# Patient Record
Sex: Male | Born: 1975 | Race: Black or African American | Hispanic: No | Marital: Single | Smoking: Never smoker
Health system: Southern US, Community
[De-identification: ages and names within clinical notes are randomized; demographics above are authoritative.]

## PROBLEM LIST (undated history)

## (undated) DIAGNOSIS — T7840XA Allergy, unspecified, initial encounter: Secondary | ICD-10-CM

## (undated) DIAGNOSIS — E119 Type 2 diabetes mellitus without complications: Secondary | ICD-10-CM

## (undated) DIAGNOSIS — F329 Major depressive disorder, single episode, unspecified: Secondary | ICD-10-CM

## (undated) DIAGNOSIS — F32A Depression, unspecified: Secondary | ICD-10-CM

## (undated) DIAGNOSIS — B20 Human immunodeficiency virus [HIV] disease: Secondary | ICD-10-CM

## (undated) DIAGNOSIS — D849 Immunodeficiency, unspecified: Secondary | ICD-10-CM

## (undated) HISTORY — PX: OTHER SURGICAL HISTORY: SHX169

## (undated) HISTORY — DX: Type 2 diabetes mellitus without complications: E11.9

## (undated) HISTORY — DX: Allergy, unspecified, initial encounter: T78.40XA

## (undated) HISTORY — DX: Major depressive disorder, single episode, unspecified: F32.9

## (undated) HISTORY — PX: GASTRIC ROUX-EN-Y: SHX5262

## (undated) HISTORY — DX: Depression, unspecified: F32.A

---

## 2003-02-27 ENCOUNTER — Emergency Department (HOSPITAL_COMMUNITY): Admission: EM | Admit: 2003-02-27 | Discharge: 2003-02-27 | Payer: Self-pay | Admitting: Emergency Medicine

## 2003-11-15 ENCOUNTER — Inpatient Hospital Stay (HOSPITAL_COMMUNITY): Admission: EM | Admit: 2003-11-15 | Discharge: 2003-11-19 | Payer: Self-pay | Admitting: Emergency Medicine

## 2003-11-17 ENCOUNTER — Encounter (INDEPENDENT_AMBULATORY_CARE_PROVIDER_SITE_OTHER): Payer: Self-pay | Admitting: *Deleted

## 2003-11-17 LAB — CONVERTED CEMR LAB
CD4 Count: 620 microliters
CD4 T Cell Abs: 620

## 2003-11-21 ENCOUNTER — Encounter: Admission: RE | Admit: 2003-11-21 | Discharge: 2003-11-21 | Payer: Self-pay | Admitting: Internal Medicine

## 2003-12-09 ENCOUNTER — Ambulatory Visit: Payer: Self-pay | Admitting: Internal Medicine

## 2003-12-09 ENCOUNTER — Ambulatory Visit (HOSPITAL_COMMUNITY): Admission: RE | Admit: 2003-12-09 | Discharge: 2003-12-09 | Payer: Self-pay | Admitting: Internal Medicine

## 2004-04-22 ENCOUNTER — Ambulatory Visit (HOSPITAL_COMMUNITY): Admission: RE | Admit: 2004-04-22 | Discharge: 2004-04-22 | Payer: Self-pay | Admitting: Internal Medicine

## 2004-04-22 ENCOUNTER — Ambulatory Visit: Payer: Self-pay | Admitting: Internal Medicine

## 2004-07-13 ENCOUNTER — Ambulatory Visit (HOSPITAL_COMMUNITY): Admission: RE | Admit: 2004-07-13 | Discharge: 2004-07-13 | Payer: Self-pay | Admitting: Internal Medicine

## 2004-07-13 ENCOUNTER — Ambulatory Visit: Payer: Self-pay | Admitting: Internal Medicine

## 2004-08-04 ENCOUNTER — Ambulatory Visit: Payer: Self-pay | Admitting: Internal Medicine

## 2004-10-12 ENCOUNTER — Ambulatory Visit: Payer: Self-pay | Admitting: Internal Medicine

## 2004-10-12 ENCOUNTER — Ambulatory Visit (HOSPITAL_COMMUNITY): Admission: RE | Admit: 2004-10-12 | Discharge: 2004-10-12 | Payer: Self-pay | Admitting: Internal Medicine

## 2004-10-27 ENCOUNTER — Ambulatory Visit: Payer: Self-pay | Admitting: Internal Medicine

## 2005-02-09 ENCOUNTER — Ambulatory Visit (HOSPITAL_COMMUNITY): Admission: RE | Admit: 2005-02-09 | Discharge: 2005-02-09 | Payer: Self-pay | Admitting: Internal Medicine

## 2005-02-09 ENCOUNTER — Ambulatory Visit: Payer: Self-pay | Admitting: Internal Medicine

## 2005-02-23 ENCOUNTER — Ambulatory Visit: Payer: Self-pay | Admitting: Internal Medicine

## 2005-06-15 ENCOUNTER — Encounter: Admission: RE | Admit: 2005-06-15 | Discharge: 2005-06-15 | Payer: Self-pay | Admitting: Internal Medicine

## 2005-06-15 ENCOUNTER — Ambulatory Visit: Payer: Self-pay | Admitting: Internal Medicine

## 2005-06-15 ENCOUNTER — Encounter (INDEPENDENT_AMBULATORY_CARE_PROVIDER_SITE_OTHER): Payer: Self-pay | Admitting: *Deleted

## 2005-07-06 ENCOUNTER — Ambulatory Visit: Payer: Self-pay | Admitting: Internal Medicine

## 2006-01-05 ENCOUNTER — Emergency Department (HOSPITAL_COMMUNITY): Admission: EM | Admit: 2006-01-05 | Discharge: 2006-01-05 | Payer: Self-pay | Admitting: Emergency Medicine

## 2006-01-11 ENCOUNTER — Encounter (INDEPENDENT_AMBULATORY_CARE_PROVIDER_SITE_OTHER): Payer: Self-pay | Admitting: *Deleted

## 2006-01-11 ENCOUNTER — Encounter: Admission: RE | Admit: 2006-01-11 | Discharge: 2006-01-11 | Payer: Self-pay | Admitting: Internal Medicine

## 2006-01-11 ENCOUNTER — Ambulatory Visit: Payer: Self-pay | Admitting: Internal Medicine

## 2006-01-11 ENCOUNTER — Emergency Department (HOSPITAL_COMMUNITY): Admission: EM | Admit: 2006-01-11 | Discharge: 2006-01-11 | Payer: Self-pay | Admitting: Family Medicine

## 2006-01-11 LAB — CONVERTED CEMR LAB
CD4 Count: 770 microliters
HIV 1 RNA Quant: 1030 copies/mL
HIV-1 RNA Quant, Log: 3.01 — ABNORMAL HIGH (ref <1.70)

## 2006-04-26 ENCOUNTER — Ambulatory Visit: Payer: Self-pay | Admitting: Internal Medicine

## 2006-04-26 ENCOUNTER — Encounter: Admission: RE | Admit: 2006-04-26 | Discharge: 2006-04-26 | Payer: Self-pay | Admitting: Internal Medicine

## 2006-04-26 ENCOUNTER — Encounter (INDEPENDENT_AMBULATORY_CARE_PROVIDER_SITE_OTHER): Payer: Self-pay | Admitting: *Deleted

## 2006-04-26 DIAGNOSIS — B20 Human immunodeficiency virus [HIV] disease: Secondary | ICD-10-CM

## 2006-04-26 DIAGNOSIS — A528 Late syphilis, latent: Secondary | ICD-10-CM

## 2006-04-26 DIAGNOSIS — J309 Allergic rhinitis, unspecified: Secondary | ICD-10-CM

## 2006-04-26 DIAGNOSIS — F640 Transsexualism: Secondary | ICD-10-CM

## 2006-04-26 DIAGNOSIS — F329 Major depressive disorder, single episode, unspecified: Secondary | ICD-10-CM

## 2006-04-26 DIAGNOSIS — R590 Localized enlarged lymph nodes: Secondary | ICD-10-CM | POA: Insufficient documentation

## 2006-04-26 LAB — CONVERTED CEMR LAB
AST: 31 units/L (ref 0–37)
BUN: 12 mg/dL (ref 6–23)
CO2: 27 meq/L (ref 19–32)
Calcium: 8.8 mg/dL (ref 8.4–10.5)
Chloride: 102 meq/L (ref 96–112)
Creatinine, Ser: 1.25 mg/dL (ref 0.40–1.50)
Glucose, Bld: 118 mg/dL — ABNORMAL HIGH (ref 70–99)
HCT: 45 % (ref 39.0–52.0)
HIV 1 RNA Quant: 947 copies/mL — ABNORMAL HIGH (ref <50)
HIV-1 RNA Quant, Log: 2.98 — ABNORMAL HIGH (ref <1.70)
Hemoglobin: 15 g/dL (ref 13.0–17.0)
RDW: 13.9 % (ref 11.5–14.0)

## 2006-05-10 ENCOUNTER — Encounter: Payer: Self-pay | Admitting: Internal Medicine

## 2006-05-23 ENCOUNTER — Encounter (INDEPENDENT_AMBULATORY_CARE_PROVIDER_SITE_OTHER): Payer: Self-pay | Admitting: *Deleted

## 2006-05-23 LAB — CONVERTED CEMR LAB

## 2006-06-05 ENCOUNTER — Encounter (INDEPENDENT_AMBULATORY_CARE_PROVIDER_SITE_OTHER): Payer: Self-pay | Admitting: *Deleted

## 2006-10-03 ENCOUNTER — Ambulatory Visit: Payer: Self-pay | Admitting: Internal Medicine

## 2006-10-03 ENCOUNTER — Encounter: Admission: RE | Admit: 2006-10-03 | Discharge: 2006-10-03 | Payer: Self-pay | Admitting: Internal Medicine

## 2006-10-03 LAB — CONVERTED CEMR LAB
ALT: 9 units/L (ref 0–53)
Albumin: 3.3 g/dL — ABNORMAL LOW (ref 3.5–5.2)
Alkaline Phosphatase: 74 units/L (ref 39–117)
Basophils Absolute: 0 10*3/uL (ref 0.0–0.1)
CO2: 24 meq/L (ref 19–32)
Glucose, Bld: 96 mg/dL (ref 70–99)
HIV-1 RNA Quant, Log: 3.18 — ABNORMAL HIGH (ref <1.70)
Lymphocytes Relative: 57 % — ABNORMAL HIGH (ref 12–46)
Lymphs Abs: 4.1 10*3/uL — ABNORMAL HIGH (ref 0.7–3.3)
Neutrophils Relative %: 31 % — ABNORMAL LOW (ref 43–77)
Platelets: 316 10*3/uL (ref 150–400)
Potassium: 4.6 meq/L (ref 3.5–5.3)
Sodium: 139 meq/L (ref 135–145)
Total Protein: 8.1 g/dL (ref 6.0–8.3)
WBC: 7.2 10*3/uL (ref 4.0–10.5)

## 2006-10-04 ENCOUNTER — Encounter: Payer: Self-pay | Admitting: Internal Medicine

## 2007-03-28 ENCOUNTER — Encounter: Payer: Self-pay | Admitting: Internal Medicine

## 2007-05-03 ENCOUNTER — Encounter (INDEPENDENT_AMBULATORY_CARE_PROVIDER_SITE_OTHER): Payer: Self-pay | Admitting: *Deleted

## 2007-10-24 ENCOUNTER — Encounter: Admission: RE | Admit: 2007-10-24 | Discharge: 2007-10-24 | Payer: Self-pay | Admitting: Internal Medicine

## 2007-10-24 ENCOUNTER — Ambulatory Visit: Payer: Self-pay | Admitting: Internal Medicine

## 2007-10-24 LAB — CONVERTED CEMR LAB
ALT: 12 units/L (ref 0–53)
AST: 35 units/L (ref 0–37)
Alkaline Phosphatase: 71 units/L (ref 39–117)
BUN: 13 mg/dL (ref 6–23)
Basophils Absolute: 0 10*3/uL (ref 0.0–0.1)
Basophils Relative: 0 % (ref 0–1)
Chloride: 103 meq/L (ref 96–112)
Creatinine, Ser: 1.13 mg/dL (ref 0.40–1.50)
Eosinophils Absolute: 0.1 10*3/uL (ref 0.0–0.7)
Eosinophils Relative: 1 % (ref 0–5)
Hemoglobin: 15.3 g/dL (ref 13.0–17.0)
MCHC: 35.3 g/dL (ref 30.0–36.0)
MCV: 91 fL (ref 78.0–100.0)
Monocytes Absolute: 0.7 10*3/uL (ref 0.1–1.0)
Monocytes Relative: 10 % (ref 3–12)
Neutro Abs: 2.2 10*3/uL (ref 1.7–7.7)
RDW: 13.9 % (ref 11.5–15.5)
Total Bilirubin: 0.4 mg/dL (ref 0.3–1.2)

## 2007-11-07 ENCOUNTER — Ambulatory Visit: Payer: Self-pay | Admitting: Internal Medicine

## 2007-11-07 LAB — CONVERTED CEMR LAB: Hep B S Ab: POSITIVE — AB

## 2008-04-25 ENCOUNTER — Ambulatory Visit: Payer: Self-pay | Admitting: Internal Medicine

## 2008-04-25 LAB — CONVERTED CEMR LAB
ALT: 11 units/L (ref 0–53)
Basophils Absolute: 0 10*3/uL (ref 0.0–0.1)
CO2: 24 meq/L (ref 19–32)
Calcium: 8.6 mg/dL (ref 8.4–10.5)
Chloride: 103 meq/L (ref 96–112)
Creatinine, Ser: 1.13 mg/dL (ref 0.40–1.50)
Eosinophils Relative: 1 % (ref 0–5)
Glucose, Bld: 98 mg/dL (ref 70–99)
HCT: 43.7 % (ref 39.0–52.0)
HIV 1 RNA Quant: 3840 copies/mL — ABNORMAL HIGH (ref <48)
Hemoglobin: 14.4 g/dL (ref 13.0–17.0)
Lymphocytes Relative: 53 % — ABNORMAL HIGH (ref 12–46)
Lymphs Abs: 3.9 10*3/uL (ref 0.7–4.0)
Monocytes Absolute: 0.8 10*3/uL (ref 0.1–1.0)
Neutro Abs: 2.5 10*3/uL (ref 1.7–7.7)
RBC: 4.71 M/uL (ref 4.22–5.81)
RDW: 14.2 % (ref 11.5–15.5)
Total Bilirubin: 0.3 mg/dL (ref 0.3–1.2)
Total Protein: 8.1 g/dL (ref 6.0–8.3)
WBC: 7.3 10*3/uL (ref 4.0–10.5)

## 2008-05-09 ENCOUNTER — Ambulatory Visit: Payer: Self-pay | Admitting: Internal Medicine

## 2008-05-15 ENCOUNTER — Encounter: Payer: Self-pay | Admitting: Internal Medicine

## 2008-08-07 ENCOUNTER — Ambulatory Visit: Payer: Self-pay | Admitting: Internal Medicine

## 2008-08-10 ENCOUNTER — Emergency Department (HOSPITAL_COMMUNITY): Admission: EM | Admit: 2008-08-10 | Discharge: 2008-08-10 | Payer: Self-pay | Admitting: Emergency Medicine

## 2008-08-14 ENCOUNTER — Emergency Department (HOSPITAL_COMMUNITY): Admission: EM | Admit: 2008-08-14 | Discharge: 2008-08-14 | Payer: Self-pay | Admitting: Emergency Medicine

## 2008-08-20 ENCOUNTER — Telehealth (INDEPENDENT_AMBULATORY_CARE_PROVIDER_SITE_OTHER): Payer: Self-pay | Admitting: *Deleted

## 2008-08-25 ENCOUNTER — Emergency Department (HOSPITAL_COMMUNITY): Admission: EM | Admit: 2008-08-25 | Discharge: 2008-08-25 | Payer: Self-pay | Admitting: Emergency Medicine

## 2008-08-27 ENCOUNTER — Ambulatory Visit: Payer: Self-pay | Admitting: Internal Medicine

## 2008-08-27 DIAGNOSIS — K130 Diseases of lips: Secondary | ICD-10-CM | POA: Insufficient documentation

## 2008-08-28 ENCOUNTER — Encounter: Payer: Self-pay | Admitting: Internal Medicine

## 2008-09-03 ENCOUNTER — Encounter: Payer: Self-pay | Admitting: Internal Medicine

## 2008-09-16 ENCOUNTER — Encounter: Payer: Self-pay | Admitting: Internal Medicine

## 2008-10-04 ENCOUNTER — Encounter: Payer: Self-pay | Admitting: Internal Medicine

## 2008-11-01 ENCOUNTER — Encounter: Payer: Self-pay | Admitting: Internal Medicine

## 2008-11-18 ENCOUNTER — Encounter (INDEPENDENT_AMBULATORY_CARE_PROVIDER_SITE_OTHER): Payer: Self-pay | Admitting: Otolaryngology

## 2008-11-18 ENCOUNTER — Ambulatory Visit (HOSPITAL_BASED_OUTPATIENT_CLINIC_OR_DEPARTMENT_OTHER): Admission: RE | Admit: 2008-11-18 | Discharge: 2008-11-18 | Payer: Self-pay | Admitting: Otolaryngology

## 2008-11-27 ENCOUNTER — Encounter: Payer: Self-pay | Admitting: Internal Medicine

## 2009-02-17 ENCOUNTER — Encounter: Payer: Self-pay | Admitting: Internal Medicine

## 2009-02-25 ENCOUNTER — Ambulatory Visit: Payer: Self-pay | Admitting: Internal Medicine

## 2009-04-22 ENCOUNTER — Ambulatory Visit: Payer: Self-pay | Admitting: Internal Medicine

## 2009-09-22 ENCOUNTER — Encounter: Payer: Self-pay | Admitting: Internal Medicine

## 2009-09-22 ENCOUNTER — Ambulatory Visit: Payer: Self-pay | Admitting: Internal Medicine

## 2009-09-22 LAB — CONVERTED CEMR LAB
HIV 1 RNA Quant: 767 copies/mL — ABNORMAL HIGH (ref <48)
HIV-1 RNA Quant, Log: 2.88 — ABNORMAL HIGH (ref <1.68)

## 2009-10-14 ENCOUNTER — Ambulatory Visit: Payer: Self-pay | Admitting: Internal Medicine

## 2009-10-14 ENCOUNTER — Encounter: Payer: Self-pay | Admitting: Internal Medicine

## 2009-10-14 DIAGNOSIS — R21 Rash and other nonspecific skin eruption: Secondary | ICD-10-CM

## 2009-10-14 DIAGNOSIS — D239 Other benign neoplasm of skin, unspecified: Secondary | ICD-10-CM | POA: Insufficient documentation

## 2009-10-17 ENCOUNTER — Encounter: Payer: Self-pay | Admitting: Internal Medicine

## 2009-10-27 ENCOUNTER — Encounter: Payer: Self-pay | Admitting: Internal Medicine

## 2009-11-17 ENCOUNTER — Telehealth: Payer: Self-pay | Admitting: Internal Medicine

## 2010-03-10 ENCOUNTER — Encounter: Payer: Self-pay | Admitting: Internal Medicine

## 2010-04-09 ENCOUNTER — Ambulatory Visit: Admit: 2010-04-09 | Payer: Self-pay | Admitting: Internal Medicine
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
## 2010-04-28 NOTE — Miscellaneous (Signed)
[facility]: [facility]   Imported By: Shon Hough 10/28/2009 15:29:57  _____________________________________________________________________  External Attachment:    Type:   Image     Comment:   External Document

## 2010-04-28 NOTE — Progress Notes (Signed)
Summary: rash/TY  Phone Note Call from Patient   Caller: Patient Call For: Dr. Cliffton Asters Summary of Call: Patient called stating that he has rash in groin area, itching and redness. Patient states he was seen for this before but symptoms has worsened. Initial call taken by: Starleen Arms CMA,  November 17, 2009 10:40 AM  Follow-up for Phone Call        Please ask him if the rash improved with Tinactin last month. If so then have him restart it. Follow-up by: Cliffton Asters MD,  November 18, 2009 9:14 AM

## 2010-04-28 NOTE — Assessment & Plan Note (Signed)
Summary: F/U/VS   CC:  follow-up visit and to see Research RN about START.  History of Present Illness: Robert Dawson is in for her routine visit.  She says that she has been bothered by the mole under her right breast and feels like it has gotten larger. It becomes irritated by her bra. She has also been bothered by an itchy rash on her scrotum for the last 3 weeks.  She says that she's never had anything like that before.  She is sexually active but only has one stable partner and says they always use condoms.  Preventive Screening-Counseling & Management  Alcohol-Tobacco     Alcohol drinks/day: 0     Smoking Status: never     Passive Smoke Exposure: yes  Caffeine-Diet-Exercise     Caffeine use/day: yes     Does Patient Exercise: no  Hep-HIV-STD-Contraception     HIV Risk: no risk noted  Safety-Violence-Falls     Seat Belt Use: yes  Comments: given condoms      Sexual History:  currently monogamous.        Drug Use:  never.     Prior Medication List:  TRAZODONE HCL 100 MG  TABS (TRAZODONE HCL) Take 1 tablet by mouth at bedtime ZOLOFT 100 MG TABS (SERTRALINE HCL) Take 1 tablet by mouth once a day ABILIFY 10 MG TABS (ARIPIPRAZOLE) Take 1 tablet by mouth once a day   Current Allergies (reviewed today): No known allergies  Social History: Sexual History:  currently monogamous  Vital Signs:  Patient profile:   35 year old male Height:      69 inches (175.26 cm) Weight:      260 pounds (118.18 kg) BMI:     38.53 Temp:     98.4 degrees F oral Pulse rate:   86 / minute BP sitting:   129 / 85  (left arm) Cuff size:   large  Vitals Entered By: Jennet Maduro RN (October 14, 2009 9:39 AM) CC: follow-up visit, to see Research RN about START Is Patient Diabetic? No Pain Assessment Patient in pain? no      Nutritional Status BMI of > 30 = obese Nutritional Status Detail appetite "OK"  Have you ever been in a relationship where you felt threatened, hurt or  afraid?No   Does patient need assistance? Functional Status Self care Ambulation Normal   Physical Exam  General:  alert and overweight-appearing.   Mouth:  pharynx pink and moist, no erythema, no exudates, and fair dentition.   Breasts:  she has a raised fleshy dark mole about 10 mm in diameter under her right breast.  There is a small punctate satellite lesion at the one o'clock position.  I am uncertain if this has changed in size. Lungs:  normal breath sounds, no crackles, and no wheezes.   Heart:  normal rate, regular rhythm, and no murmur.   Abdomen:  soft, non-tender, and normal bowel sounds.  obese. Genitalia:  there are diffuse scrotal superficial ulcers that appear to have been excoriated.  They all seem to be in the same stage of evolution. Psych:  normally interactive, good eye contact, not anxious appearing, and not depressed appearing.             Prevention For Positives: 10/14/2009   Safe sex practices discussed with patient. Condoms offered.  Impression & Recommendations:  Problem # 1:  HIV DISEASE (ICD-042) She has asymptomatic HIV infection with a normal CD4 count and low viral load.  I will have her talk with Deirdre Evener our research coordinator about the start study. Diagnostics Reviewed:  HIV: HIV positive - not AIDS (11/07/2007)   CD4: 570 (09/23/2009)   WBC: 7.3 (04/25/2008)   Hgb: 14.4 (04/25/2008)   HCT: 43.7 (04/25/2008)   Platelets: 264 (04/25/2008) HIV-1 RNA: 767 (09/22/2009)   HBSAg: NO (05/23/2006)  Problem # 2:  SKIN RASH (ICD-782.1) I have obtained a swab culture of the ulcers today for herpes simplex but I suspect that this is probably not herpes given the fairly diffuse bilateral nature of the rash.  This may be tinea cruris and I will treat for that empirically pending the herpes culture. His updated medication list for this problem includes:    Tinactin 1 % Powd (Tolnaftate)  Orders: Est. Patient Level IV  (87564) Dermatology Referral (Derma) T-Culture, Herpes (Routine) ([id number])  Problem # 3:  MOLE (ICD-216.9) I will see if we can arrange a dermatology referral today to consider excisional biopsy of the mole. Orders: Est. Patient Level IV (66063) Dermatology Referral (Derma)  Medications Added to Medication List This Visit: 1)  Tinactin 1 % Powd (Tolnaftate)  Other Orders: Future Orders: T-CD4SP ([facility]) (CD4SP) ... 04/12/2010 T-HIV Viral Load [phone]) ... 04/12/2010 T-Comprehensive Metabolic Panel [phone]) ... 04/12/2010 T-CBC w/Diff ([id number]) ... 04/12/2010 T-RPR (Syphilis) [phone]) ... 04/12/2010 T-Lipid Profile [phone]) ... 04/12/2010  Patient Instructions: 1)  Please schedule a follow-up appointment in 6 months.

## 2010-04-30 NOTE — Miscellaneous (Signed)
  Clinical Lists Changes  Orders: Added new Test order of T-CBC w/Diff [phone]) - Signed Added new Test order of T-CD4SP [facility]) (CD4SP) - Signed Added new Test order of T-Comprehensive Metabolic Panel [phone]) - Signed Added new Test order of T-HIV Viral Load [phone]) - Signed     Process Orders Check Orders Results:     [facility]: Check successful Tests Sent for requisitioning (March 10, 2010 12:03 PM):     04/09/2010: [facility] -- T-CBC w/Diff [[id number]] (signed)     04/09/2010: [facility] -- T-Comprehensive Metabolic Panel [[id number]] (signed)     04/09/2010: [facility] -- T-HIV Viral Load [phone] (signed)

## 2010-06-04 ENCOUNTER — Encounter: Payer: Self-pay | Admitting: Internal Medicine

## 2010-06-09 ENCOUNTER — Encounter: Payer: Self-pay | Admitting: Internal Medicine

## 2010-06-14 LAB — T-HELPER CELL (CD4) - (RCID CLINIC ONLY): CD4 % Helper T Cell: 18 % — ABNORMAL LOW (ref 33–55)

## 2010-06-16 ENCOUNTER — Other Ambulatory Visit: Payer: Self-pay | Admitting: Internal Medicine

## 2010-06-16 ENCOUNTER — Other Ambulatory Visit: Payer: Medicare Other

## 2010-06-16 ENCOUNTER — Ambulatory Visit (INDEPENDENT_AMBULATORY_CARE_PROVIDER_SITE_OTHER): Payer: Medicare Other | Admitting: Internal Medicine

## 2010-06-16 DIAGNOSIS — Z23 Encounter for immunization: Secondary | ICD-10-CM

## 2010-06-16 DIAGNOSIS — B20 Human immunodeficiency virus [HIV] disease: Secondary | ICD-10-CM

## 2010-06-16 LAB — CBC WITH DIFFERENTIAL/PLATELET
Eosinophils Relative: 6 % — ABNORMAL HIGH (ref 0–5)
HCT: 39.2 % (ref 39.0–52.0)
Hemoglobin: 13.2 g/dL (ref 13.0–17.0)
Lymphocytes Relative: 40 % (ref 12–46)
Lymphs Abs: 3.2 10*3/uL (ref 0.7–4.0)
MCV: 90.7 fL (ref 78.0–100.0)
Monocytes Absolute: 0.7 10*3/uL (ref 0.1–1.0)
Monocytes Relative: 9 % (ref 3–12)
Platelets: 258 10*3/uL (ref 150–400)
RBC: 4.32 MIL/uL (ref 4.22–5.81)
WBC: 7.9 10*3/uL (ref 4.0–10.5)

## 2010-06-16 MED ORDER — INFLUENZA VAC TYP A&B SURF ANT IM INJ
0.5000 mL | INJECTION | Freq: Once | INTRAMUSCULAR | Status: AC
  Administered 2010-06-16: 0.5 mL via INTRAMUSCULAR

## 2010-06-17 LAB — T-HELPER CELL (CD4) - (RCID CLINIC ONLY)
CD4 % Helper T Cell: 21 % — ABNORMAL LOW (ref 33–55)
CD4 T Cell Abs: 680 uL (ref 400–2700)

## 2010-06-17 LAB — COMPLETE METABOLIC PANEL WITH GFR
ALT: 10 U/L (ref 0–53)
AST: 29 U/L (ref 0–37)
Alkaline Phosphatase: 77 U/L (ref 39–117)
BUN: 10 mg/dL (ref 6–23)
Creat: 1.03 mg/dL (ref 0.40–1.50)
Total Bilirubin: 0.3 mg/dL (ref 0.3–1.2)

## 2010-06-18 LAB — HIV-1 RNA QUANT-NO REFLEX-BLD
HIV 1 RNA Quant: 2440 copies/mL — ABNORMAL HIGH (ref <20)
HIV-1 RNA Quant, Log: 3.39 {Log} — ABNORMAL HIGH (ref <1.30)

## 2010-06-19 NOTE — Progress Notes (Signed)
PT here for vaccination only. Visit encounter created in error.

## 2010-06-30 ENCOUNTER — Ambulatory Visit: Payer: Self-pay | Admitting: Internal Medicine

## 2010-07-01 LAB — T-HELPER CELL (CD4) - (RCID CLINIC ONLY)
CD4 % Helper T Cell: 18 % — ABNORMAL LOW (ref 33–55)
CD4 T Cell Abs: 530 uL (ref 400–2700)

## 2010-07-04 LAB — POCT HEMOGLOBIN-HEMACUE: Hemoglobin: 14.8 g/dL (ref 13.0–17.0)

## 2010-08-11 NOTE — Op Note (Signed)
NAMESHOUA, RESSLER            ACCOUNT NO.:  [id number]   MEDICAL RECORD NO.:  [id number]          PATIENT TYPE:  AMB   LOCATION:  DSC                          FACILITY:  [facility]   PHYSICIAN:  Karol T. Lazarus Salines, M.D. DATE OF BIRTH:  November 20, 1975   DATE OF PROCEDURE:  11/18/2008  DATE OF DISCHARGE:                               OPERATIVE REPORT   PREOPERATIVE DIAGNOSIS:  Right lower lip foreign body granuloma.   POSTOPERATIVE DIAGNOSIS:  Right lower lip foreign body granuloma.   PROCEDURE PERFORMED:  Excision, right lower lip lesion with cosmetic  reconstruction.   SURGEON:  Gloris Manchester. Wolicki, MD   ANESTHESIA:  General nasotracheal.   BLOOD LOSS:  Minimal.   COMPLICATIONS:  None.   FINDINGS:  Irregular submucosal fullness beginning just behind the  vermilion of the lip and tracking down to the alveolus angling down  towards the mental nerve on the right side and with the area of firmness  approximately 5 cm long x 2 cm wide x 1 cm thick.  This was submucosal  but deep to the orbicularis oris muscle.   PROCEDURE:  With the patient in a comfortable supine position, general  nasotracheal anesthesia was induced without difficulty.  At an  appropriate level, the patient was placed in a slight sitting position.  The lesion was examined and 1% Xylocaine with 1:100,000 epinephrine, 9  mL total was infiltrated in a ring block fashion.  Several minutes were  allowed for this to take effect.  A sterile preparation and draping of  the mouth and neck was performed.   The lesion was again examined.  It was elected to wedge out part of the  visible vermilion of the lip and then to remove the submucosal lesion  with a 1-cm wide strip of mucosa down to its entire palpable extent.  The mucosa was incised, and the dissection was carried out lateral to  the lesion sharply.  The lesion was circumcised using cautery and sharp  scalpel with minimal margin but was removed in its entirety.  It was  passed off as a specimen.  A small amount of cautery was required for  hemostasis.  At this point, the V excision on the vermilion was enlarged  slightly to prevent too much of a dog ear.  Hemostasis was again  observed after irrigating the mouth and suctioning this clear.  The  closure was begun internally with 3-0 chromic taking care to grab some  the deep tissue to obliterate the dead space.  This was brought up onto  the anterior surface of the lip.  The external surface of the lip was  closed with interrupted 5-0 Ethilon in the visible portion.  Working  further posteriorly, the remainder of the vermilion was closed with  interrupted 4-0 chromic sutures in a vertical mattress fashion.  The  remainder of the internal incision was closed a 3-0 chromic in a  vertical mattress fashion.  A good reapproximation of the lip with  slight dog ear at the superior and anterior surface was accomplished.  Hemostasis was again observed.  The oral cavity was irrigated,  and the  pharynx was carefully suctioned clear.  At this point, the procedure was  completed.  The patient was returned to Anesthesia, awakened, extubated,  and transferred to recovery in stable condition.   COMMENT:  A 35 year old transgender male received liquid silicone  injections to enhance his chin and developed asymmetry and a hard  presumed foreign body granuloma/fibrosis of the right lower lip, hence  the indication for today's procedure.  Anticipate a routine  postoperative recovery with attention to ice, elevation, analgesia.      Gloris Manchester. Lazarus Salines, M.D.  Electronically Signed     KTW/MEDQ  D:  11/18/2008  T:  11/19/2008  Job:  [id number]   cc:   [facility].

## 2010-08-14 NOTE — Discharge Summary (Signed)
NAMEFREDERIC, Robert Dawson            ACCOUNT NO.:  [id number]   MEDICAL RECORD NO.:  [id number]          PATIENT TYPE:  INP   LOCATION:  0480                         FACILITY:  [facility]   PHYSICIAN:  Fleet Contras, M.D.    DATE OF BIRTH:  July 16, 1975   DATE OF ADMISSION:  11/15/2003  DATE OF DISCHARGE:  11/19/2003                                 DISCHARGE SUMMARY   PRESENTING COMPLAINT:  Urinary urgency, hesitancy, and dysuria for 4 days.   HISTORY OF PRESENTING COMPLAINT:  [name] is a pleasant 35 year old  gentleman, who presented to the emergency room at [facility] with  4 days of urinary urgency, hesitancy, and dysuria.  This had been  progressive, but he denied any fever or chills.  He did have some night  sweats and anorexia.  He has lost about 5 pounds of weight in the last few  months.  He had no abdominal pain, vomiting, diarrhea, or constipation.  He  denied any urethral discharge or scrotal pain.  On evaluation in the  emergency room, he was found to have multiple swollen inguinal lymph nodes,  and a CT scan of the abdomen and pelvis showed some intra-abdominal lymph  nodes and a liver lesion.  He could not pass urine.   PHYSICAL EXAMINATION:  GENERAL:  On examination, he was in some acute  painful distress.  He has palpable axillary small submandibular and matted  bilateral inguinal lymph nodes.  VITAL SIGNS:  His blood pressure is 107/63.  Heart rate is 96.  Temperature  98.1.  respiratory rate 18.  RECTAL:  Exquisitely tender prostate which was not enlarged with no  testicular masses.   Initial laboratory studies showed a white count of 11.4, hemoglobin 15,  platelet count 238.  He had 62% of lymphocytes and 24% of polys.  Peripheral  smear showed teardrops, __________ cells and __________ lymphocytes.  The  electrolytes showed a potassium of 4.0, sodium 133.  BUN was 10 and  creatinine 1.4.  His albumin was 3.0.  Urinalysis was positive for protein,  few  bacteria, and 0-2 WBC.  He was thought to have acute prostatitis, to  rule out some socially transmitted infection and also generalized  lymphadenopathy with lymphocytosis and atypical lymphocytes.  Further  testing included RPR which showed 1-2 and positive TPPA.  GC and Chlamydia  were negative.  LDH was elevated at 426.  An infectious disease consult was  requested, and patient was seen by Dr. Ninetta Lights, who recommended outpatient  treatment for syphilis with intramuscular penicillin-G 2.4 million units  weekly x 3 doses.  HIV test also came back positive.  For this, he was  counseled extensively.  A skin test for TB was negative.  Consideration was  given for lymph node biopsy prior to the report of HIV test results  received.  It was concluded that the lymphadenopathy was most likely due to  acute HIV infection, and lymph node biopsy was cancelled.   ADMISSION DIAGNOSES:  1.  Acute prostatitis/urinary tract infection.  2.  Generalized lymphadenopathy.  3.  Transvestite.   DISCHARGE DIAGNOSES:  1.  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  2.  Latent syphilis.  3.  Generalized lymphadenopathy.  4.  Transvestism.   CONDITION ON DISCHARGE:  Stable.   DISPOSITION:  To home.   FOLLOW UP:  With Dr. Fleet Contras, [phone] on November 25, 2003 and with  infectious disease clinic at [facility] in 1-2 weeks.  He is to  receive __________ penicillin 2.4 million units weekly x 2 more doses.      EA/MEDQ  D:  12/28/2003  T:  12/29/2003  Job:  [id number]

## 2010-08-31 ENCOUNTER — Ambulatory Visit (INDEPENDENT_AMBULATORY_CARE_PROVIDER_SITE_OTHER): Payer: Medicare Other | Admitting: Internal Medicine

## 2010-08-31 ENCOUNTER — Encounter: Payer: Self-pay | Admitting: Internal Medicine

## 2010-08-31 ENCOUNTER — Ambulatory Visit: Payer: Medicare Other | Admitting: Internal Medicine

## 2010-08-31 DIAGNOSIS — G629 Polyneuropathy, unspecified: Secondary | ICD-10-CM | POA: Insufficient documentation

## 2010-08-31 DIAGNOSIS — R21 Rash and other nonspecific skin eruption: Secondary | ICD-10-CM

## 2010-08-31 DIAGNOSIS — G609 Hereditary and idiopathic neuropathy, unspecified: Secondary | ICD-10-CM

## 2010-08-31 DIAGNOSIS — B20 Human immunodeficiency virus [HIV] disease: Secondary | ICD-10-CM

## 2010-08-31 MED ORDER — GABAPENTIN 100 MG PO CAPS: 100.0000 mg | ORAL_CAPSULE | Freq: Three times a day (TID) | ORAL | Status: DC

## 2010-08-31 NOTE — Assessment & Plan Note (Signed)
I am not absolutely sure what is causing her leg pain she does have a little bit of a chronic numbness and tingling. I have told her that it is very dangerous for her to share medications particularly prescription narcotics. I will try her on low-dose Neurontin to see if it helps.

## 2010-08-31 NOTE — Progress Notes (Signed)
  Subjective:    Patient ID: Robert Dawson, male    DOB: [birth year], 35 y.o.   MRN: [id number]  HPI Avenir history for her routine visit. She has not changed any of her medications. After her last visit she saw a local dermatologist and had the mole under her right breast removed. She cannot remember the dermatologist name. She believes it was benign. The scrotal rash resolved with topical antifungal therapy. She has recently developed several lesions on her left palm but feels like they're starting to go away.  For her since she had silicone injections in her hips she has had some discomfort and heaviness that she says effects her walking. She says she's tried over-the-counter pain medications without relief. She has taken whenever girlfriend is oxycodone tablets on occasion and says that makes her feel much better.  After her last visit she did talk to Deirdre Evener, prior study treatment coordinator, but did not feel that she was ready to start antiretroviral therapy. She says that she does think about starting it so that she doesn't wait until she gets sick but is still not sure she is ready to start.    Review of Systems     Objective:   Physical Exam  Constitutional: No distress.  HENT:  Mouth/Throat: Oropharynx is clear and moist. No oropharyngeal exudate.  Cardiovascular: Normal rate, regular rhythm and normal heart sounds.   No murmur heard. Pulmonary/Chest: Breath sounds normal. He has no wheezes.  Neurological:       Examination of her feet and lower legs is unremarkable. She has 3+ pulses in her feet. She has no swelling and no pain on palpation. Her gait is normal. Her sensation appears to be intact to light touch.  Skin:       There are 5 skin lesions clustered at the base of her left palm. They are raised, rough and firm and have the appearance of warts.          Assessment & Plan:

## 2010-08-31 NOTE — Assessment & Plan Note (Signed)
Her CD4 count remains normal and her viral load remains relatively low and stable. However I reviewed with her the recent data that suggests that treating everyone with HIV regardless of CD4 and viral load offers benefit. I talked to her about possible one pill regimens but she states that she is still not ready to start but will consider it.

## 2010-08-31 NOTE — Assessment & Plan Note (Signed)
I suggested that she return to see her dermatologist if the lesions on her left palm did not resolve over the next few weeks.

## 2010-12-25 LAB — T-HELPER CELL (CD4) - (RCID CLINIC ONLY): CD4 % Helper T Cell: 16 — ABNORMAL LOW

## 2010-12-30 ENCOUNTER — Other Ambulatory Visit: Payer: Self-pay | Admitting: Internal Medicine

## 2010-12-30 ENCOUNTER — Other Ambulatory Visit (INDEPENDENT_AMBULATORY_CARE_PROVIDER_SITE_OTHER): Payer: Medicare Other

## 2010-12-30 DIAGNOSIS — B20 Human immunodeficiency virus [HIV] disease: Secondary | ICD-10-CM

## 2010-12-31 LAB — T-HELPER CELL (CD4) - (RCID CLINIC ONLY)
CD4 % Helper T Cell: 16 % — ABNORMAL LOW (ref 33–55)
CD4 T Cell Abs: 720 uL (ref 400–2700)

## 2011-01-12 LAB — T-HELPER CELL (CD4) - (RCID CLINIC ONLY): CD4 T Cell Abs: 630

## 2011-01-14 ENCOUNTER — Encounter: Payer: Self-pay | Admitting: Internal Medicine

## 2011-01-14 ENCOUNTER — Ambulatory Visit (INDEPENDENT_AMBULATORY_CARE_PROVIDER_SITE_OTHER): Payer: Medicare Other | Admitting: Internal Medicine

## 2011-01-14 VITALS — BP 138/85 | HR 75 | Temp 98.7°F | Ht 69.0 in | Wt 288.0 lb

## 2011-01-14 DIAGNOSIS — B20 Human immunodeficiency virus [HIV] disease: Secondary | ICD-10-CM

## 2011-01-14 DIAGNOSIS — Z23 Encounter for immunization: Secondary | ICD-10-CM

## 2011-01-14 NOTE — Progress Notes (Signed)
  Subjective:    Patient ID: Robert Dawson, male    DOB: Sep 25, 1975, 35 y.o.   MRN: [id number]  HPI Robert Dawson is in for her routine visit. She did use topical wart therapy for the lesions on her left palm after her visit and they promptly resolved. She still has some mild leg pain but she believes this is related to the silicone injection she got years ago. She states that if she had a money she would have some of it removed. Her pain has not changed and is not particularly limiting to her activity. Other than that she is doing well. She has not changed any of her medications.    Review of Systems     Objective:   Physical Exam  Constitutional: No distress.       She remains overweight.  HENT:  Mouth/Throat: Oropharynx is clear and moist. No oropharyngeal exudate.  Cardiovascular: Normal heart sounds.   Pulmonary/Chest: Breath sounds normal.  Skin:       The warts on her left palm have resolved.  Psychiatric: He has a normal mood and affect.          Assessment & Plan:

## 2011-01-14 NOTE — Assessment & Plan Note (Signed)
Her CD4 count remains normal at 720 and her viral load remains relatively well at 678. I've reviewed with her, again, the rationale for starting antiretroviral therapy even when her numbers look relatively good. She still remains somewhat reluctant to start stating " am worried that the medication might make me sick". I have reassured her that if she had significant side effects of the regimen we could change to an alternative one and that I felt confident that I would be able to find a regimen she can take and tolerate and also one that would fully suppress her virus. She is certainly willing to consider this between now and her next visit. She did receive her influenza vaccine today.

## 2011-05-25 DIAGNOSIS — F339 Major depressive disorder, recurrent, unspecified: Secondary | ICD-10-CM | POA: Diagnosis not present

## 2011-07-26 ENCOUNTER — Other Ambulatory Visit: Payer: Medicare Other

## 2011-08-16 ENCOUNTER — Other Ambulatory Visit: Payer: Self-pay | Admitting: Internal Medicine

## 2011-08-16 DIAGNOSIS — B20 Human immunodeficiency virus [HIV] disease: Secondary | ICD-10-CM

## 2011-08-16 DIAGNOSIS — Z79899 Other long term (current) drug therapy: Secondary | ICD-10-CM

## 2011-08-16 DIAGNOSIS — Z113 Encounter for screening for infections with a predominantly sexual mode of transmission: Secondary | ICD-10-CM

## 2011-08-17 ENCOUNTER — Other Ambulatory Visit: Payer: Self-pay | Admitting: Internal Medicine

## 2011-08-17 ENCOUNTER — Other Ambulatory Visit (HOSPITAL_COMMUNITY)
Admission: RE | Admit: 2011-08-17 | Discharge: 2011-08-17 | Disposition: A | Discharge status: Home / Self Care | Payer: Medicare Other | Source: Ambulatory Visit | Attending: Internal Medicine | Admitting: Internal Medicine

## 2011-08-17 ENCOUNTER — Other Ambulatory Visit: Payer: Medicare Other

## 2011-08-17 DIAGNOSIS — Z113 Encounter for screening for infections with a predominantly sexual mode of transmission: Secondary | ICD-10-CM

## 2011-08-17 DIAGNOSIS — Z79899 Other long term (current) drug therapy: Secondary | ICD-10-CM | POA: Diagnosis not present

## 2011-08-17 DIAGNOSIS — B20 Human immunodeficiency virus [HIV] disease: Secondary | ICD-10-CM

## 2011-08-18 LAB — CBC WITH DIFFERENTIAL/PLATELET
Basophils Absolute: 0 10*3/uL (ref 0.0–0.1)
Eosinophils Absolute: 0.1 10*3/uL (ref 0.0–0.7)
Eosinophils Relative: 1 % (ref 0–5)
Lymphocytes Relative: 44 % (ref 12–46)
MCH: 30.9 pg (ref 26.0–34.0)
MCV: 91.8 fL (ref 78.0–100.0)
Platelets: 277 10*3/uL (ref 150–400)
RDW: 14.9 % (ref 11.5–15.5)
WBC: 6.9 10*3/uL (ref 4.0–10.5)

## 2011-08-18 LAB — COMPREHENSIVE METABOLIC PANEL
ALT: 15 U/L (ref 0–53)
AST: 39 U/L — ABNORMAL HIGH (ref 0–37)
Creat: 1.13 mg/dL (ref 0.50–1.35)
Total Bilirubin: 0.4 mg/dL (ref 0.3–1.2)

## 2011-08-18 LAB — LIPID PANEL
HDL: 28 mg/dL — ABNORMAL LOW (ref >39)
LDL Cholesterol: 78 mg/dL (ref 0–99)
Total CHOL/HDL Ratio: 4.3 Ratio
Triglycerides: 77 mg/dL (ref <150)
VLDL: 15 mg/dL (ref 0–40)

## 2011-08-19 ENCOUNTER — Ambulatory Visit: Payer: Medicare Other | Admitting: Internal Medicine

## 2011-08-31 ENCOUNTER — Ambulatory Visit: Payer: Medicare Other | Admitting: Internal Medicine

## 2011-11-17 ENCOUNTER — Ambulatory Visit (INDEPENDENT_AMBULATORY_CARE_PROVIDER_SITE_OTHER): Payer: Medicare Other | Admitting: Internal Medicine

## 2011-11-17 ENCOUNTER — Encounter: Payer: Self-pay | Admitting: Internal Medicine

## 2011-11-17 VITALS — BP 148/90 | HR 88 | Temp 98.3°F | Ht 68.0 in | Wt 293.0 lb

## 2011-11-17 DIAGNOSIS — B20 Human immunodeficiency virus [HIV] disease: Secondary | ICD-10-CM | POA: Diagnosis not present

## 2011-11-17 DIAGNOSIS — A539 Syphilis, unspecified: Secondary | ICD-10-CM | POA: Diagnosis not present

## 2011-11-17 MED ORDER — PENICILLIN G BENZATHINE 1200000 UNIT/2ML IM SUSP
1.2000 10*6.[IU] | INTRAMUSCULAR | Status: AC
  Administered 2011-11-17 – 2011-12-01 (×2): 1.2 10*6.[IU] via INTRAMUSCULAR

## 2011-11-17 MED ORDER — EFAVIRENZ-EMTRICITAB-TENOFOVIR 600-200-300 MG PO TABS: 1.0000 | ORAL_TABLET | Freq: Every day | ORAL | Status: DC

## 2011-11-17 NOTE — Progress Notes (Signed)
Patient ID: Robert [facility], male   DOB: [birth year], 36 y.o.   MRN: [id number]     [facility] for Infectious Disease  Patient Active Problem List  Diagnosis  . HIV DISEASE  . SYPHILIS, LATE, LATENT  . MOLE  . MORBID OBESITY  . GENDER IDENTITY DISORDER  . DEPRESSION  . ALLERGIC RHINITIS  . DISEASES OF LIPS  . SKIN RASH  . LYMPHADENOPATHY  . Peripheral neuropathy    Patient's Medications  New Prescriptions   No medications on file  Previous Medications   ARIPIPRAZOLE (ABILIFY) 10 MG TABLET    Take 10 mg by mouth daily.     GABAPENTIN (NEURONTIN) 100 MG CAPSULE    Take 1 capsule (100 mg total) by mouth 3 (three) times daily.   SERTRALINE (ZOLOFT) 100 MG TABLET    Take 100 mg by mouth daily.     TRAZODONE (DESYREL) 100 MG TABLET    Take 100 mg by mouth at bedtime.    Modified Medications   No medications on file  Discontinued Medications   No medications on file    Subjective: [facility] is in for her routine visit. She states that when she was 37 years old she had her sperm frozen and recently decided to engage with a surrogate mother and have a child. He now has a 42-month-old girl named Geographical information systems officer. She also admits that after her last visit here in November she reconnected with her ex-boyfriend and had sexual intercourse on 3 occasions. She denies any other sexual partners in the past year. She has not had any general lesions, rash, fever or other new problems. She states that she wants my opinion as to whether or not she should start antiretroviral therapy.  Objective: Temp: 98.3 F (36.8 C) (08/21 1508) Temp src: Oral (08/21 1508)  General: She is in good spirits Skin: No rash or palpable adenopathy Lungs: Clear Cor: Regular S1 and S2 and no murmurs Abdomen: Obese, soft and nontender  Lab Results HIV 1 RNA Quant (copies/mL)  Date Value  08/17/2011 854*  12/30/2010 678*  06/16/2010 2440*     CD4 T Cell Abs (cmm)  Date Value  08/17/2011 550   12/30/2010 720     06/16/2010 680      Assessment: Her HIV infection is stable. Her CD4 count remains normal but she does have low level viral activation chronically. I reviewed with her again the current recommendations for early intervention and treatment. I will check a genotype resistance testing and start her on Atripla. She states that she is committed to taking it faithfully.  Her RPR is positive at 1-16. Her last RPR was nonreactive in 2010. 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 A long discussion with her about the importance of partner selection and protection.  Plan: 1. Benzathine penicillin 7.2 million units IM over 3 weeks 2. HIV genotype resistance test 3. Start Atripla 4. Followup after lab work in 6 weeks   Cliffton Asters, MD [facility] for Infectious Disease [facility] [phone] pager   [phone] cell 11/17/2011, 3:35 PM

## 2011-11-18 ENCOUNTER — Other Ambulatory Visit: Payer: Self-pay | Admitting: Internal Medicine

## 2011-11-18 DIAGNOSIS — Z113 Encounter for screening for infections with a predominantly sexual mode of transmission: Secondary | ICD-10-CM

## 2011-11-18 DIAGNOSIS — B20 Human immunodeficiency virus [HIV] disease: Secondary | ICD-10-CM

## 2011-11-24 ENCOUNTER — Other Ambulatory Visit: Payer: Medicare Other

## 2011-11-24 ENCOUNTER — Ambulatory Visit (INDEPENDENT_AMBULATORY_CARE_PROVIDER_SITE_OTHER): Payer: Medicare Other | Admitting: *Deleted

## 2011-11-24 ENCOUNTER — Other Ambulatory Visit (HOSPITAL_COMMUNITY)
Admission: RE | Admit: 2011-11-24 | Discharge: 2011-11-24 | Disposition: A | Discharge status: Home / Self Care | Payer: Medicare Other | Source: Ambulatory Visit | Attending: Internal Medicine | Admitting: Internal Medicine

## 2011-11-24 DIAGNOSIS — B20 Human immunodeficiency virus [HIV] disease: Secondary | ICD-10-CM | POA: Diagnosis not present

## 2011-11-24 DIAGNOSIS — Z113 Encounter for screening for infections with a predominantly sexual mode of transmission: Secondary | ICD-10-CM

## 2011-11-24 DIAGNOSIS — A539 Syphilis, unspecified: Secondary | ICD-10-CM

## 2011-11-24 MED ORDER — PENICILLIN G BENZATHINE 1200000 UNIT/2ML IM SUSP
1.2000 10*6.[IU] | Freq: Once | INTRAMUSCULAR | Status: AC
  Administered 2011-11-24: 1.2 10*6.[IU] via INTRAMUSCULAR

## 2011-11-25 ENCOUNTER — Other Ambulatory Visit: Payer: Medicare Other

## 2011-12-01 ENCOUNTER — Ambulatory Visit (INDEPENDENT_AMBULATORY_CARE_PROVIDER_SITE_OTHER): Payer: Medicare Other | Admitting: *Deleted

## 2011-12-01 DIAGNOSIS — A528 Late syphilis, latent: Secondary | ICD-10-CM

## 2011-12-01 DIAGNOSIS — A539 Syphilis, unspecified: Secondary | ICD-10-CM | POA: Diagnosis not present

## 2011-12-07 LAB — HIV-1 INTEGRASE GENOTYPE

## 2011-12-17 DIAGNOSIS — F339 Major depressive disorder, recurrent, unspecified: Secondary | ICD-10-CM | POA: Diagnosis not present

## 2011-12-25 ENCOUNTER — Encounter (HOSPITAL_COMMUNITY): Payer: Self-pay

## 2011-12-25 ENCOUNTER — Emergency Department (HOSPITAL_COMMUNITY)
Admission: EM | Admit: 2011-12-25 | Discharge: 2011-12-25 | Disposition: A | Discharge status: Home / Self Care | Payer: Medicare Other | Attending: Emergency Medicine | Admitting: Emergency Medicine

## 2011-12-25 ENCOUNTER — Emergency Department (HOSPITAL_COMMUNITY): Payer: Medicare Other

## 2011-12-25 DIAGNOSIS — Z21 Asymptomatic human immunodeficiency virus [HIV] infection status: Secondary | ICD-10-CM | POA: Diagnosis not present

## 2011-12-25 DIAGNOSIS — R197 Diarrhea, unspecified: Secondary | ICD-10-CM | POA: Insufficient documentation

## 2011-12-25 DIAGNOSIS — B20 Human immunodeficiency virus [HIV] disease: Secondary | ICD-10-CM

## 2011-12-25 DIAGNOSIS — R82998 Other abnormal findings in urine: Secondary | ICD-10-CM | POA: Insufficient documentation

## 2011-12-25 DIAGNOSIS — Z8619 Personal history of other infectious and parasitic diseases: Secondary | ICD-10-CM | POA: Insufficient documentation

## 2011-12-25 DIAGNOSIS — R109 Unspecified abdominal pain: Secondary | ICD-10-CM | POA: Diagnosis not present

## 2011-12-25 DIAGNOSIS — D72829 Elevated white blood cell count, unspecified: Secondary | ICD-10-CM | POA: Diagnosis not present

## 2011-12-25 DIAGNOSIS — K921 Melena: Secondary | ICD-10-CM | POA: Diagnosis not present

## 2011-12-25 DIAGNOSIS — R102 Pelvic and perineal pain: Secondary | ICD-10-CM

## 2011-12-25 HISTORY — DX: Human immunodeficiency virus (HIV) disease: B20

## 2011-12-25 HISTORY — DX: Immunodeficiency, unspecified: D84.9

## 2011-12-25 LAB — URINALYSIS, ROUTINE W REFLEX MICROSCOPIC
Ketones, ur: NEGATIVE mg/dL
Nitrite: NEGATIVE
Specific Gravity, Urine: 1.02 (ref 1.005–1.030)
pH: 5.5 (ref 5.0–8.0)

## 2011-12-25 LAB — COMPREHENSIVE METABOLIC PANEL
ALT: 12 U/L (ref 0–53)
AST: 33 U/L (ref 0–37)
Albumin: 2.9 g/dL — ABNORMAL LOW (ref 3.5–5.2)
Alkaline Phosphatase: 148 U/L — ABNORMAL HIGH (ref 39–117)
BUN: 7 mg/dL (ref 6–23)
Chloride: 103 mEq/L (ref 96–112)
Potassium: 4.3 mEq/L (ref 3.5–5.1)
Sodium: 136 mEq/L (ref 135–145)
Total Bilirubin: 0.3 mg/dL (ref 0.3–1.2)

## 2011-12-25 LAB — CBC WITH DIFFERENTIAL/PLATELET
Basophils Absolute: 0 10*3/uL (ref 0.0–0.1)
Basophils Relative: 0 % (ref 0–1)
Hemoglobin: 16 g/dL (ref 13.0–17.0)
MCHC: 35.4 g/dL (ref 30.0–36.0)
Monocytes Relative: 7 % (ref 3–12)
Neutro Abs: 5.2 10*3/uL (ref 1.7–7.7)
Neutrophils Relative %: 47 % (ref 43–77)
RBC: 5.08 MIL/uL (ref 4.22–5.81)

## 2011-12-25 LAB — OCCULT BLOOD, POC DEVICE: Fecal Occult Bld: POSITIVE

## 2011-12-25 LAB — URINE MICROSCOPIC-ADD ON

## 2011-12-25 MED ORDER — IBUPROFEN 800 MG PO TABS
800.0000 mg | ORAL_TABLET | Freq: Once | ORAL | Status: AC
  Administered 2011-12-25: 800 mg via ORAL
  Filled 2011-12-25: qty 1

## 2011-12-25 MED ORDER — CEFTRIAXONE SODIUM 250 MG IJ SOLR
250.0000 mg | Freq: Once | INTRAMUSCULAR | Status: AC
  Administered 2011-12-25: 250 mg via INTRAMUSCULAR
  Filled 2011-12-25: qty 250

## 2011-12-25 MED ORDER — AZITHROMYCIN 1 G PO PACK
2.0000 g | PACK | Freq: Once | ORAL | Status: AC
  Administered 2011-12-25: 2 g via ORAL
  Filled 2011-12-25: qty 2

## 2011-12-25 NOTE — ED Provider Notes (Signed)
History     CSN: [id number]  Arrival date & time 12/25/11  1253   First MD Initiated Contact with Patient 12/25/11 1503      Chief Complaint  Patient presents with  . Abdominal Pain    (Consider location/radiation/quality/duration/timing/severity/associated sxs/prior treatment) HPI Robert Dawson is a 36 y.o. transgender, genetic male presenting to the emergency department with 3-4 days of lower abdominal, suprapubic pain is described as a burning, started 3-4 days ago has been constant and worsening, and is worse on palpation it is been associated with 3 to 4 black watery stools daily. Patient did take some Pepto-Bismol yesterday. He's had no nausea or vomiting, no fevers or chills. Eating does not exacerbate this problem or make it any better.  Patient also has HIV, and says his last viral load was undetectable. He denies any shortness of breath, productive cough, dizziness lightheadedness or chest pain. Patient also denies any pain on urination, dysuria or testicular pain.  Denies hernias or groin pain or swelling. Last bowel movement was this morning and it was watery and black.   Past Medical History  Diagnosis Date  . HIV disease   . Immune deficiency disorder     History reviewed. No pertinent past surgical history.  History reviewed. No pertinent family history.  History  Substance Use Topics  . Smoking status: Never Smoker   . Smokeless tobacco: Never Used  . Alcohol Use: No      Review of SystemsAt least 10pt or greater review of systems completed and are negative except where specified in the HPI.   Allergies  Review of patient's allergies indicates no known allergies.  Home Medications   Current Outpatient Rx  Name Route Sig Dispense Refill  . ARIPIPRAZOLE 10 MG PO TABS Oral Take 10 mg by mouth daily.      . EFAVIRENZ-EMTRICITAB-TENOFOVIR 600-200-300 MG PO TABS Oral Take 1 tablet by mouth at bedtime. 30 tablet 11  . GABAPENTIN 100 MG PO CAPS Oral Take  1 capsule (100 mg total) by mouth 3 (three) times daily. 90 capsule 2  . SERTRALINE HCL 100 MG PO TABS Oral Take 100 mg by mouth daily.      . TRAZODONE HCL 100 MG PO TABS Oral Take 100 mg by mouth at bedtime.        BP 129/88  Pulse 95  Temp 98.3 F (36.8 C) (Oral)  Resp 18  SpO2 96%  Physical Exam  Nursing notes reviewed.  Electronic medical record reviewed. VITAL SIGNS:   Filed Vitals:   12/25/11 1304  BP: 129/88  Pulse: 95  Temp: 98.3 F (36.8 C)  TempSrc: Oral  Resp: 18  SpO2: 96%   CONSTITUTIONAL: Awake, oriented, appears non-toxic HENT: Atraumatic, normocephalic, oral mucosa pink and moist, airway patent. Nares patent without drainage. External ears normal. EYES: Conjunctiva clear, EOMI, PERRLA NECK: Trachea midline, non-tender, supple CARDIOVASCULAR: Normal heart rate, Normal rhythm, No murmurs, rubs, gallops PULMONARY/CHEST: Clear to auscultation, no rhonchi, wheezes, or rales. Symmetrical breath sounds. Non-tender. ABDOMINAL: Non-distended, obese, soft, non-tender - no rebound or guarding.  BS normal. NEUROLOGIC: Non-focal, moving all four extremities, no gross sensory or motor deficits. EXTREMITIES: No clubbing, cyanosis, or edema RECTAL: Normal tone, small amount gross blood, no formed stool, black/brown liquid stool in vault. SKIN: Warm, Dry, No erythema, No rash, chest is shaved  ED Course  Procedures (including critical care time)  Labs Reviewed  URINALYSIS, ROUTINE W REFLEX MICROSCOPIC - Abnormal; Notable for the following:    Hgb  urine dipstick SMALL (*)     Leukocytes, UA TRACE (*)     All other components within normal limits  CBC WITH DIFFERENTIAL - Abnormal; Notable for the following:    WBC 11.1 (*)     Lymphs Abs 4.8 (*)     All other components within normal limits  COMPREHENSIVE METABOLIC PANEL - Abnormal; Notable for the following:    Total Protein 8.9 (*)     Albumin 2.9 (*)     Alkaline Phosphatase 148 (*)     GFR calc non Af Amer 74  (*)     GFR calc Af Amer 86 (*)     All other components within normal limits  LIPASE, BLOOD  URINE MICROSCOPIC-ADD ON  OCCULT BLOOD, POC DEVICE   Dg Abd 2 Views  12/25/2011  *RADIOLOGY REPORT*  Clinical Data: Low abdominal pain/diarrhea for 5 days.  ABDOMEN - 2 VIEW  Comparison: 11/15/2003 CT  Findings: Upright and supine views.  Upright view is mildly motion degraded. No free intraperitoneal air.  No significant air fluid levels.  A supine view demonstrates no bowel dilatation. Distal gas and stool.  No abnormal abdominal calcifications.   No appendicolith.  The upright view excludes the pelvis.  IMPRESSION: No acute findings.   Original Report Authenticated By: Consuello Bossier, M.D.      1. Suprapubic pain   2. Diarrhea   3. History of HIV infection   4. Blood in stool   5. Urine leukocytes     MDM  Robert Dawson is a 36 y.o. male presenting with suprapubic "burning" pain for the last 3-4 days which is been worsening and associated with diarrhea. Patient does have positive occult blood. Labs were ordered per triage protocol a relatively unremarkable. Patient does have 3-6 white cells in the urine a rare squamous epithelial and trace leukocyte Estrace this could represent cystitis versus urethritis. Swab the patient for gonorrhea Chlamydia. Patient admits to having sex with males and anal sex, his CBC is within normal limits, there is no anemia present.  There is no pain in the upper abdomen to suggest peptic ulcer disease or otherwise upper GI bleed. I think the occult blood and blood it is been seen over the last day is likely related to some local proctitis possibly from sexual intercourse, I think the black stools were likely due to taking Pepto-Bismol. Patient may have some mild viral enteritis causing the diarrhea for the last 3-4 days however he looks nontoxic, and well-hydrated.   Breasts labs are relatively unremarkable, he does have an elevated alkaline phosphatase at 140 however  this is just outside the range of normal - it is all located in the lower abdomen I do not think this represents blocked biliary system, cholecystis, cholelithiasi  We'll treat the patient for GC and chlamydia, she has been cautioned that if you would like further testing to return to the primary care provider. Has followup with primary care provider next Wednesday.  I explained the diagnosis and have given explicit precautions to return to the ER including abdominal pain, nausea vomiting, fever chills or any other new or worsening symptoms. The patient understands and accepts the medical plan as it's been dictated and I have answered their questions. Discharge instructions concerning home care and prescriptions have been given.  The patient is STABLE and is discharged to home in good condition.   Jones Skene, MD 12/25/11 1727

## 2011-12-25 NOTE — ED Notes (Signed)
Pt complains of abd pain and sts burning sensation with bowel movements and sts bowel movements are watery and darker than normal also reports pain with pressing on stomach and pt continually presses on stomach.

## 2011-12-28 LAB — GC/CHLAMYDIA PROBE AMP, GENITAL
Chlamydia, DNA Probe: NEGATIVE
GC Probe Amp, Genital: NEGATIVE

## 2011-12-29 ENCOUNTER — Other Ambulatory Visit: Payer: Medicare Other

## 2011-12-29 DIAGNOSIS — B20 Human immunodeficiency virus [HIV] disease: Secondary | ICD-10-CM

## 2011-12-29 LAB — CBC
MCHC: 34.2 g/dL (ref 30.0–36.0)
Platelets: 246 10*3/uL (ref 150–400)
RDW: 13.9 % (ref 11.5–15.5)
WBC: 12 10*3/uL — ABNORMAL HIGH (ref 4.0–10.5)

## 2011-12-30 LAB — COMPREHENSIVE METABOLIC PANEL
ALT: 18 U/L (ref 0–53)
AST: 34 U/L (ref 0–37)
Albumin: 3.5 g/dL (ref 3.5–5.2)
Alkaline Phosphatase: 128 U/L — ABNORMAL HIGH (ref 39–117)
Calcium: 9.6 mg/dL (ref 8.4–10.5)
Chloride: 103 mEq/L (ref 96–112)
Potassium: 4.3 mEq/L (ref 3.5–5.3)
Sodium: 135 mEq/L (ref 135–145)

## 2011-12-30 LAB — RPR TITER: RPR Titer: 1:8 {titer}

## 2011-12-30 LAB — T.PALLIDUM AB, TOTAL: T pallidum Antibodies (TP-PA): >8 S/CO — ABNORMAL HIGH (ref <0.90)

## 2012-01-12 ENCOUNTER — Ambulatory Visit: Payer: Medicare Other | Admitting: Internal Medicine

## 2012-01-17 ENCOUNTER — Ambulatory Visit: Payer: Medicare Other | Admitting: Internal Medicine

## 2012-01-17 ENCOUNTER — Telehealth: Payer: Self-pay | Admitting: *Deleted

## 2012-01-17 NOTE — Telephone Encounter (Signed)
Message left to call for a new appt w/ Dr. Orvan Falconer.

## 2012-01-24 ENCOUNTER — Ambulatory Visit: Payer: Medicare Other | Admitting: Internal Medicine

## 2012-03-15 DIAGNOSIS — F339 Major depressive disorder, recurrent, unspecified: Secondary | ICD-10-CM | POA: Diagnosis not present

## 2012-05-09 ENCOUNTER — Other Ambulatory Visit: Payer: Self-pay | Admitting: Licensed Clinical Social Worker

## 2012-05-09 DIAGNOSIS — B20 Human immunodeficiency virus [HIV] disease: Secondary | ICD-10-CM

## 2012-05-09 DIAGNOSIS — Z113 Encounter for screening for infections with a predominantly sexual mode of transmission: Secondary | ICD-10-CM

## 2012-05-12 ENCOUNTER — Other Ambulatory Visit: Payer: Medicare Other

## 2012-05-15 ENCOUNTER — Other Ambulatory Visit: Payer: Medicare Other

## 2012-05-15 DIAGNOSIS — B20 Human immunodeficiency virus [HIV] disease: Secondary | ICD-10-CM | POA: Diagnosis not present

## 2012-05-15 DIAGNOSIS — Z113 Encounter for screening for infections with a predominantly sexual mode of transmission: Secondary | ICD-10-CM | POA: Diagnosis not present

## 2012-05-15 LAB — CBC WITH DIFFERENTIAL/PLATELET
Basophils Absolute: 0 10*3/uL (ref 0.0–0.1)
Basophils Relative: 0 % (ref 0–1)
HCT: 43 % (ref 39.0–52.0)
Hemoglobin: 15 g/dL (ref 13.0–17.0)
Lymphocytes Relative: 48 % — ABNORMAL HIGH (ref 12–46)
MCHC: 34.9 g/dL (ref 30.0–36.0)
Monocytes Absolute: 0.8 10*3/uL (ref 0.1–1.0)
Monocytes Relative: 14 % — ABNORMAL HIGH (ref 3–12)
Neutro Abs: 2.2 10*3/uL (ref 1.7–7.7)
Neutrophils Relative %: 36 % — ABNORMAL LOW (ref 43–77)
RDW: 14.6 % (ref 11.5–15.5)
WBC: 6.2 10*3/uL (ref 4.0–10.5)

## 2012-05-15 LAB — COMPREHENSIVE METABOLIC PANEL
ALT: 22 U/L (ref 0–53)
AST: 47 U/L — ABNORMAL HIGH (ref 0–37)
Alkaline Phosphatase: 86 U/L (ref 39–117)
CO2: 28 mEq/L (ref 19–32)
Creat: 1.05 mg/dL (ref 0.50–1.35)
Sodium: 136 mEq/L (ref 135–145)
Total Bilirubin: 0.4 mg/dL (ref 0.3–1.2)
Total Protein: 8.4 g/dL — ABNORMAL HIGH (ref 6.0–8.3)

## 2012-05-15 LAB — RPR TITER: RPR Titer: 1:8 {titer}

## 2012-05-16 LAB — HIV-1 RNA QUANT-NO REFLEX-BLD
HIV 1 RNA Quant: 63 copies/mL — ABNORMAL HIGH (ref <20)
HIV-1 RNA Quant, Log: 1.8 {Log} — ABNORMAL HIGH (ref <1.30)

## 2012-05-16 LAB — T.PALLIDUM AB, TOTAL: T pallidum Antibodies (TP-PA): >8 S/CO — ABNORMAL HIGH (ref <0.90)

## 2012-07-12 ENCOUNTER — Encounter: Payer: Self-pay | Admitting: *Deleted

## 2012-07-17 NOTE — Telephone Encounter (Signed)
Error

## 2012-07-27 ENCOUNTER — Other Ambulatory Visit: Payer: Self-pay | Admitting: Internal Medicine

## 2012-07-27 ENCOUNTER — Encounter: Payer: Self-pay | Admitting: Internal Medicine

## 2012-07-27 ENCOUNTER — Ambulatory Visit (INDEPENDENT_AMBULATORY_CARE_PROVIDER_SITE_OTHER): Payer: Medicare Other | Admitting: Internal Medicine

## 2012-07-27 VITALS — BP 138/93 | HR 114 | Temp 98.4°F | Ht 68.0 in | Wt 297.5 lb

## 2012-07-27 DIAGNOSIS — B20 Human immunodeficiency virus [HIV] disease: Secondary | ICD-10-CM

## 2012-07-27 LAB — RPR: RPR Ser Ql: REACTIVE — AB

## 2012-07-27 NOTE — Progress Notes (Signed)
Patient ID: Robert Dawson, male   DOB: [birth year], 37 y.o.   MRN: [id number]          [facility] for Infectious Disease  Patient Active Problem List   Diagnosis Date Noted  . Peripheral neuropathy 08/31/2010  . MOLE 10/14/2009  . SKIN RASH 10/14/2009  . MORBID OBESITY 04/22/2009  . 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 HIV DISEASE 04/26/2006  . 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 DEPRESSION 04/26/2006  . ALLERGIC RHINITIS 04/26/2006  . LYMPHADENOPATHY 04/26/2006    Patient's Medications  New Prescriptions   No medications on file  Previous Medications   ARIPIPRAZOLE (ABILIFY) 10 MG TABLET    Take 10 mg by mouth daily.     SERTRALINE (ZOLOFT) 100 MG TABLET    Take 100 mg by mouth daily.     TRAZODONE (DESYREL) 100 MG TABLET    Take 100 mg by mouth at bedtime.    Modified Medications   No medications on file  Discontinued Medications   No medications on file    Subjective: Robert Dawson is in for her first visit since last October. She has her 25-month-old daughter, Robert [facility], with her. At the time of her last visit she decided to start on the Atripla. She says that she had no problem tolerating it. She did recall missing several doses in the evening when she forgot to take it. She says that after she ran out of the first of months supply she simply failed to get it refilled although she continued to go to the same pharmacy to get her monthly refills of Abilify, Zoloft and Desyrel. She says she is very forgetful.  She does not get any regular exercise. She believes that she has excess weight due to previous silicone injections. She says that she has a history of hypertension in her family and wonders if she needs to start on antihypertensive therapy.  She continues to be sexually active with her boyfriend and says that he does not use condoms routinely. She denies having any other partners since her last visit.  Objective: Temp: 98.4 F (36.9  C) (05/01 0922) Temp src: Oral (05/01 0922) BP: 138/93 mmHg (05/01 0922) Pulse Rate: 114 (05/01 0922)  General: Her body mass index is 45 Skin: Multiple tattoos but no rash Lungs: Clear Cor: Regular S1 and S2 no murmurs Abdomen: Obese, soft and nontender  Lab Results HIV 1 RNA Quant (copies/mL)  Date Value  05/15/2012 63*  12/29/2011 2907*  08/17/2011 854*     CD4 T Cell Abs (cmm)  Date Value  05/15/2012 610   12/29/2011 790   08/17/2011 550    RPR February 2014: 8   Assessment: She continues to have difficulties with her decision to start and stay on antiretroviral therapy. I will repeat her lab work today and have her back soon when she can have a visit with our pharmacist to review obstacles to adherence. She may do better on a medication she can take in the morning. She has not been using a pill box for her other medications.  She has mild, intermittent borderline hypertension. Given her problem with adherence with other medications I will not start any medication today. I talked to her about her morbid obesity and the importance of lifestyle modification to control her weight.  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 She was treated for late latent syphilis and I will need to repeat  her RPR today. I talked to her about the importance of partner selection and regular use of condoms to protect her from more resistant strains of HIV and other sexually transmitted diseases.  Plan: 1. Check CD4 count viral load and RPR 2. Observe off of antiretroviral therapy 3. Followup with pharmacy adherence counseling soon 4. Lifestyle modification counseling provided 5. Prevention for positive counseling provided   Cliffton Asters, MD [facility] for Infectious Disease [facility] [phone] pager   [phone] cell 07/27/2012, 9:59 AM

## 2012-07-28 LAB — T.PALLIDUM AB, TOTAL: T pallidum Antibodies (TP-PA): >8 S/CO — ABNORMAL HIGH (ref <0.90)

## 2012-07-28 LAB — HIV-1 RNA QUANT-NO REFLEX-BLD
HIV 1 RNA Quant: 1328 copies/mL — ABNORMAL HIGH (ref <20)
HIV-1 RNA Quant, Log: 3.12 {Log} — ABNORMAL HIGH (ref <1.30)

## 2012-07-28 LAB — T-HELPER CELL (CD4) - (RCID CLINIC ONLY): CD4 T Cell Abs: 780 uL (ref 400–2700)

## 2012-08-07 DIAGNOSIS — F339 Major depressive disorder, recurrent, unspecified: Secondary | ICD-10-CM | POA: Diagnosis not present

## 2012-08-08 ENCOUNTER — Ambulatory Visit: Payer: Medicare Other

## 2012-08-15 ENCOUNTER — Ambulatory Visit: Payer: Medicare Other

## 2012-11-28 ENCOUNTER — Other Ambulatory Visit: Payer: Medicare Other

## 2012-12-11 ENCOUNTER — Ambulatory Visit: Payer: Medicare Other | Admitting: Internal Medicine

## 2012-12-12 ENCOUNTER — Other Ambulatory Visit (HOSPITAL_COMMUNITY)
Admission: RE | Admit: 2012-12-12 | Discharge: 2012-12-12 | Disposition: A | Discharge status: Home / Self Care | Payer: Medicare Other | Source: Ambulatory Visit | Attending: Internal Medicine | Admitting: Internal Medicine

## 2012-12-12 ENCOUNTER — Encounter: Payer: Self-pay | Admitting: Internal Medicine

## 2012-12-12 ENCOUNTER — Ambulatory Visit (INDEPENDENT_AMBULATORY_CARE_PROVIDER_SITE_OTHER): Payer: Medicare Other | Admitting: Internal Medicine

## 2012-12-12 VITALS — BP 132/83 | HR 72 | Temp 97.9°F | Ht 68.0 in | Wt 281.8 lb

## 2012-12-12 DIAGNOSIS — B20 Human immunodeficiency virus [HIV] disease: Secondary | ICD-10-CM | POA: Diagnosis not present

## 2012-12-12 DIAGNOSIS — Z79899 Other long term (current) drug therapy: Secondary | ICD-10-CM | POA: Diagnosis not present

## 2012-12-12 DIAGNOSIS — Z23 Encounter for immunization: Secondary | ICD-10-CM

## 2012-12-12 DIAGNOSIS — Z113 Encounter for screening for infections with a predominantly sexual mode of transmission: Secondary | ICD-10-CM | POA: Insufficient documentation

## 2012-12-12 LAB — COMPREHENSIVE METABOLIC PANEL
Albumin: 3.2 g/dL — ABNORMAL LOW (ref 3.5–5.2)
Alkaline Phosphatase: 90 U/L (ref 39–117)
BUN: 6 mg/dL (ref 6–23)
Calcium: 9.2 mg/dL (ref 8.4–10.5)
Creat: 1.06 mg/dL (ref 0.50–1.35)
Glucose, Bld: 90 mg/dL (ref 70–99)
Potassium: 4.5 mEq/L (ref 3.5–5.3)

## 2012-12-12 LAB — LIPID PANEL
Total CHOL/HDL Ratio: 4.7 Ratio
VLDL: 21 mg/dL (ref 0–40)

## 2012-12-12 LAB — CBC WITH DIFFERENTIAL/PLATELET
Basophils Absolute: 0 10*3/uL (ref 0.0–0.1)
Basophils Relative: 0 % (ref 0–1)
Eosinophils Relative: 6 % — ABNORMAL HIGH (ref 0–5)
HCT: 43.1 % (ref 39.0–52.0)
Hemoglobin: 15 g/dL (ref 13.0–17.0)
MCH: 30.7 pg (ref 26.0–34.0)
MCHC: 34.8 g/dL (ref 30.0–36.0)
MCV: 88.1 fL (ref 78.0–100.0)
Monocytes Absolute: 0.6 10*3/uL (ref 0.1–1.0)
Monocytes Relative: 10 % (ref 3–12)
Neutro Abs: 2.3 10*3/uL (ref 1.7–7.7)
RDW: 14.6 % (ref 11.5–15.5)

## 2012-12-12 NOTE — Addendum Note (Signed)
Addended by: Lurlean Leyden on: 12/12/2012 10:26 AM   Modules accepted: Orders

## 2012-12-12 NOTE — Progress Notes (Signed)
Patient ID: Robert Dawson, male   DOB: 1976/01/21, 37 y.o.   MRN: [id number]          [facility] for Infectious Disease  Patient Active Problem List   Diagnosis Date Noted  . Peripheral neuropathy 08/31/2010  . MOLE 10/14/2009  . SKIN RASH 10/14/2009  . MORBID OBESITY 04/22/2009  . 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 HIV DISEASE 04/26/2006  . 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 DEPRESSION 04/26/2006  . ALLERGIC RHINITIS 04/26/2006  . LYMPHADENOPATHY 04/26/2006    Patient's Medications  New Prescriptions   No medications on file  Previous Medications   ARIPIPRAZOLE (ABILIFY) 10 MG TABLET    Take 10 mg by mouth daily.     SERTRALINE (ZOLOFT) 100 MG TABLET    Take 100 mg by mouth daily.     TRAZODONE (DESYREL) 100 MG TABLET    Take 100 mg by mouth at bedtime.    Modified Medications   No medications on file  Discontinued Medications   No medications on file    Subjective: Robert Dawson is in for her routine visit. She states that she is still not ready to try restarting antiretroviral therapy. She remains depressed, especially about the problem she perceives with his silicone injections he received in her hips many years ago. She says that they make her hips feel very heavy and she is depressed that she does not have the money our resources to have the silicone removed. She is still seeing a Theatre manager. He recently had a fourth medication that she does not recall the name of. She does remain sexually active with her "ex-boyfriend" but says that he does use condoms now. Review of Systems: Pertinent items are noted in HPI.  Past Medical History  Diagnosis Date  . HIV disease   . Immune deficiency disorder     History  Substance Use Topics  . Smoking status: Never Smoker   . Smokeless tobacco: Never Used  . Alcohol Use: No    No family history on file.  No Known Allergies  Objective: Temp: 97.9 F (36.6 C) (09/16  0935) Temp src: Oral (09/16 0935) BP: 132/83 mmHg (09/16 0935) Pulse Rate: 72 (09/16 0935)  General: He is in no distress but is somewhat flat affect Oral: No oropharyngeal lesions Skin: Multiple tattoos but no rash Lungs: Clear Cor: Regular S1 and S2 no murmurs  Lab Results HIV 1 RNA Quant (copies/mL)  Date Value  07/27/2012 1328*  05/15/2012 63*  12/29/2011 2907*     CD4 T Cell Abs (cmm)  Date Value  07/27/2012 780   05/15/2012 610   12/29/2011 790      Assessment: She is still not ready to retry antiretroviral therapy. I talked to her again about the rationale for treating. I suggested that she set her cell phone alarm to remind her to take her current psychiatric meds and see if she is still missing doses between now and her visit in 3 months.  Plan: 1. Followup after lab work in 3 months 2. Influenza vaccination 3. Condoms provided   Cliffton Asters, MD [facility] for Infectious Disease [facility] [phone] pager   [phone] cell 12/12/2012, 9:55 AM

## 2012-12-13 LAB — HIV-1 RNA QUANT-NO REFLEX-BLD: HIV-1 RNA Quant, Log: 3.29 {Log} — ABNORMAL HIGH (ref <1.30)

## 2012-12-13 LAB — T-HELPER CELL (CD4) - (RCID CLINIC ONLY)
CD4 % Helper T Cell: 17 % — ABNORMAL LOW (ref 33–55)
CD4 T Cell Abs: 500 /uL (ref 400–2700)

## 2013-03-12 ENCOUNTER — Other Ambulatory Visit: Payer: Medicare Other

## 2013-03-27 ENCOUNTER — Ambulatory Visit: Payer: Medicare Other | Admitting: Internal Medicine

## 2013-04-02 ENCOUNTER — Ambulatory Visit (INDEPENDENT_AMBULATORY_CARE_PROVIDER_SITE_OTHER): Payer: Medicare Other | Admitting: Internal Medicine

## 2013-04-02 ENCOUNTER — Encounter: Payer: Self-pay | Admitting: Internal Medicine

## 2013-04-02 VITALS — BP 141/91 | HR 89 | Temp 97.7°F | Ht 69.0 in | Wt 287.0 lb

## 2013-04-02 DIAGNOSIS — M79609 Pain in unspecified limb: Secondary | ICD-10-CM

## 2013-04-02 DIAGNOSIS — M79606 Pain in leg, unspecified: Secondary | ICD-10-CM | POA: Insufficient documentation

## 2013-04-02 DIAGNOSIS — Z113 Encounter for screening for infections with a predominantly sexual mode of transmission: Secondary | ICD-10-CM | POA: Diagnosis not present

## 2013-04-02 DIAGNOSIS — Z79899 Other long term (current) drug therapy: Secondary | ICD-10-CM | POA: Diagnosis not present

## 2013-04-02 DIAGNOSIS — B20 Human immunodeficiency virus [HIV] disease: Secondary | ICD-10-CM

## 2013-04-02 LAB — CBC
HEMATOCRIT: 43.2 % (ref 39.0–52.0)
HEMOGLOBIN: 14.7 g/dL (ref 13.0–17.0)
MCH: 30.4 pg (ref 26.0–34.0)
MCHC: 34 g/dL (ref 30.0–36.0)
MCV: 89.3 fL (ref 78.0–100.0)
Platelets: 265 10*3/uL (ref 150–400)
RBC: 4.84 MIL/uL (ref 4.22–5.81)
RDW: 14.5 % (ref 11.5–15.5)
WBC: 6 10*3/uL (ref 4.0–10.5)

## 2013-04-02 LAB — COMPREHENSIVE METABOLIC PANEL
ALBUMIN: 3.1 g/dL — AB (ref 3.5–5.2)
ALK PHOS: 101 U/L (ref 39–117)
ALT: 15 U/L (ref 0–53)
AST: 41 U/L — ABNORMAL HIGH (ref 0–37)
BUN: 12 mg/dL (ref 6–23)
CALCIUM: 8.8 mg/dL (ref 8.4–10.5)
CHLORIDE: 101 meq/L (ref 96–112)
CO2: 29 meq/L (ref 19–32)
Creat: 1 mg/dL (ref 0.50–1.35)
GLUCOSE: 87 mg/dL (ref 70–99)
POTASSIUM: 4.5 meq/L (ref 3.5–5.3)
SODIUM: 135 meq/L (ref 135–145)
TOTAL PROTEIN: 9.1 g/dL — AB (ref 6.0–8.3)
Total Bilirubin: 0.4 mg/dL (ref 0.3–1.2)

## 2013-04-02 LAB — RPR: RPR: REACTIVE — AB

## 2013-04-02 LAB — RPR TITER: RPR Titer: 1:8 {titer}

## 2013-04-02 NOTE — Progress Notes (Signed)
Patient ID: Robert [facility], male   DOB: 1975-11-13, 38 y.o.   MRN: [id number]          [facility] for Infectious Disease  Patient Active Problem List   Diagnosis Date Noted  . HIV DISEASE 04/26/2006    Priority: High  . Leg pain 04/02/2013    Priority: Medium  . DEPRESSION 04/26/2006    Priority: Medium  . Peripheral neuropathy 08/31/2010  . MOLE 10/14/2009  . SKIN RASH 10/14/2009  . MORBID OBESITY 04/22/2009  . 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 ALLERGIC RHINITIS 04/26/2006  . LYMPHADENOPATHY 04/26/2006    Patient's Medications  New Prescriptions   No medications on file  Previous Medications   ARIPIPRAZOLE (ABILIFY) 10 MG TABLET    Take 10 mg by mouth daily.     TRAZODONE (DESYREL) 100 MG TABLET    Take 100 mg by mouth at bedtime.    Modified Medications   No medications on file  Discontinued Medications   SERTRALINE (ZOLOFT) 100 MG TABLET    Take 100 mg by mouth daily.      Subjective: [facility] is in for routine visit. She states that she has been feeling more depressed recently. She missed her last appointment at [facility] and ran out of her medications. She had been taking Abilify and trazodone. She was also on one other medication for depression but does not recall the name. She's been out of her medicines for 2 weeks and can feel a definite change in her mood. She feels more depressed with more mood swings. She says that she has difficulty remembering simple things that she has done or been told. Before running out of her medicine she was missing doses of her medications. She did not set herself on alarm to remind her to take her medications as we discussed at the time of her last visit. She is not using a pill box.  She now tells me that she is never told in her mother about her HIV infection. She also has a roommate who was unaware. She does not feel if she has any wanted she could tell  that would help her remember to take her medications. She gets her [facility] at [facility] on [address]. She states that if her medicines could be delivered to her house she think she would do better taking them.  2 still complaining of leg pain bilaterally. She feels fullness in her thighs and aching pain in her knees. She is worried that this is due to her remote history of "black market" silicone injections in her buttocks. Review of Systems: Constitutional: negative for anorexia, chills, fevers, malaise, sweats and weight loss Eyes: negative Ears, nose, mouth, throat, and face: negative Respiratory: negative Cardiovascular: negative Gastrointestinal: negative Genitourinary:negative Behavioral/Psych: positive for depression and mood swings, negative for excessive alcohol consumption and illegal drug usage  Past Medical History  Diagnosis Date  . HIV disease   . Immune deficiency disorder     History  Substance Use Topics  . Smoking status: Never Smoker   . Smokeless tobacco: Never Used  . Alcohol Use: No    No family history on file.  No Known Allergies  Objective: Temp: 97.7 F (36.5 C) (01/05 1441) Temp src: Oral (01/05 1441) BP: 141/91 mmHg (01/05 1441) Pulse Rate: 89 (01/05 1441)  General: She appears slightly dejected Oral: No oropharyngeal lesions Skin: No rash Lungs: Clear Cor: Regular S1  and S2 no murmurs Abdomen: Obese, soft nontender Joints and extremities: Shows no acute abnormalities of her joints. She has some mild discomfort with palpation of her buttocks and thighs but no other signs of acute inflammation Neuro: Alert and fully oriented with normal speech and conversation Mood and affect: Slightly dejected as noted above is somewhat distractible  Lab Results Lab Results  Component Value Date   WBC 6.1 12/12/2012   HGB 15.0 12/12/2012   HCT 43.1 12/12/2012   MCV 88.1 12/12/2012   PLT 265 12/12/2012    Lab Results  Component Value  Date   CREATININE 1.06 12/12/2012   BUN 6 12/12/2012   NA 134* 12/12/2012   K 4.5 12/12/2012   CL 101 12/12/2012   CO2 29 12/12/2012    Lab Results  Component Value Date   ALT 21 12/12/2012   AST 45* 12/12/2012   ALKPHOS 90 12/12/2012   BILITOT 0.4 12/12/2012    Lab Results  Component Value Date   CHOL 108 12/12/2012   HDL 23* 12/12/2012   LDLCALC 64 12/12/2012   TRIG 107 12/12/2012   CHOLHDL 4.7 12/12/2012    Lab Results HIV 1 RNA Quant (copies/mL)  Date Value  12/12/2012 1933*  07/27/2012 1328*  05/15/2012 63*     CD4 T Cell Abs (/uL)  Date Value  12/12/2012 500   07/27/2012 780   05/15/2012 610      Assessment: Her depression is worse since stopping her psychotropic medications. I've encouraged her to make a walk-in visit to [facility] as soon as possible. We have given her information about [facility] who will blister pack her medications and send them to her home. It will be crucial to get her depression under better control before considering antiretroviral therapy. Once she is more adherent and consistent with her psychotropic medications I would consider restarting her on a once daily one pill regimen with close followup. I'm not sure what is causing her chronic leg pain. I do not see any clear evidence of avascular necrosis of her hips or degenerative arthritis of her knees. I do not see any focal skin breakdown or other lesions to indicate definite adverse reactions to previous silicone injections.  Plan: 1. Return to [place] as soon as possible 2. Change pharmacies and start home delivery of medications 3. Followup in 2 weeks   Robert Bickers, MD [facility] [place] [phone] pager   [phone] cell 04/02/2013, 4:45 PM

## 2013-04-03 LAB — T.PALLIDUM AB, TOTAL

## 2013-04-05 LAB — T-HELPER CELL (CD4) - (RCID CLINIC ONLY)
CD4 T CELL ABS: 580 /uL (ref 400–2700)
CD4 T CELL HELPER: 20 % — AB (ref 33–55)

## 2013-04-05 LAB — HIV-1 RNA QUANT-NO REFLEX-BLD
HIV 1 RNA QUANT: 4953 {copies}/mL — AB (ref <20)
HIV-1 RNA QUANT, LOG: 3.69 {Log} — AB (ref <1.30)

## 2013-04-18 ENCOUNTER — Telehealth: Payer: Self-pay | Admitting: *Deleted

## 2013-04-18 ENCOUNTER — Ambulatory Visit: Payer: Medicare Other | Admitting: Internal Medicine

## 2013-04-18 NOTE — Telephone Encounter (Signed)
Requested pt call [facility] for new appt w/ [name].

## 2013-06-13 ENCOUNTER — Telehealth: Payer: Self-pay | Admitting: *Deleted

## 2013-06-13 NOTE — Telephone Encounter (Signed)
Needing f/u MD appt.  Pt currently staying with her mother in [place], [place].  Coming back to [place] soon.   RN let the pt know that we have appt for [name] next week if she is coming back that soon.  Pt asked whether she would be able to keep [name] as her MD is she permanently moved to [facility].  RN shared that that would not be a problem if she is able to keep her appointments.  Pt shared that she would be considering this option.

## 2013-06-26 ENCOUNTER — Telehealth: Payer: Self-pay | Admitting: *Deleted

## 2013-06-26 ENCOUNTER — Ambulatory Visit: Payer: Medicare Other | Admitting: Internal Medicine

## 2013-06-26 NOTE — Telephone Encounter (Signed)
[place]  [place] to [place].  Pt will call for appt when she gets back to [place].

## 2013-07-04 ENCOUNTER — Other Ambulatory Visit: Payer: Medicare Other

## 2013-07-16 ENCOUNTER — Ambulatory Visit: Payer: Medicare Other | Admitting: Internal Medicine

## 2013-08-02 ENCOUNTER — Telehealth: Payer: Self-pay | Admitting: *Deleted

## 2013-08-02 NOTE — Telephone Encounter (Signed)
[name] is in clinic next Tues and Thurs from 9-11.  Pt can "walk-in" to be seen during those times.  Pt stated that she will be in Tues. Morning.

## 2013-08-07 ENCOUNTER — Other Ambulatory Visit: Payer: Medicare Other

## 2013-08-07 ENCOUNTER — Ambulatory Visit (INDEPENDENT_AMBULATORY_CARE_PROVIDER_SITE_OTHER): Payer: Medicare Other | Admitting: Internal Medicine

## 2013-08-07 ENCOUNTER — Encounter: Payer: Self-pay | Admitting: Internal Medicine

## 2013-08-07 VITALS — BP 141/93 | HR 98 | Temp 98.0°F | Wt 282.0 lb

## 2013-08-07 DIAGNOSIS — R062 Wheezing: Secondary | ICD-10-CM | POA: Diagnosis not present

## 2013-08-07 MED ORDER — ALBUTEROL SULFATE HFA 108 (90 BASE) MCG/ACT IN AERS: 2.0000 | INHALATION_SPRAY | Freq: Four times a day (QID) | RESPIRATORY_TRACT | Status: DC | PRN

## 2013-08-07 NOTE — Progress Notes (Signed)
  [facility] - Pharmacist    HPI: Robert Dawson is a 38 y.o. male here for work in visit due to a persistent cough.  She has previously been diagnosed with HIV, but has not started treatment.    Allergies: No Known Allergies  Vitals: Temp: 98 F (36.7 C) (05/12 0904) BP: 141/93 mmHg (05/12 0911) Pulse Rate: 98 (05/12 0911)  Past Medical History: Past Medical History  Diagnosis Date  . HIV disease   . Immune deficiency disorder     Social History: History   Social History  . Marital Status: Single    Spouse Name: N/A    Number of Children: N/A  . Years of Education: N/A   Social History Main Topics  . Smoking status: Never Smoker   . Smokeless tobacco: Never Used  . Alcohol Use: No  . Drug Use: No  . Sexual Activity: Yes     Comment: given condoms   Other Topics Concern  . None   Social History Narrative  . None    Current Regimen: Patient has not yet started treatment.  Will discuss regimen at future visit.   Labs: HIV 1 RNA Quant (copies/mL)  Date Value  04/02/2013 4953*  12/12/2012 1933*  07/27/2012 1328*     CD4 T Cell Abs (/uL)  Date Value  04/02/2013 580   12/12/2012 500   07/27/2012 780      Hep B S Ab (no units)  Date Value  11/07/2007 POS*     Hepatitis B Surface Ag (no units)  Date Value  05/23/2006 NO      HCV Ab (no units)  Date Value  05/23/2006 NO     CrCl: The CrCl is unknown because both a height and weight (above a minimum accepted value) are required for this calculation.  Lipids:    Component Value Date/Time   CHOL 108 12/12/2012 1000   TRIG 107 12/12/2012 1000   HDL 23* 12/12/2012 1000   CHOLHDL 4.7 12/12/2012 1000   VLDL 21 12/12/2012 1000   LDLCALC 64 12/12/2012 1000    Assessment: Patient states she has not started treatment because she is concerned about getting the medications each month.  She is requesting the medications be delivered to the clinic since she is in the process of moving  to [facility].  She states she would like to continue care with [name] and is willing to come to clinic every 3 months to pick up her medications.    Recommendations: 1. Follow-up start of ART in 2 weeks 2. We can have 3 month supply sent to clinic from [facility] for the patient to pick up  Erik Obey, Pharm.D. Clinical Pharmacist - Resident [facility] 08/07/2013, 5:10 PM

## 2013-08-07 NOTE — Progress Notes (Signed)
Patient ID: Steffon Gladu, male   DOB: 1976-03-07, 38 y.o.   MRN: [id number]          Patient Active Problem List   Diagnosis Date Noted  . HIV DISEASE 04/26/2006    Priority: High  . Leg pain 04/02/2013    Priority: Medium  . DEPRESSION 04/26/2006    Priority: Medium  . Wheezing 08/07/2013  . Peripheral neuropathy 08/31/2010  . MOLE 10/14/2009  . SKIN RASH 10/14/2009  . MORBID OBESITY 04/22/2009  . 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 ALLERGIC RHINITIS 04/26/2006  . LYMPHADENOPATHY 04/26/2006    Patient's Medications  New Prescriptions   ALBUTEROL (PROVENTIL HFA;VENTOLIN HFA) 108 (90 BASE) MCG/ACT INHALER    Inhale 2 puffs into the lungs every 6 (six) hours as needed for wheezing or shortness of breath.  Previous Medications   ARIPIPRAZOLE (ABILIFY) 10 MG TABLET    Take 10 mg by mouth daily.     TRAZODONE (DESYREL) 100 MG TABLET    Take 100 mg by mouth at bedtime.    Modified Medications   No medications on file  Discontinued Medications   No medications on file    Subjective: Domnick is in for a work in visit. Over the past few months she has had a persistent dry cough with some rattling and wheezing in her chest. She has not had any fever. Review of Systems: Pertinent items are noted in HPI.  Past Medical History  Diagnosis Date  . HIV disease   . Immune deficiency disorder     History  Substance Use Topics  . Smoking status: Never Smoker   . Smokeless tobacco: Never Used  . Alcohol Use: No    No family history on file.  No Known Allergies  Objective: Temp: 98 F (36.7 C) (05/12 0904) BP: 141/93 mmHg (05/12 0911) Pulse Rate: 98 (05/12 0911) Body mass index is 41.63 kg/(m^2).  General: She is alert and in no distress Lungs: Faint scattered wheezes Cor: Regular S1 and S2 no murmurs  Lab Results Lab Results  Component Value Date   WBC 6.0 04/02/2013   HGB 14.7 04/02/2013     HCT 43.2 04/02/2013   MCV 89.3 04/02/2013   PLT 265 04/02/2013    Lab Results  Component Value Date   CREATININE 1.00 04/02/2013   BUN 12 04/02/2013   NA 135 04/02/2013   K 4.5 04/02/2013   CL 101 04/02/2013   CO2 29 04/02/2013    Lab Results  Component Value Date   ALT 15 04/02/2013   AST 41* 04/02/2013   ALKPHOS 101 04/02/2013   BILITOT 0.4 04/02/2013    Lab Results  Component Value Date   CHOL 108 12/12/2012   HDL 23* 12/12/2012   LDLCALC 64 12/12/2012   TRIG 107 12/12/2012   CHOLHDL 4.7 12/12/2012    Lab Results HIV 1 RNA Quant (copies/mL)  Date Value  04/02/2013 4953*  12/12/2012 1933*  07/27/2012 1328*     CD4 T Cell Abs (/uL)  Date Value  04/02/2013 580   12/12/2012 500   07/27/2012 780      Assessment: She does develop mild asthmatic bronchitis. She has been considering starting antiretroviral therapy.  Plan: 1. Trial of albuterol inhaler 2. Followup in 2 weeks to consider starting antiretroviral therapy.   Michel Bickers, MD [facility] [place] [phone] pager   [phone] cell 08/07/2013, 9:21 AM

## 2013-09-11 ENCOUNTER — Ambulatory Visit (INDEPENDENT_AMBULATORY_CARE_PROVIDER_SITE_OTHER): Payer: Medicare Other | Admitting: Internal Medicine

## 2013-09-11 ENCOUNTER — Encounter: Payer: Self-pay | Admitting: Internal Medicine

## 2013-09-11 VITALS — BP 157/108 | HR 83 | Temp 98.6°F | Wt 287.0 lb

## 2013-09-11 DIAGNOSIS — B20 Human immunodeficiency virus [HIV] disease: Secondary | ICD-10-CM | POA: Diagnosis not present

## 2013-09-11 MED ORDER — ELVITEG-COBIC-EMTRICIT-TENOFDF 150-150-200-300 MG PO TABS: 1.0000 | ORAL_TABLET | Freq: Every day | ORAL | Status: DC

## 2013-09-11 NOTE — Progress Notes (Signed)
Patient ID: Robert Dawson, male   DOB: 10/22/1975, 38 y.o.   MRN: [id number]          Patient Active Problem List   Diagnosis Date Noted  . HIV DISEASE 04/26/2006    Priority: High  . Leg pain 04/02/2013    Priority: Medium  . DEPRESSION 04/26/2006    Priority: Medium  . Wheezing 08/07/2013  . Peripheral neuropathy 08/31/2010  . MOLE 10/14/2009  . SKIN RASH 10/14/2009  . MORBID OBESITY 04/22/2009  . 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 ALLERGIC RHINITIS 04/26/2006  . LYMPHADENOPATHY 04/26/2006    Patient's Medications  New Prescriptions   ELVITEGRAVIR-COBICISTAT-EMTRICITABINE-TENOFOVIR (STRIBILD) 150-150-200-300 MG TABS TABLET    Take 1 tablet by mouth daily with breakfast.  Previous Medications   ALBUTEROL (PROVENTIL HFA;VENTOLIN HFA) 108 (90 BASE) MCG/ACT INHALER    Inhale 2 puffs into the lungs every 6 (six) hours as needed for wheezing or shortness of breath.   ARIPIPRAZOLE (ABILIFY) 10 MG TABLET    Take 10 mg by mouth daily.     TRAZODONE (DESYREL) 100 MG TABLET    Take 100 mg by mouth at bedtime.    Modified Medications   No medications on file  Discontinued Medications   No medications on file    Subjective: Robert Dawson is seeing a work in basis. She continues to be bothered by dry cough and wheezing. She has been using her albuterol inhaler 4 times daily. She describes using it correctly. She notes that her wheezing and cough began shortly after she moved into the house she is currently living in. Neighbors have told her that the house has problems with water in the basement and mold. She is interested in retrying antiretroviral therapy. She took Atripla for about one month in late 2013 but never had it refilled. She has been off of her psych medications.  Review of Systems: Pertinent items are noted in HPI.  Past Medical History  Diagnosis Date  . HIV disease   . Immune deficiency disorder      History  Substance Use Topics  . Smoking status: Never Smoker   . Smokeless tobacco: Never Used  . Alcohol Use: No    No family history on file.  No Known Allergies  Objective: Temp: 98.6 F (37 C) (06/16 0910) Temp src: Oral (06/16 0910) BP: 157/108 mmHg (06/16 0910) Pulse Rate: 83 (06/16 0910) Body mass index is 42.36 kg/(m^2).  General: She is in no distress Oral: No oropharyngeal lesions Skin: No rash Lungs: Scattered wheezing. No increased work of breathing  Cor: Regular S1 and S2 with no murmurs Mood and affect: Normal  Lab Results Lab Results  Component Value Date   WBC 6.0 04/02/2013   HGB 14.7 04/02/2013   HCT 43.2 04/02/2013   MCV 89.3 04/02/2013   PLT 265 04/02/2013    Lab Results  Component Value Date   CREATININE 1.00 04/02/2013   BUN 12 04/02/2013   NA 135 04/02/2013   K 4.5 04/02/2013   CL 101 04/02/2013   CO2 29 04/02/2013    Lab Results  Component Value Date   ALT 15 04/02/2013   AST 41* 04/02/2013   ALKPHOS 101 04/02/2013   BILITOT 0.4 04/02/2013    Lab Results  Component Value Date   CHOL 108 12/12/2012   HDL 23* 12/12/2012   LDLCALC 64 12/12/2012   TRIG 107 12/12/2012   CHOLHDL 4.7 12/12/2012  Lab Results HIV 1 RNA Quant (copies/mL)  Date Value  04/02/2013 4953*  12/12/2012 1933*  07/27/2012 1328*     CD4 T Cell Abs (/uL)  Date Value  04/02/2013 580   12/12/2012 500   07/27/2012 780      Assessment: She is now ready to restart antiretroviral therapy. I will start her on Stribild.  She continues to have problems with cough and wheezing. There may be some environmental trigger in her current home. We will have her establish primary care.  Plan: 1. Start Stribild 2. Pharmacy counseling about how to use her albuterol inhaler 3. Establish primary care 4. Followup in 4 weeks   Robert Bickers, MD [facility] for [place] [phone] pager   [phone] cell 09/11/2013, 9:32 AM

## 2013-09-11 NOTE — Progress Notes (Signed)
Patient ID: Robert Dawson, male   DOB: [birth year], 38 y.o.   MRN: [id number] HPI: Robert Dawson is a 38 y.o. male who is here for her f/u.  Allergies: No Known Allergies  Vitals: Temp: 98.6 F (37 C) (06/16 0910) Temp src: Oral (06/16 0910) BP: 157/108 mmHg (06/16 0910) Pulse Rate: 83 (06/16 0910)  Past Medical History: Past Medical History  Diagnosis Date  . HIV disease   . Immune deficiency disorder     Social History: History   Social History  . Marital Status: Single    Spouse Name: N/A    Number of Children: N/A  . Years of Education: N/A   Social History Main Topics  . Smoking status: Never Smoker   . Smokeless tobacco: Never Used  . Alcohol Use: No  . Drug Use: No  . Sexual Activity: Yes     Comment: given condoms   Other Topics Concern  . None   Social History Narrative  . None    Previous Regimen: Atripla  Current Regimen: None  Labs: HIV 1 RNA Quant (copies/mL)  Date Value  04/02/2013 4953*  12/12/2012 1933*  07/27/2012 1328*     CD4 T Cell Abs (/uL)  Date Value  04/02/2013 580   12/12/2012 500   07/27/2012 780      Hep B S Ab (no units)  Date Value  11/07/2007 POS*     Hepatitis B Surface Ag (no units)  Date Value  05/23/2006 NO      HCV Ab (no units)  Date Value  05/23/2006 NO     CrCl: The CrCl is unknown because both a height and weight (above a minimum accepted value) are required for this calculation.  Lipids:    Component Value Date/Time   CHOL 108 12/12/2012 1000   TRIG 107 12/12/2012 1000   HDL 23* 12/12/2012 1000   CHOLHDL 4.7 12/12/2012 1000   VLDL 21 12/12/2012 1000   LDLCALC 64 12/12/2012 1000    Assessment: 38 yo who is here as a walk in pt. She has had some asthma related issue that is currently on albuterol. She is currently not on ART and we have discussed Stribild in the past. We are going to start today and she agreed to do so. She wants the meds to come to the clinic instead due to unstable housing  situation. I called [facility] to let them know this and they said it was ok.   Recommendations:  Stribild 1 PO qday Meds send to clinic for pick up  Wilfred Lacy, PharmD [place] [facility] 09/11/2013, 9:41 AM

## 2013-09-20 ENCOUNTER — Other Ambulatory Visit: Payer: Self-pay | Admitting: *Deleted

## 2013-09-20 ENCOUNTER — Encounter: Payer: Self-pay | Admitting: Internal Medicine

## 2013-09-20 ENCOUNTER — Ambulatory Visit (INDEPENDENT_AMBULATORY_CARE_PROVIDER_SITE_OTHER): Payer: Medicare Other | Admitting: Internal Medicine

## 2013-09-20 VITALS — BP 131/93 | HR 89 | Temp 98.0°F | Resp 20 | Ht 69.0 in | Wt 284.0 lb

## 2013-09-20 DIAGNOSIS — J3089 Other allergic rhinitis: Secondary | ICD-10-CM

## 2013-09-20 DIAGNOSIS — B20 Human immunodeficiency virus [HIV] disease: Secondary | ICD-10-CM

## 2013-09-20 DIAGNOSIS — R7989 Other specified abnormal findings of blood chemistry: Secondary | ICD-10-CM

## 2013-09-20 DIAGNOSIS — I10 Essential (primary) hypertension: Secondary | ICD-10-CM

## 2013-09-20 DIAGNOSIS — F640 Transsexualism: Secondary | ICD-10-CM

## 2013-09-20 DIAGNOSIS — F329 Major depressive disorder, single episode, unspecified: Secondary | ICD-10-CM

## 2013-09-20 DIAGNOSIS — F32A Depression, unspecified: Secondary | ICD-10-CM

## 2013-09-20 DIAGNOSIS — Z0000 Encounter for general adult medical examination without abnormal findings: Secondary | ICD-10-CM | POA: Diagnosis not present

## 2013-09-20 DIAGNOSIS — R1907 Generalized intra-abdominal and pelvic swelling, mass and lump: Secondary | ICD-10-CM

## 2013-09-20 DIAGNOSIS — F3289 Other specified depressive episodes: Secondary | ICD-10-CM | POA: Diagnosis not present

## 2013-09-20 DIAGNOSIS — R062 Wheezing: Secondary | ICD-10-CM | POA: Diagnosis not present

## 2013-09-20 DIAGNOSIS — R946 Abnormal results of thyroid function studies: Secondary | ICD-10-CM

## 2013-09-20 DIAGNOSIS — Z789 Other specified health status: Secondary | ICD-10-CM

## 2013-09-20 MED ORDER — ELVITEG-COBIC-EMTRICIT-TENOFDF 150-150-200-300 MG PO TABS: 1.0000 | ORAL_TABLET | Freq: Every day | ORAL | Status: DC

## 2013-09-20 MED ORDER — LORATADINE 10 MG PO TABS: 10.0000 mg | ORAL_TABLET | Freq: Every day | ORAL | Status: DC

## 2013-09-20 NOTE — Progress Notes (Signed)
Patient ID: Robert Dawson, male   DOB: [birth year], 38 y.o.   MRN: [id number]   Robert Dawson, is a 38 y.o. male  VPX:[id number]  IOE:[id number]  DOB - 1976-03-29  CC:  Chief Complaint  Patient presents with  . Establish Care    NP/Cough x's 3 months also  Pt states cough up blood at times. Dry Cough Only productive in early moring       HPI: Robert Dawson is a 38 y.o. male here today to establish medical care. 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 She has had silicone injections into her breasts and hips. They were done on the "black market" and not under the care of a Physician. She states that her hips are now to the point where she has pain with standing.  Pt also c/o dry cough which has been present for several months. She also complains of intermittent wheezing and was prescribed albuterol inhaler which she does not feel is effective in treating the wheezing. Pt has not taken any specific anti-histamines but has taken multi-symptom OTC remedies. She denies any pedal edema, orthopnea or PND. She also denies any CP, but does feel that she sometimes has DOE when the weather is excessively hot.  Pt had been under the care of [facility] for a reported diagnosis of Depression (per patient) and was placed on Abilify and Trazadone. Pt states that she has never been diagnosed with either bi-polar disorder or schizophrenia disorder.   Patient has No headache, No chest pain, No abdominal pain - No Nausea, No new weakness tingling or numbness, No Cough - SOB.  No Known Allergies Past Medical History  Diagnosis Date  . HIV disease   . Immune deficiency disorder   . Depression   . Allergy    Current Outpatient Prescriptions on File Prior to Visit  Medication Sig Dispense Refill  . albuterol (PROVENTIL HFA;VENTOLIN HFA) 108 (90 BASE) MCG/ACT inhaler Inhale 2 puffs into the lungs every 6 (six) hours as needed for  wheezing or shortness of breath.  1 Inhaler  6  . ARIPiprazole (ABILIFY) 10 MG tablet Take 10 mg by mouth daily.        Marland Kitchen elvitegravir-cobicistat-emtricitabine-tenofovir (STRIBILD) 150-150-200-300 MG TABS tablet Take 1 tablet by mouth daily with breakfast.  30 tablet  11  . traZODone (DESYREL) 100 MG tablet Take 100 mg by mouth at bedtime.         No current facility-administered medications on file prior to visit.   Family History  Problem Relation Age of Onset  . Diabetes Maternal Grandmother   . Hypertension Maternal Grandmother    History   Social History  . Marital Status: Single    Spouse Name: N/A    Number of Children: N/A  . Years of Education: N/A   Occupational History  . Not on file.   Social History Main Topics  . Smoking status: Never Smoker   . Smokeless tobacco: Never Used  . Alcohol Use: No  . Drug Use: No  . Sexual Activity: Not Currently     Comment: given condoms. Pt is an under.   Other Topics Concern  . Not on file   Social History Narrative  . No narrative on file    Review of Systems: Constitutional: Negative for fever, chills, diaphoresis, activity change, appetite change and fatigue. HENT: Negative for ear pain, nosebleeds, congestion, facial swelling, rhinorrhea, neck pain, neck stiffness and ear  discharge.  Eyes: Negative for pain, discharge, redness, itching and visual disturbance. Respiratory: Negative for cough, choking, chest tightness, shortness of breath, wheezing and stridor.  Cardiovascular: Negative for chest pain, palpitations and leg swelling. Gastrointestinal: Negative for abdominal distention. Genitourinary: Negative for dysuria, urgency, frequency, hematuria, flank pain, decreased urine volume, difficulty urinating and dyspareunia.  Musculoskeletal: Negative for back pain, joint swelling. Pt does c/o hip pain with prolonged standing. Neurological: Negative for dizziness, tremors, seizures, syncope, facial asymmetry, speech  difficulty, weakness, light-headedness, numbness and headaches.  Hematological: Negative for adenopathy. Does not bruise/bleed easily. Psychiatric/Behavioral: Negative for hallucinations, behavioral problems, confusion, dysphoric mood, decreased concentration and agitation.    Objective:   Filed Vitals:   09/20/13 0836  BP: 131/93  Pulse: 89  Temp: 98 F (36.7 C)  Resp: 20    Physical Exam: Constitutional: Patient appears well-developed and well-nourished. No distress. She is feminine in appearance. HENT: Normocephalic, atraumatic, External right and left ear normal. Oropharynx is clear and moist. Pt has tongue piercings. Eyes: Conjunctivae and EOM are normal. PERRLA, no scleral icterus. Neck: Normal ROM. Neck supple. No JVD. No tracheal deviation. No thyromegaly. CVS: RRR, S1/S2 +, no murmurs, no gallops, no carotid bruit.  Pulmonary: Effort and breath sounds normal, no stridor, rhonchi, rales. She does have occasional wheezing. Abdominal: Soft. BS +, no distension, tenderness, rebound or guarding.  Musculoskeletal: Normal range of motion. No edema and no tenderness. Unable to palpate hips secondary to soft tissue approximation.  Lymphadenopathy: No lymphadenopathy noted, cervical, inguinal or axillary Neuro: Alert. Normal reflexes, muscle tone coordination. No cranial nerve deficit. Skin: Skin is warm and dry. No rash noted. Not diaphoretic. No erythema. No pallor. There are masses palpated superficially under the skin of the posterior hips. Psychiatric: Normal mood and affect. Behavior, judgment, thought content normal.  Lab Results  Component Value Date   WBC 6.0 04/02/2013   HGB 14.7 04/02/2013   HCT 43.2 04/02/2013   MCV 89.3 04/02/2013   PLT 265 04/02/2013   Lab Results  Component Value Date   CREATININE 1.00 04/02/2013   BUN 12 04/02/2013   NA 135 04/02/2013   K 4.5 04/02/2013   CL 101 04/02/2013   CO2 29 04/02/2013    No results found for this basename: HGBA1C   Lipid Panel      Component Value Date/Time   CHOL 108 12/12/2012 1000   TRIG 107 12/12/2012 1000   HDL 23* 12/12/2012 1000   CHOLHDL 4.7 12/12/2012 1000   VLDL 21 12/12/2012 1000   LDLCALC 64 12/12/2012 1000       Assessment and plan:    1. Wheezing - Likely related to chronic bronchitis vs. Asthma. No evidence of heart disease. 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 She does not plan to have the gender re-assignment surgery.  3. Depression - Pt has been under the care of [facility] for depression. I have advised that she need to resume her care with them as she has been placed on medications some of which are outside the scope of Primary Care. I will not restart medications at this time. Pt will make an appointment for [facility]. - TSH  4. Preventative health care - Will obtain 12-lead EKG  - EKG 12-Lead for baseline.  5. Essential hypertension - Pt has stage 1 HTN. I have discussed dietary changes and patietn will implement those changes. Will recheck BP at next visit  - EKG 12-Lead  6. Other allergic rhinitis - Pt has had a persistent non-productive cough and intermittent wheezing associate with rhinorrhea and sneezing.  - loratadine (CLARITIN) 10 MG tablet; Take 1 tablet (10 mg total) by mouth daily.  Dispense: 30 tablet; Refill: 11  7. Generalized Swelling, mass, or lump of abdomen or pelvis - Pt has history of silicone injections and now has sub-cutaneous masses that she feels is associated with injections. I have spoken with a Plastic Surgeon in the area who recommends that patient be referred to [facility] for any Silicone removal work. Will order CT pelvis to evaluate masses. - CT pelvis  Return in about 1 month (around 10/20/2013) for Review of lab data, wheezing.  The patient was given clear instructions to go to ER or return to medical center if symptoms don't improve, worsen or new problems develop. The patient  verbalized understanding. The patient was told to call to get lab results if they haven't heard anything in the next week.     This note has been created with Surveyor, quantity. Any transcriptional errors are unintentional.    MATTHEWS,MICHELLE A., MD [place], [place]   09/20/2013, 9:45 AM

## 2013-09-20 NOTE — Telephone Encounter (Signed)
Requested [facility] mail Stribild rx to [place] office for pt pickup.

## 2013-09-21 DIAGNOSIS — F32A Depression, unspecified: Secondary | ICD-10-CM | POA: Insufficient documentation

## 2013-09-21 DIAGNOSIS — F640 Transsexualism: Secondary | ICD-10-CM | POA: Insufficient documentation

## 2013-09-21 DIAGNOSIS — R1907 Generalized intra-abdominal and pelvic swelling, mass and lump: Secondary | ICD-10-CM | POA: Insufficient documentation

## 2013-09-21 DIAGNOSIS — J3089 Other allergic rhinitis: Secondary | ICD-10-CM | POA: Insufficient documentation

## 2013-09-21 DIAGNOSIS — I10 Essential (primary) hypertension: Secondary | ICD-10-CM | POA: Insufficient documentation

## 2013-09-21 DIAGNOSIS — Z789 Other specified health status: Secondary | ICD-10-CM | POA: Insufficient documentation

## 2013-09-21 DIAGNOSIS — F329 Major depressive disorder, single episode, unspecified: Secondary | ICD-10-CM | POA: Insufficient documentation

## 2013-09-21 DIAGNOSIS — Z0000 Encounter for general adult medical examination without abnormal findings: Secondary | ICD-10-CM | POA: Insufficient documentation

## 2013-09-21 LAB — TSH: TSH: 7.268 u[IU]/mL — AB (ref 0.350–4.500)

## 2013-10-02 ENCOUNTER — Telehealth: Payer: Self-pay

## 2013-10-02 DIAGNOSIS — R05 Cough: Secondary | ICD-10-CM

## 2013-10-02 DIAGNOSIS — R042 Hemoptysis: Secondary | ICD-10-CM

## 2013-10-02 DIAGNOSIS — R059 Cough, unspecified: Secondary | ICD-10-CM

## 2013-10-02 NOTE — Telephone Encounter (Signed)
Pt contacted office today stating cough is worse since last visit.Pt was also asking about a poss.Chest X-Ray as well as the PFT that was discussed durring visit, Plz Advise.

## 2013-10-03 ENCOUNTER — Ambulatory Visit (INDEPENDENT_AMBULATORY_CARE_PROVIDER_SITE_OTHER): Payer: Medicare Other | Admitting: Internal Medicine

## 2013-10-03 ENCOUNTER — Encounter: Payer: Self-pay | Admitting: Internal Medicine

## 2013-10-03 ENCOUNTER — Telehealth: Payer: Self-pay

## 2013-10-03 ENCOUNTER — Ambulatory Visit (HOSPITAL_COMMUNITY)
Admission: RE | Admit: 2013-10-03 | Discharge: 2013-10-03 | Disposition: A | Discharge status: Home / Self Care | Payer: Medicare Other | Source: Ambulatory Visit | Attending: Internal Medicine | Admitting: Internal Medicine

## 2013-10-03 VITALS — BP 109/84 | HR 104 | Temp 98.0°F | Resp 20 | Wt 286.0 lb

## 2013-10-03 DIAGNOSIS — J209 Acute bronchitis, unspecified: Secondary | ICD-10-CM | POA: Diagnosis not present

## 2013-10-03 DIAGNOSIS — R059 Cough, unspecified: Secondary | ICD-10-CM

## 2013-10-03 DIAGNOSIS — R042 Hemoptysis: Secondary | ICD-10-CM | POA: Diagnosis not present

## 2013-10-03 DIAGNOSIS — R05 Cough: Secondary | ICD-10-CM

## 2013-10-03 MED ORDER — PREDNISONE (PAK) 10 MG PO TABS: ORAL_TABLET | Freq: Every day | ORAL | Status: DC

## 2013-10-03 MED ORDER — AZITHROMYCIN 250 MG PO TABS: ORAL_TABLET | ORAL | Status: DC

## 2013-10-03 NOTE — Telephone Encounter (Signed)
Pt continues to have a complaint of cough and states that she is now consistently having blood. She also thinks that she has had a fever. I will see patient today for an acute care visit.  Advised that patient should have a CXR before coming to acute appointment today.  Spoke with CMA and directed her to order placed on 09/20/2013 for PFT. [name] will follow up with referral process.

## 2013-10-03 NOTE — Progress Notes (Signed)
Patient ID: Robert Dawson, male   DOB: 01/17/1976, 38 y.o.   MRN: [id number]   Robert Dawson, is a 38 y.o. male  [id number]  [id number]  DOB - 02-14-1976  CC:  Chief Complaint  Patient presents with  . Acute walk in    Persistent cough        HPI: Robert Dawson is a 38 y.o. male here today with c/o cough and hemoptysis. She initially reported on the phone that she was coughing up blood. However she now clarifies that it is mostly clear sputum and specks of blood. She also reports a non-productive cough that has been present for several months and is now accompanied by frequent wheezing and some SOB. She denies fever or chills. Patient has No headache, No chest pain, No abdominal pain - No Nausea, No new weakness tingling or numbness..  No Known Allergies Past Medical History  Diagnosis Date  . HIV disease   . Immune deficiency disorder   . Depression   . Allergy    Current Outpatient Prescriptions on File Prior to Visit  Medication Sig Dispense Refill  . albuterol (PROVENTIL HFA;VENTOLIN HFA) 108 (90 BASE) MCG/ACT inhaler Inhale 2 puffs into the lungs every 6 (six) hours as needed for wheezing or shortness of breath.  1 Inhaler  6  . ARIPiprazole (ABILIFY) 10 MG tablet Take 10 mg by mouth daily.        Marland Kitchen elvitegravir-cobicistat-emtricitabine-tenofovir (STRIBILD) 150-150-200-300 MG TABS tablet Take 1 tablet by mouth daily with breakfast.  30 tablet  11  . loratadine (CLARITIN) 10 MG tablet Take 1 tablet (10 mg total) by mouth daily.  30 tablet  11  . traZODone (DESYREL) 100 MG tablet Take 100 mg by mouth at bedtime.         No current facility-administered medications on file prior to visit.   Family History  Problem Relation Age of Onset  . Diabetes Maternal Grandmother   . Hypertension Maternal Grandmother    History   Social History  . Marital Status: Single    Spouse Name: N/A    Number of Children: N/A  . Years of Education: N/A   Occupational  History  . Not on file.   Social History Main Topics  . Smoking status: Never Smoker   . Smokeless tobacco: Never Used  . Alcohol Use: No  . Drug Use: No  . Sexual Activity: Not Currently     Comment: given condoms. Pt is an under.   Other Topics Concern  . Not on file   Social History Narrative  . No narrative on file    Review of Systems: Constitutional: Negative for fever, chills, diaphoresis, activity change, appetite change and fatigue. HENT: Negative for ear pain, nosebleeds, congestion, facial swelling, rhinorrhea, neck pain, neck stiffness and ear discharge.  Eyes: Negative for pain, discharge, redness, itching and visual disturbance. Cardiovascular: Negative for chest pain, palpitations and leg swelling. Gastrointestinal: Negative for abdominal distention. Genitourinary: Negative for dysuria, urgency, frequency, hematuria, flank pain, decreased urine volume, difficulty urinating and dyspareunia.  Musculoskeletal: Negative for back pain, joint swelling, arthralgia and gait problem. Neurological: Negative for dizziness, tremors, seizures, syncope, facial asymmetry, speech difficulty, weakness, light-headedness, numbness and headaches.  Hematological: Negative for adenopathy. Does not bruise/bleed easily. Psychiatric/Behavioral: Negative for hallucinations, behavioral problems, confusion, dysphoric mood, decreased concentration and agitation.    Objective:         Filed Vitals:   10/03/13 1426  BP: 109/84  Pulse: 104  Temp: 98 F (  36.7 C)  Resp: 20  SpO2 99%    Physical Exam: Constitutional: Patient appears well-developed and well-nourished. No distress. HENT: Normocephalic, atraumatic, External right and left ear normal. Oropharynx is clear and moist.  Eyes: Conjunctivae and EOM are normal. PERRLA, no scleral icterus. Neck: Normal ROM. Neck supple. No JVD. No tracheal deviation. No thyromegaly. CVS: RRR, S1/S2 +, no murmurs, no gallops, no carotid bruit.   Pulmonary: Effort and breath sounds normal, no stridor, rhonchi, rales. However expiratory wheezing present. Lymphadenopathy: No lymphadenopathy noted, cervical, inguinal or axillary Neuro: Alert. Normal reflexes, muscle tone coordination. No cranial nerve deficit. Skin: Skin is warm and dry. No rash noted. Not diaphoretic. No erythema. No pallor. Psychiatric: Normal mood and affect. Behavior, judgment, thought content normal.  Lab Results  Component Value Date   WBC 6.0 04/02/2013   HGB 14.7 04/02/2013   HCT 43.2 04/02/2013   MCV 89.3 04/02/2013   PLT 265 04/02/2013   Lab Results  Component Value Date   CREATININE 1.00 04/02/2013   BUN 12 04/02/2013   NA 135 04/02/2013   K 4.5 04/02/2013   CL 101 04/02/2013   CO2 29 04/02/2013    No results found for this basename: HGBA1C   Lipid Panel     Component Value Date/Time   CHOL 108 12/12/2012 1000   TRIG 107 12/12/2012 1000   HDL 23* 12/12/2012 1000   CHOLHDL 4.7 12/12/2012 1000   VLDL 21 12/12/2012 1000   LDLCALC 64 12/12/2012 1000        Assessment and plan:   1. Acute bronchitis, unspecified organism -CXR negative for acute process. Pt with expiratory wheezing.  - predniSONE (STERAPRED UNI-PAK) 10 MG tablet; Take by mouth daily. Take 60 mg PO daily x , then 50 mg PO daily x 1 , then 40 mg PO daily x 1, then 30 mg PO daily x 1, then 20 mg daily x 1, then 10 mg PO daily x 1 then stop.  Dispense: 21 tablet; Refill: 0 - azithromycin (ZITHROMAX) 250 MG tablet; 500 mg PO daily x 1, then 250 mg PO daily x 4  Dispense: 6 each; Refill: 0  Return for already scheduled appointment.  The patient was given clear instructions to go to ER or return to medical center if symptoms don't improve, worsen or new problems develop. The patient verbalized understanding. The patient was told to call to get lab results if they haven't heard anything in the next week.     This note has been created with Surveyor, quantity. Any  transcriptional errors are unintentional.    Kamali Sakata A., MD [place], [place]   10/03/2013, 5:20 PM

## 2013-10-03 NOTE — Telephone Encounter (Signed)
PFT Test has been set up @ [place]. [place] [facility] [phone]. Appointment date and time is set for July 30th @1P .M. Pt is comming in to be seen in clinic today,Will be notified with info when she arrives.

## 2013-10-08 NOTE — Progress Notes (Addendum)
Patient ID: Robert Dawson, male   DOB: 04/12/1975, 38 y.o.   MRN: [id number] TSH elevated at 7.268. Will repeat TSH fro confirmation adn check Free T4 also. Please notify patient that based on results of current labs she needs to come in for additional labs.

## 2013-10-08 NOTE — Addendum Note (Signed)
Addended by: Liston Alba A on: 10/08/2013 08:19 PM   Modules accepted: Orders

## 2013-10-09 ENCOUNTER — Telehealth: Payer: Self-pay

## 2013-10-09 NOTE — Telephone Encounter (Signed)
Pt was contacted and told to make an Appointment to have additional labs performed.Pt will be contacting office to make an appointment at her earliest convience.

## 2013-10-10 ENCOUNTER — Other Ambulatory Visit: Payer: Self-pay | Admitting: *Deleted

## 2013-10-15 ENCOUNTER — Other Ambulatory Visit: Payer: Self-pay

## 2013-10-15 ENCOUNTER — Telehealth: Payer: Self-pay

## 2013-10-15 DIAGNOSIS — B20 Human immunodeficiency virus [HIV] disease: Secondary | ICD-10-CM

## 2013-10-15 MED ORDER — ELVITEG-COBIC-EMTRICIT-TENOFDF 150-150-200-300 MG PO TABS: 1.0000 | ORAL_TABLET | Freq: Every day | ORAL | Status: DC

## 2013-10-15 NOTE — Telephone Encounter (Signed)
Patient states she was told her HIV medications would be delivered to our office. She called last week to request refill and  no one has returned call and only has 4 tablets left.   Pharmacy called today for refill request.  Delivery arrangements were preset by Langley Gauss, RN.   Patient was advised she can call the pharmacy with refill request as needed.  Patient advised we will call when it arrives.   Laverle Patter, RN

## 2013-10-16 NOTE — Telephone Encounter (Signed)
Patient called again today, asking about her medication delivery to [facility].  Langley Gauss, please return the call with information about the delivery options you set up for the patient. Landis Gandy, RN

## 2013-10-23 ENCOUNTER — Encounter: Payer: Self-pay | Admitting: Internal Medicine

## 2013-10-23 ENCOUNTER — Ambulatory Visit: Payer: Medicare Other | Admitting: Family Medicine

## 2013-10-23 ENCOUNTER — Other Ambulatory Visit: Payer: Medicare Other

## 2013-10-23 NOTE — Progress Notes (Unsigned)
Patient ID: Robert Dawson, male   DOB: 01/06/1976, 38 y.o.   MRN: [id number] Pt arrived at primary clinic stating having SOB, cough with bloody sputum; NP notified; pt advised to go to emergency department for evaluation of cough and SOB; pt verbalizes understanding

## 2013-10-25 ENCOUNTER — Encounter (HOSPITAL_COMMUNITY): Payer: Self-pay

## 2013-10-25 ENCOUNTER — Ambulatory Visit (HOSPITAL_COMMUNITY)
Admission: RE | Admit: 2013-10-25 | Discharge: 2013-10-25 | Disposition: A | Discharge status: Home / Self Care | Payer: Medicare Other | Source: Ambulatory Visit | Attending: Internal Medicine | Admitting: Internal Medicine

## 2013-10-25 ENCOUNTER — Ambulatory Visit (INDEPENDENT_AMBULATORY_CARE_PROVIDER_SITE_OTHER): Payer: Medicare Other | Admitting: Internal Medicine

## 2013-10-25 DIAGNOSIS — R1907 Generalized intra-abdominal and pelvic swelling, mass and lump: Secondary | ICD-10-CM

## 2013-10-25 DIAGNOSIS — R599 Enlarged lymph nodes, unspecified: Secondary | ICD-10-CM | POA: Diagnosis not present

## 2013-10-25 DIAGNOSIS — M25559 Pain in unspecified hip: Secondary | ICD-10-CM | POA: Insufficient documentation

## 2013-10-25 DIAGNOSIS — R062 Wheezing: Secondary | ICD-10-CM | POA: Diagnosis not present

## 2013-10-25 LAB — PULMONARY FUNCTION TEST
DL/VA % pred: 123 %
DL/VA: 5.7 ml/min/mmHg/L
DLCO UNC: 27.83 ml/min/mmHg
DLCO unc % pred: 89 %
FEF 25-75 Post: 0.95 L/sec
FEF 25-75 Pre: 0.99 L/sec
FEF2575-%CHANGE-POST: -3 %
FEF2575-%PRED-PRE: 26 %
FEF2575-%Pred-Post: 25 %
FEV1-%CHANGE-POST: 2 %
FEV1-%Pred-Post: 53 %
FEV1-%Pred-Pre: 51 %
FEV1-Post: 1.86 L
FEV1-Pre: 1.81 L
FEV1FVC-%Change-Post: 0 %
FEV1FVC-%Pred-Pre: 72 %
FEV6-%CHANGE-POST: 3 %
FEV6-%PRED-PRE: 71 %
FEV6-%Pred-Post: 74 %
FEV6-Post: 3.11 L
FEV6-Pre: 2.99 L
FEV6FVC-%Change-Post: 0 %
FEV6FVC-%PRED-POST: 101 %
FEV6FVC-%PRED-PRE: 100 %
FVC-%Change-Post: 3 %
FVC-%PRED-POST: 73 %
FVC-%Pred-Pre: 71 %
FVC-POST: 3.13 L
FVC-PRE: 3.04 L
POST FEV6/FVC RATIO: 99 %
PRE FEV6/FVC RATIO: 99 %
Post FEV1/FVC ratio: 59 %
Pre FEV1/FVC ratio: 59 %
RV % PRED: 117 %
RV: 2.06 L
TLC % PRED: 83 %
TLC: 5.59 L

## 2013-10-25 NOTE — Progress Notes (Signed)
PFT done today. 

## 2013-11-01 ENCOUNTER — Ambulatory Visit: Payer: Medicare Other | Admitting: Internal Medicine

## 2013-11-22 ENCOUNTER — Ambulatory Visit: Payer: Medicare Other | Admitting: Internal Medicine

## 2013-12-14 DIAGNOSIS — R0609 Other forms of dyspnea: Secondary | ICD-10-CM | POA: Diagnosis not present

## 2013-12-14 DIAGNOSIS — R0602 Shortness of breath: Secondary | ICD-10-CM | POA: Diagnosis not present

## 2013-12-14 DIAGNOSIS — R0902 Hypoxemia: Secondary | ICD-10-CM | POA: Diagnosis not present

## 2013-12-14 DIAGNOSIS — J45909 Unspecified asthma, uncomplicated: Secondary | ICD-10-CM | POA: Diagnosis not present

## 2013-12-14 DIAGNOSIS — R059 Cough, unspecified: Secondary | ICD-10-CM | POA: Diagnosis not present

## 2013-12-14 DIAGNOSIS — R05 Cough: Secondary | ICD-10-CM | POA: Diagnosis not present

## 2014-01-31 ENCOUNTER — Ambulatory Visit (INDEPENDENT_AMBULATORY_CARE_PROVIDER_SITE_OTHER): Payer: Medicare Other | Admitting: Internal Medicine

## 2014-01-31 ENCOUNTER — Encounter: Payer: Self-pay | Admitting: Internal Medicine

## 2014-01-31 VITALS — BP 132/99 | HR 106 | Temp 98.8°F | Wt 268.0 lb

## 2014-01-31 DIAGNOSIS — J984 Other disorders of lung: Secondary | ICD-10-CM | POA: Diagnosis not present

## 2014-01-31 DIAGNOSIS — D860 Sarcoidosis of lung: Secondary | ICD-10-CM | POA: Insufficient documentation

## 2014-01-31 DIAGNOSIS — R590 Localized enlarged lymph nodes: Secondary | ICD-10-CM | POA: Diagnosis not present

## 2014-01-31 DIAGNOSIS — R000 Tachycardia, unspecified: Secondary | ICD-10-CM | POA: Diagnosis not present

## 2014-01-31 DIAGNOSIS — R599 Enlarged lymph nodes, unspecified: Secondary | ICD-10-CM | POA: Diagnosis not present

## 2014-01-31 DIAGNOSIS — J439 Emphysema, unspecified: Secondary | ICD-10-CM

## 2014-01-31 DIAGNOSIS — R55 Syncope and collapse: Secondary | ICD-10-CM

## 2014-01-31 DIAGNOSIS — R946 Abnormal results of thyroid function studies: Secondary | ICD-10-CM

## 2014-01-31 DIAGNOSIS — Z23 Encounter for immunization: Secondary | ICD-10-CM | POA: Diagnosis not present

## 2014-01-31 DIAGNOSIS — J449 Chronic obstructive pulmonary disease, unspecified: Secondary | ICD-10-CM | POA: Diagnosis not present

## 2014-01-31 DIAGNOSIS — R062 Wheezing: Secondary | ICD-10-CM | POA: Diagnosis not present

## 2014-01-31 DIAGNOSIS — R05 Cough: Secondary | ICD-10-CM | POA: Diagnosis not present

## 2014-01-31 DIAGNOSIS — R7989 Other specified abnormal findings of blood chemistry: Secondary | ICD-10-CM

## 2014-01-31 DIAGNOSIS — R059 Cough, unspecified: Secondary | ICD-10-CM | POA: Insufficient documentation

## 2014-01-31 LAB — CBC WITH DIFFERENTIAL/PLATELET
Basophils Absolute: 0 10*3/uL (ref 0.0–0.1)
Basophils Relative: 0 % (ref 0–1)
Eosinophils Absolute: 0.1 10*3/uL (ref 0.0–0.7)
Eosinophils Relative: 2 % (ref 0–5)
HEMATOCRIT: 41.8 % (ref 39.0–52.0)
HEMOGLOBIN: 13.8 g/dL (ref 13.0–17.0)
Lymphocytes Relative: 37 % (ref 12–46)
Lymphs Abs: 1.9 10*3/uL (ref 0.7–4.0)
MCH: 30.2 pg (ref 26.0–34.0)
MCHC: 33 g/dL (ref 30.0–36.0)
MCV: 91.5 fL (ref 78.0–100.0)
MONO ABS: 0.6 10*3/uL (ref 0.1–1.0)
MONOS PCT: 11 % (ref 3–12)
NEUTROS ABS: 2.6 10*3/uL (ref 1.7–7.7)
Neutrophils Relative %: 50 % (ref 43–77)
Platelets: 277 10*3/uL (ref 150–400)
RBC: 4.57 MIL/uL (ref 4.22–5.81)
RDW: 14.7 % (ref 11.5–15.5)
WBC: 5.1 10*3/uL (ref 4.0–10.5)

## 2014-01-31 MED ORDER — ALBUTEROL SULFATE HFA 108 (90 BASE) MCG/ACT IN AERS: 2.0000 | INHALATION_SPRAY | Freq: Four times a day (QID) | RESPIRATORY_TRACT | Status: DC | PRN

## 2014-01-31 MED ORDER — BENZONATATE 100 MG PO CAPS: 100.0000 mg | ORAL_CAPSULE | Freq: Three times a day (TID) | ORAL | Status: DC | PRN

## 2014-01-31 NOTE — Progress Notes (Signed)
Patient ID: Talton Delpriore, male   DOB: March 06, 1976, 38 y.o.   MRN: [id number]   Bernie Ransford, is a 38 y.o. male  CZY:[id number]  UXN:[id number]  DOB - [birth year]  CC:  Chief Complaint  Patient presents with  . Results  . Shortness of Breath       HPI: Shadoe Bethel is a 38 y.o. male here to follow-up on her complaint of cough and wheezing. Pt had an appointment which she missed since she was out of town.   She reports that the cough was so sever that she had 3 episodes of cough syncope which was evaluated at [facility] while she was visiting her mother. She reports that she was seen in the ED and evaluated and found to have no blood clot to the lungs. She further reports that this is a small hospital and they wanted to send her to [place] but she refused. She reports that she was treated with steroids, albuterol and antibiotics. The cough is non-productive and is most prominent in the morning after waking. It improves during the day but she has "spell" that are so severe that it has caused syncope. She continues to have persistent cough and wheezing. She has been using Albuterol inhaler almost daily but recently has been out of inhalers. She had a course of steroids when she was seen in [place] but reports that it did not seem to improve the cough.   Pt had PFT's performed in July and was referred to Pulmonology as PFT''s revealed severe obstructive and mild restrictive lung disease. She has Silicone in the breasts and there is a concern as to whether the Silicone could be contributing to her symptoms.   She continues to have pain in the joints associated with the weight of silicone on her joints. We have discussed Plastic surgery referral to [facility] but patient does not have means to pursue this.   Also reviewed CT of abdomen and chest which shows lymphadenopathy. Pt does not want to pursue biopsy right now as she is overwhelmed with her respiratory issues.  Pt also noted to  have tachycardia. She had an elevated TSH and was ordered a repeat TSH adn Free T4 which she did not follow up on.   Patient has No headache, No chest pain, No abdominal pain - No Nausea, No new weakness tingling or numbness,   No Known Allergies Past Medical History  Diagnosis Date  . HIV disease   . Immune deficiency disorder   . Depression   . Allergy    Current Outpatient Prescriptions on File Prior to Visit  Medication Sig Dispense Refill  . elvitegravir-cobicistat-emtricitabine-tenofovir (STRIBILD) 150-150-200-300 MG TABS tablet Take 1 tablet by mouth daily with breakfast. 30 tablet 11  . ARIPiprazole (ABILIFY) 10 MG tablet Take 10 mg by mouth daily.      Marland Kitchen loratadine (CLARITIN) 10 MG tablet Take 1 tablet (10 mg total) by mouth daily. 30 tablet 11  . traZODone (DESYREL) 100 MG tablet Take 100 mg by mouth at bedtime.       No current facility-administered medications on file prior to visit.   Family History  Problem Relation Age of Onset  . Diabetes Maternal Grandmother   . Hypertension Maternal Grandmother    History   Social History  . Marital Status: Single    Spouse Name: N/A    Number of Children: N/A  . Years of Education: N/A   Occupational History  . Not on file.   Social History  Main Topics  . Smoking status: Never Smoker   . Smokeless tobacco: Never Used  . Alcohol Use: No  . Drug Use: No  . Sexual Activity: Not Currently     Comment: given condoms. Pt is an under.   Other Topics Concern  . Not on file   Social History Narrative    Review of Systems: Constitutional: Negative for fever, chills, diaphoresis, activity change, appetite change and fatigue. HENT: Negative for ear pain, nosebleeds, congestion, facial swelling, rhinorrhea, neck pain, neck stiffness and ear discharge.  Eyes: Negative for pain, discharge, redness, itching and visual disturbance. Cardiovascular: Negative for chest pain, palpitations and leg swelling. Gastrointestinal:  Negative for abdominal distention. Genitourinary: Negative for dysuria, urgency, frequency, hematuria, flank pain, decreased urine volume, difficulty urinating and dyspareunia.  Musculoskeletal: Negative for back pain, joint swelling, arthralgia and gait problem. Neurological: Negative for dizziness, tremors, seizures, syncope, facial asymmetry, speech difficulty, weakness, light-headedness, numbness and headaches.  Hematological: Negative for adenopathy. Does not bruise/bleed easily. Psychiatric/Behavioral: Negative for hallucinations, behavioral problems, confusion, dysphoric mood, decreased concentration and agitation.    Objective:   Filed Vitals:   01/31/14 1428  BP: 132/99  Pulse: 106  Temp: 98.8 F (37.1 C)    Physical Exam: Constitutional: Patient appears well-developed and well-nourished. No distress. HENT: Normocephalic, atraumatic, External right and left ear normal. Oropharynx is clear and moist.  Eyes: Conjunctivae and EOM are normal. PERRLA, no scleral icterus. Neck: Normal ROM. Neck supple. No JVD. No tracheal deviation. No thyromegaly. CVS: RRR, S1/S2 +, no murmurs, no gallops, no carotid bruit.  Pulmonary: Effort and breath sounds normal. no stridor, rales or rhonchi but patient has both inspiratory and expiratory wheezing.   Abdominal: Soft. BS +, no distension, tenderness, rebound or guarding.  Musculoskeletal: Normal range of motion. No edema and no tenderness.  Lymphadenopathy: No lymphadenopathy noted, cervical, inguinal or axillary Neuro: Alert. Normal reflexes, muscle tone coordination. No cranial nerve deficit. Skin: Skin is warm and dry. No rash noted. Not diaphoretic. No erythema. No pallor. Psychiatric: Normal mood and affect. Behavior, judgment, thought content normal.  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 Tachycardia - I am concerned that patient may have a PE however she insists that that was eliminated as a diagnosis while in the ED. However the presence of syncope is concerning. I will obtain records from this Hospital in [place] and review the records. This all occurred in early September and thus no urgency and patient has been stable and without any further episodes of syncope.   - TSH and Free T4.  - EKG 12-Lead  3. Cough/Cough syncope - benzonatate (TESSALON) 100 MG capsule; Take 1 capsule (100 mg total)  by mouth 3 (three) times daily as needed for cough.  Dispense: 20 capsule; Refill: 0  4. Wheezing - Pt has both inspiratory and expiratory wheezes.  - albuterol (PROVENTIL HFA;VENTOLIN HFA) 108 (90 BASE) MCG/ACT inhaler; Inhale 2 puffs into the lungs every 6 (six) hours as needed for wheezing or shortness of breath.  Dispense: 1 Inhaler; Refill: 6  5. Immunization due - Flu Vaccine QUAD 36+ mos PF IM (Fluarix Quad PF) - Tdap vaccine greater than or equal to 7yo IM   Return in about 2 months (around 04/02/2014).  The patient was given clear instructions to go to ER or return to medical center if symptoms don't improve, worsen or new problems develop. The patient verbalized understanding. The patient was told to call to get lab results if they haven't heard anything in the next week.     This note has been created with Surveyor, quantity. Any  transcriptional errors are unintentional.    MATTHEWS,MICHELLE A., MD [place], [place]   01/31/2014, 3:41 PM

## 2014-02-01 LAB — COMPREHENSIVE METABOLIC PANEL
ALBUMIN: 3.1 g/dL — AB (ref 3.5–5.2)
ALK PHOS: 97 U/L (ref 39–117)
ALT: 10 U/L (ref 0–53)
AST: 32 U/L (ref 0–37)
BUN: 9 mg/dL (ref 6–23)
CALCIUM: 9.1 mg/dL (ref 8.4–10.5)
CO2: 27 mEq/L (ref 19–32)
CREATININE: 1.23 mg/dL (ref 0.50–1.35)
Chloride: 100 mEq/L (ref 96–112)
GLUCOSE: 81 mg/dL (ref 70–99)
POTASSIUM: 4.6 meq/L (ref 3.5–5.3)
Sodium: 135 mEq/L (ref 135–145)
Total Bilirubin: 0.5 mg/dL (ref 0.2–1.2)
Total Protein: 7.9 g/dL (ref 6.0–8.3)

## 2014-02-01 LAB — T4, FREE: FREE T4: 1.05 ng/dL (ref 0.80–1.80)

## 2014-02-01 LAB — TSH: TSH: 2.256 u[IU]/mL (ref 0.350–4.500)

## 2014-02-04 ENCOUNTER — Other Ambulatory Visit: Payer: Self-pay | Admitting: Internal Medicine

## 2014-02-04 ENCOUNTER — Encounter: Payer: Self-pay | Admitting: Internal Medicine

## 2014-02-04 DIAGNOSIS — R55 Syncope and collapse: Secondary | ICD-10-CM | POA: Insufficient documentation

## 2014-02-04 DIAGNOSIS — R000 Tachycardia, unspecified: Secondary | ICD-10-CM

## 2014-02-04 DIAGNOSIS — R0602 Shortness of breath: Secondary | ICD-10-CM

## 2014-02-04 DIAGNOSIS — R7989 Other specified abnormal findings of blood chemistry: Secondary | ICD-10-CM

## 2014-02-04 NOTE — Progress Notes (Signed)
Patient ID: Robert Dawson, male   DOB: 05-30-1975, 38 y.o.   MRN: [id number] Records received from [facility] in [place] and reviewed. Pt was seen in the ED and had an elevated D-dimer and a subsequent CT angiogram of the chest that was non-diagnostic for PE as there was insufficient contrast in the pulmonary tree to confirm or exclude the presence of PE. However the D-Dimer was markedly elevated.  She was treated as Acute Bronchitis based on medications given as a discharge diagnosis is not in the record.  Review of CT report also   In light of findings in review of record, I will order a CTA of chest to R/O PE as the clinical findings of tachycardia and syncope in addition to feeling of SOB raises my index of suspicion.

## 2014-02-04 NOTE — Progress Notes (Signed)
Records received from [facility] in [place] and reviewed. Pt was seen in the ED and had an  and elevated HR 116 and  other diagnosis less likely, the score of 4.5 points Per Wells criteria there is a likelihood of PE. A subsequent D-dimer (2000) was  Elevated and a subsequent CT angiogram of the chest that was non-diagnostic for PE as there was insufficient contrast in the pulmonary tree to confirm or exclude the presence of PE.   She was treated as Acute Bronchitis based on medications given as a discharge diagnosis is not in the record.  Review of CT report also   In light of findings (4.5 on Wells Criteria, Elevated D-dimer, syncope and inconclusive CTA because of lack of contrast found in  review of record, I will order a CTA of chest to R/O PE as I suspect that the D-Dimer will be elevated secondary to the silicone injections and this will not be of much help. Additionally, the clinical findings of tachycardia and syncope in addition to feeling of SOB raises my index of suspicion.

## 2014-02-05 ENCOUNTER — Other Ambulatory Visit: Payer: Self-pay | Admitting: Internal Medicine

## 2014-02-07 ENCOUNTER — Encounter (HOSPITAL_COMMUNITY): Payer: Self-pay

## 2014-02-07 ENCOUNTER — Ambulatory Visit (HOSPITAL_COMMUNITY)
Admission: RE | Admit: 2014-02-07 | Discharge: 2014-02-07 | Disposition: A | Discharge status: Home / Self Care | Payer: Medicare Other | Source: Ambulatory Visit | Attending: Internal Medicine | Admitting: Internal Medicine

## 2014-02-07 DIAGNOSIS — R0602 Shortness of breath: Secondary | ICD-10-CM | POA: Diagnosis not present

## 2014-02-07 DIAGNOSIS — R000 Tachycardia, unspecified: Secondary | ICD-10-CM

## 2014-02-07 DIAGNOSIS — R55 Syncope and collapse: Secondary | ICD-10-CM

## 2014-02-07 DIAGNOSIS — R599 Enlarged lymph nodes, unspecified: Secondary | ICD-10-CM | POA: Insufficient documentation

## 2014-02-07 DIAGNOSIS — R7989 Other specified abnormal findings of blood chemistry: Secondary | ICD-10-CM | POA: Insufficient documentation

## 2014-02-07 DIAGNOSIS — R791 Abnormal coagulation profile: Secondary | ICD-10-CM | POA: Diagnosis not present

## 2014-02-07 MED ORDER — IOHEXOL 350 MG/ML SOLN
100.0000 mL | Freq: Once | INTRAVENOUS | Status: AC | PRN
  Administered 2014-02-07: 100 mL via INTRAVENOUS

## 2014-04-01 ENCOUNTER — Telehealth: Payer: Self-pay | Admitting: Internal Medicine

## 2014-04-01 NOTE — Telephone Encounter (Signed)
Called and spoke with patient and advised patient that they needed an appointment to be evaluated for this. Patient decided to keep appointment which is scheduled on 04/04/14. Thanks!

## 2014-04-01 NOTE — Telephone Encounter (Signed)
Patient needs something prescribed for congestion. Has already tried OTC meds.

## 2014-04-04 ENCOUNTER — Institutional Professional Consult (permissible substitution): Payer: Medicare Other | Admitting: Internal Medicine

## 2014-04-04 ENCOUNTER — Ambulatory Visit: Payer: Medicare Other | Admitting: Internal Medicine

## 2014-04-05 ENCOUNTER — Institutional Professional Consult (permissible substitution): Payer: Medicare Other | Admitting: Internal Medicine

## 2014-05-23 ENCOUNTER — Institutional Professional Consult (permissible substitution): Payer: Medicare Other | Admitting: Internal Medicine

## 2014-05-25 ENCOUNTER — Emergency Department (HOSPITAL_COMMUNITY)
Admission: EM | Admit: 2014-05-25 | Discharge: 2014-05-25 | Disposition: A | Discharge status: Home / Self Care | Payer: Medicare Other | Attending: Emergency Medicine | Admitting: Emergency Medicine

## 2014-05-25 ENCOUNTER — Encounter (HOSPITAL_COMMUNITY): Payer: Self-pay | Admitting: Emergency Medicine

## 2014-05-25 ENCOUNTER — Emergency Department (HOSPITAL_COMMUNITY): Payer: Medicare Other

## 2014-05-25 DIAGNOSIS — Z862 Personal history of diseases of the blood and blood-forming organs and certain disorders involving the immune mechanism: Secondary | ICD-10-CM | POA: Insufficient documentation

## 2014-05-25 DIAGNOSIS — R062 Wheezing: Secondary | ICD-10-CM | POA: Diagnosis present

## 2014-05-25 DIAGNOSIS — J9801 Acute bronchospasm: Secondary | ICD-10-CM | POA: Diagnosis not present

## 2014-05-25 DIAGNOSIS — Z79899 Other long term (current) drug therapy: Secondary | ICD-10-CM | POA: Diagnosis not present

## 2014-05-25 DIAGNOSIS — R05 Cough: Secondary | ICD-10-CM | POA: Diagnosis not present

## 2014-05-25 DIAGNOSIS — R0602 Shortness of breath: Secondary | ICD-10-CM

## 2014-05-25 DIAGNOSIS — R918 Other nonspecific abnormal finding of lung field: Secondary | ICD-10-CM | POA: Diagnosis not present

## 2014-05-25 DIAGNOSIS — Z8659 Personal history of other mental and behavioral disorders: Secondary | ICD-10-CM | POA: Diagnosis not present

## 2014-05-25 DIAGNOSIS — R591 Generalized enlarged lymph nodes: Secondary | ICD-10-CM | POA: Diagnosis not present

## 2014-05-25 DIAGNOSIS — Z21 Asymptomatic human immunodeficiency virus [HIV] infection status: Secondary | ICD-10-CM | POA: Insufficient documentation

## 2014-05-25 LAB — CBC WITH DIFFERENTIAL/PLATELET
Basophils Absolute: 0 10*3/uL (ref 0.0–0.1)
Basophils Relative: 0 % (ref 0–1)
EOS ABS: 0.2 10*3/uL (ref 0.0–0.7)
Eosinophils Relative: 3 % (ref 0–5)
HEMATOCRIT: 42.3 % (ref 39.0–52.0)
Hemoglobin: 14 g/dL (ref 13.0–17.0)
LYMPHS ABS: 2.8 10*3/uL (ref 0.7–4.0)
Lymphocytes Relative: 40 % (ref 12–46)
MCH: 29.7 pg (ref 26.0–34.0)
MCHC: 33.1 g/dL (ref 30.0–36.0)
MCV: 89.6 fL (ref 78.0–100.0)
Monocytes Absolute: 0.9 10*3/uL (ref 0.1–1.0)
Monocytes Relative: 13 % — ABNORMAL HIGH (ref 3–12)
Neutro Abs: 3.1 10*3/uL (ref 1.7–7.7)
Neutrophils Relative %: 44 % (ref 43–77)
Platelets: 266 10*3/uL (ref 150–400)
RBC: 4.72 MIL/uL (ref 4.22–5.81)
RDW: 14.6 % (ref 11.5–15.5)
WBC: 7 10*3/uL (ref 4.0–10.5)

## 2014-05-25 LAB — COMPREHENSIVE METABOLIC PANEL
ALT: 17 U/L (ref 0–53)
AST: 43 U/L — ABNORMAL HIGH (ref 0–37)
Albumin: 3.2 g/dL — ABNORMAL LOW (ref 3.5–5.2)
Alkaline Phosphatase: 110 U/L (ref 39–117)
Anion gap: 8 (ref 5–15)
BILIRUBIN TOTAL: 0.4 mg/dL (ref 0.3–1.2)
BUN: 12 mg/dL (ref 6–23)
CO2: 26 mmol/L (ref 19–32)
Calcium: 8.9 mg/dL (ref 8.4–10.5)
Chloride: 103 mmol/L (ref 96–112)
Creatinine, Ser: 1.1 mg/dL (ref 0.50–1.35)
GFR calc non Af Amer: 82 mL/min — ABNORMAL LOW (ref >90)
Glucose, Bld: 83 mg/dL (ref 70–99)
Potassium: 3.9 mmol/L (ref 3.5–5.1)
Sodium: 137 mmol/L (ref 135–145)
Total Protein: 8.6 g/dL — ABNORMAL HIGH (ref 6.0–8.3)

## 2014-05-25 MED ORDER — METHYLPREDNISOLONE SODIUM SUCC 125 MG IJ SOLR
125.0000 mg | Freq: Once | INTRAMUSCULAR | Status: AC
  Administered 2014-05-25: 125 mg via INTRAVENOUS
  Filled 2014-05-25: qty 2

## 2014-05-25 MED ORDER — ALBUTEROL SULFATE (2.5 MG/3ML) 0.083% IN NEBU
2.5000 mg | INHALATION_SOLUTION | Freq: Once | RESPIRATORY_TRACT | Status: AC
  Administered 2014-05-25: 2.5 mg via RESPIRATORY_TRACT
  Filled 2014-05-25: qty 3

## 2014-05-25 MED ORDER — PREDNISONE 20 MG PO TABS: 60.0000 mg | ORAL_TABLET | Freq: Once | ORAL | Status: DC

## 2014-05-25 MED ORDER — PREDNISONE 10 MG PO TABS: 20.0000 mg | ORAL_TABLET | Freq: Every day | ORAL | Status: DC

## 2014-05-25 MED ORDER — ALBUTEROL SULFATE HFA 108 (90 BASE) MCG/ACT IN AERS
2.0000 | INHALATION_SPRAY | RESPIRATORY_TRACT | Status: DC | PRN
  Filled 2014-05-25: qty 6.7

## 2014-05-25 MED ORDER — IPRATROPIUM-ALBUTEROL 0.5-2.5 (3) MG/3ML IN SOLN
3.0000 mL | Freq: Once | RESPIRATORY_TRACT | Status: AC
  Administered 2014-05-25: 3 mL via RESPIRATORY_TRACT
  Filled 2014-05-25: qty 3

## 2014-05-25 MED ORDER — LEVOFLOXACIN 500 MG PO TABS: 500.0000 mg | ORAL_TABLET | Freq: Every day | ORAL | Status: DC

## 2014-05-25 NOTE — ED Notes (Signed)
Pt states that she has been having wheezing and coughing on and off for several months.  States that SOB has gotten worse over the past couple days and has been having coughing spells that cause her to pass out.  Obvious wheezing in triage.

## 2014-05-25 NOTE — ED Provider Notes (Signed)
CSN: [id number]     Arrival date & time 05/25/14  0917 History   First MD Initiated Contact with Patient 05/25/14 0935     Chief Complaint  Patient presents with  . Wheezing     (Consider location/radiation/quality/duration/timing/severity/associated sxs/prior Treatment) Patient is a 39 y.o. male presenting with wheezing. The history is provided by the patient (pt complains of sob and wheezing).  Wheezing Severity:  Moderate Severity compared to prior episodes:  Similar Onset quality:  Gradual Timing:  Constant Progression:  Waxing and waning Chronicity:  Recurrent Context: animal exposure   Associated symptoms: no chest pain, no cough, no fatigue, no headaches and no rash     Past Medical History  Diagnosis Date  . HIV disease   . Immune deficiency disorder   . Depression   . Allergy    Past Surgical History  Procedure Laterality Date  . Silicone injections Bilateral y-18    Breasts and hips- [facility] market   Family History  Problem Relation Age of Onset  . Diabetes Maternal Grandmother   . Hypertension Maternal Grandmother    History  Substance Use Topics  . Smoking status: Never Smoker   . Smokeless tobacco: Never Used  . Alcohol Use: No    Review of Systems  Constitutional: Negative for appetite change and fatigue.  HENT: Negative for congestion, ear discharge and sinus pressure.   Eyes: Negative for discharge.  Respiratory: Positive for wheezing. Negative for cough.   Cardiovascular: Negative for chest pain.  Gastrointestinal: Negative for abdominal pain and diarrhea.  Genitourinary: Negative for frequency and hematuria.  Musculoskeletal: Negative for back pain.  Skin: Negative for rash.  Neurological: Negative for seizures and headaches.  Psychiatric/Behavioral: Negative for hallucinations.      Allergies  Review of patient's allergies indicates no known allergies.  Home Medications   Prior to Admission medications   Medication Sig Start Date  End Date Taking? Authorizing Provider  albuterol (PROVENTIL HFA;VENTOLIN HFA) 108 (90 BASE) MCG/ACT inhaler Inhale 2 puffs into the lungs every 6 (six) hours as needed for wheezing or shortness of breath. 01/31/14  Yes Leana Gamer, MD  elvitegravir-cobicistat-emtricitabine-tenofovir (STRIBILD) 150-150-200-300 MG TABS tablet Take 1 tablet by mouth daily with breakfast. 10/15/13  Yes Michel Bickers, MD  benzonatate (TESSALON) 100 MG capsule Take 1 capsule (100 mg total) by mouth 3 (three) times daily as needed for cough. 01/31/14   Leana Gamer, MD  levofloxacin (LEVAQUIN) 500 MG tablet Take 1 tablet (500 mg total) by mouth daily. 05/25/14   Maudry Diego, MD  loratadine (CLARITIN) 10 MG tablet Take 1 tablet (10 mg total) by mouth daily. 09/20/13   Leana Gamer, MD  predniSONE (DELTASONE) 10 MG tablet Take 2 tablets (20 mg total) by mouth daily. 05/25/14   Maudry Diego, MD   BP 147/98 mmHg  Pulse 118  Temp(Src) 97.5 F (36.4 C) (Oral)  Resp 30  SpO2 99% Physical Exam  Constitutional: He is oriented to person, place, and time. He appears well-developed.  HENT:  Head: Normocephalic.  Eyes: Conjunctivae and EOM are normal. No scleral icterus.  Neck: Neck supple. No thyromegaly present.  Cardiovascular: Normal rate and regular rhythm.  Exam reveals no gallop and no friction rub.   No murmur heard. Pulmonary/Chest: No stridor. He has wheezes. He has no rales. He exhibits no tenderness.  Abdominal: He exhibits no distension. There is no tenderness. There is no rebound.  Musculoskeletal: Normal range of motion. He exhibits no edema.  Lymphadenopathy:    He has no cervical adenopathy.  Neurological: He is oriented to person, place, and time. He exhibits normal muscle tone. Coordination normal.  Skin: No rash noted. No erythema.  Psychiatric: He has a normal mood and affect. His behavior is normal.    ED Course  Procedures (including critical care time) Labs Review Labs  Reviewed  CBC WITH DIFFERENTIAL/PLATELET - Abnormal; Notable for the following:    Monocytes Relative 13 (*)    All other components within normal limits  COMPREHENSIVE METABOLIC PANEL - Abnormal; Notable for the following:    Total Protein 8.6 (*)    Albumin 3.2 (*)    AST 43 (*)    GFR calc non Af Amer 82 (*)    All other components within normal limits    Imaging Review Dg Chest 2 View  05/25/2014   CLINICAL DATA:  Cough and right-sided chest pain, shortness of breath  EXAM: CHEST  2 VIEW  COMPARISON:  02/07/2014  FINDINGS: Cardiac shadow is within normal limits. The lungs are well aerated bilaterally. Patchy multifocal opacities are again identified particularly in the left lung base. Mild bilateral hilar adenopathy is noted. These changes are similar to that seen on prior CT examination. This raises suspicion for possible underlying sarcoidosis. No sizable effusion is noted.  IMPRESSION: Bilateral lymphadenopathy in the hila as well as left basilar patchy infiltrate. This raises suspicion for possible sarcoidosis given its relative chronicity. Alternatively this may represent recurrent multifocal pneumonia.   Electronically Signed   By: Inez Catalina M.D.   On: 05/25/2014 10:40     EKG Interpretation None      MDM   Final diagnoses:  SOB (shortness of breath)  Bronchospasm    Pt improved with neb tx.   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    Maudry Diego, MD 05/25/14 [phone]

## 2014-05-25 NOTE — Discharge Instructions (Signed)
Call your family md to get an appointment with the lung md.  If you can not see the lung md this week then you need to see your md this week.

## 2014-05-28 ENCOUNTER — Institutional Professional Consult (permissible substitution): Payer: Medicare Other | Admitting: Internal Medicine

## 2014-06-10 ENCOUNTER — Other Ambulatory Visit: Payer: Self-pay | Admitting: *Deleted

## 2014-06-10 DIAGNOSIS — Z113 Encounter for screening for infections with a predominantly sexual mode of transmission: Secondary | ICD-10-CM

## 2014-07-15 ENCOUNTER — Ambulatory Visit (INDEPENDENT_AMBULATORY_CARE_PROVIDER_SITE_OTHER)
Admission: RE | Admit: 2014-07-15 | Discharge: 2014-07-15 | Disposition: A | Discharge status: Home / Self Care | Payer: Medicare Other | Source: Ambulatory Visit | Attending: Internal Medicine | Admitting: Internal Medicine

## 2014-07-15 ENCOUNTER — Encounter: Payer: Self-pay | Admitting: Internal Medicine

## 2014-07-15 ENCOUNTER — Ambulatory Visit (INDEPENDENT_AMBULATORY_CARE_PROVIDER_SITE_OTHER): Payer: Medicare Other | Admitting: Internal Medicine

## 2014-07-15 ENCOUNTER — Encounter (INDEPENDENT_AMBULATORY_CARE_PROVIDER_SITE_OTHER): Payer: Self-pay

## 2014-07-15 VITALS — BP 115/78 | HR 93 | Ht 69.0 in | Wt 260.0 lb

## 2014-07-15 DIAGNOSIS — D869 Sarcoidosis, unspecified: Secondary | ICD-10-CM

## 2014-07-15 DIAGNOSIS — R590 Localized enlarged lymph nodes: Secondary | ICD-10-CM

## 2014-07-15 DIAGNOSIS — J440 Chronic obstructive pulmonary disease with acute lower respiratory infection: Secondary | ICD-10-CM | POA: Diagnosis not present

## 2014-07-15 DIAGNOSIS — R059 Cough, unspecified: Secondary | ICD-10-CM

## 2014-07-15 DIAGNOSIS — R599 Enlarged lymph nodes, unspecified: Secondary | ICD-10-CM | POA: Diagnosis not present

## 2014-07-15 DIAGNOSIS — R05 Cough: Secondary | ICD-10-CM

## 2014-07-15 DIAGNOSIS — R0602 Shortness of breath: Secondary | ICD-10-CM | POA: Diagnosis not present

## 2014-07-15 MED ORDER — ALBUTEROL SULFATE (2.5 MG/3ML) 0.083% IN NEBU: 2.5000 mg | INHALATION_SOLUTION | RESPIRATORY_TRACT | Status: DC | PRN

## 2014-07-15 MED ORDER — COMPRESSOR NEBULIZER MISC: 1.0000 | Freq: Four times a day (QID) | Status: DC | PRN

## 2014-07-15 MED ORDER — ALBUTEROL SULFATE (2.5 MG/3ML) 0.083% IN NEBU: 2.5000 mg | INHALATION_SOLUTION | Freq: Four times a day (QID) | RESPIRATORY_TRACT | Status: DC | PRN

## 2014-07-15 MED ORDER — BENZONATATE 200 MG PO CAPS: 200.0000 mg | ORAL_CAPSULE | Freq: Three times a day (TID) | ORAL | Status: DC | PRN

## 2014-07-15 MED ORDER — UMECLIDINIUM-VILANTEROL 62.5-25 MCG/INH IN AEPB: INHALATION_SPRAY | RESPIRATORY_TRACT | Status: DC

## 2014-07-15 NOTE — Patient Instructions (Signed)
Order lab- ACE level. Quantiferon gold       Dx sarcoid  Order- CXR   Dx hilar adenopathy, pneumonia NOS  Sample and script Anoro inhaler  1 puff once daily  Script for nebulizer and albuterol nebulizer solution, to use up to 4 times daily as needed for wheezing/ asthmatic bronchitis  Script benzonatate perles for cough as needed  Put one brick under each head-end leg of your bed. This will raise the head of your bed and cut down how much you are refluxing.

## 2014-07-15 NOTE — Progress Notes (Signed)
07/15/14- 39 yo M/ F transgender never smoker w/ hx syphilis, HIV  referred by Dr. Zigmund Daniel; Wheezing; Choking sensation; hard to breathe; Syncope episodes after coughing so hard it caused her to faint; unable to eat, will throw up her food, has lost 24lbs due to inability to eat; History of asthma since childhood.  PFT done 10/25/2013- severe obstructive airways disease- with insignificant response to bronchodilator, possible restriction, normal diffusion FVC 3.13/73%, FEV1 1.86/53%, FEV1/FVC 0.59, FEF 25-75 percent 0.95, TLC 83%, DLCO 89%. She is aware of active reflux with easy shortness of breath especially while coughing. Scant white sputum. Little heartburn. Some wheeze but albuterol inhaler is not much help. Wakes at night coughing and choking short of breath if she lies on left side. CT chest 02/07/14-  IMPRESSION: No evidence of pulmonary embolism. Multifocal patchy/ nodular opacities, left lower lobe predominant, suspicious for multifocal pneumonia. Mediastinal/bilateral hilar lymphadenopathy, likely reactive. Electronically Signed  By: Julian Hy M.D.  On: 02/07/2014 11: CXR 05/25/14 IMPRESSION: Bilateral lymphadenopathy in the hila as well as left basilar patchy infiltrate. This raises suspicion for possible sarcoidosis given its relative chronicity. Alternatively this may represent recurrent multifocal pneumonia. Electronically Signed  By: Inez Catalina M.D.  On: 05/25/2014 10:40  Prior to Admission medications   Medication Sig Start Date End Date Taking? Authorizing Provider  albuterol (PROVENTIL HFA;VENTOLIN HFA) 108 (90 BASE) MCG/ACT inhaler Inhale 2 puffs into the lungs every 6 (six) hours as needed for wheezing or shortness of breath. 01/31/14  Yes Leana Gamer, MD  STRIBILD 150-150-200-300 MG TABS tablet  07/07/14  Yes Historical Provider, MD  albuterol (PROVENTIL) (2.5 MG/3ML) 0.083% nebulizer solution Take 3 mLs (2.5 mg total) by nebulization every 6  (six) hours as needed for wheezing or shortness of breath. 07/15/14   Deneise Lever, MD  albuterol (PROVENTIL) (2.5 MG/3ML) 0.083% nebulizer solution Take 3 mLs (2.5 mg total) by nebulization every 4 (four) hours as needed for wheezing or shortness of breath. 07/15/14   Deneise Lever, MD  benzonatate (TESSALON) 200 MG capsule Take 1 capsule (200 mg total) by mouth 3 (three) times daily as needed for cough. 07/15/14   Deneise Lever, MD  Nebulizers (COMPRESSOR NEBULIZER) MISC 1 Device by Does not apply route every 6 (six) hours as needed. 07/15/14   Deneise Lever, MD  Umeclidinium-Vilanterol 62.5-25 MCG/INH AEPB 1 puff, once daily 07/15/14   Deneise Lever, MD   Past Medical History  Diagnosis Date  . HIV disease   . Immune deficiency disorder   . Depression   . Allergy    Past Surgical History  Procedure Laterality Date  . Silicone injections Bilateral y-18    Breasts and hips- black market   Family History  Problem Relation Age of Onset  . Diabetes Maternal Grandmother   . Hypertension Maternal Grandmother    History   Social History  . Marital Status: Single    Spouse Name: N/A  . Number of Children: N/A  . Years of Education: N/A   Occupational History  . Not on file.   Social History Main Topics  . Smoking status: Never Smoker   . Smokeless tobacco: Never Used  . Alcohol Use: No  . Drug Use: No  . Sexual Activity: Not Currently     Comment: given condoms. Pt is an under.   Other Topics Concern  . Not on file   Social History Narrative   ROS-see HPI   Negative unless "+" Constitutional:    +  weight loss, night sweats, fevers, chills, fatigue, lassitude. HEENT:    headaches, difficulty swallowing, tooth/dental problems, sore throat,       sneezing, itching, ear ache, nasal congestion, post nasal drip, snoring CV:    chest pain, orthopnea, PND, swelling in lower extremities, anasarca,                                  dizziness, palpitations Resp:   +shortness of  breath with exertion or at rest.                +productive cough,   non-productive cough, coughing up of blood.              change in color of mucus.  wheezing.   Skin:    rash or lesions. GI: + heartburn, indigestion, abdominal pain, nausea, vomiting, diarrhea,                 change in bowel habits, loss of appetite GU: dysuria, change in color of urine, no urgency or frequency.   flank pain. MS:   joint pain, stiffness, decreased range of motion, back pain. Neuro-     nothing unusual Psych:  change in mood or affect.  depression or anxiety.   memory loss.  OBJ- Physical Exam  +presents as male General- Alert, Oriented, Affect-appropriate, Distress- none acute Skin- rash-none, lesions- none, excoriation- none, + tattoos Lymphadenopathy- none Head- atraumatic            Eyes- Gross vision intact, PERRLA, conjunctivae and secretions clear            Ears- Hearing, canals-normal            Nose- Clear, no-Septal dev, mucus, polyps, erosion, perforation             Throat- Mallampati II , mucosa clear , drainage- none, tonsils- atrophic Neck- flexible , trachea midline, no stridor , thyroid nl, carotid no bruit Chest - symmetrical excursion , unlabored           Heart/CV- RRR , no murmur , no gallop  , no rub, nl s1 s2                           - JVD- none , edema- none, stasis changes- none, varices- none           Lung- + coarse wheezes/rhonchi, cough + raspy , dullness-none, rub- none           Chest wall- + implants Abd-  Br/ Gen/ Rectal- Not done, not indicated Extrem- cyanosis- none, clubbing, none, atrophy- none, strength- nl Neuro- grossly intact to observation

## 2014-07-16 ENCOUNTER — Telehealth: Payer: Self-pay | Admitting: Internal Medicine

## 2014-07-16 ENCOUNTER — Encounter: Payer: Self-pay | Admitting: *Deleted

## 2014-07-16 DIAGNOSIS — J4489 Other specified chronic obstructive pulmonary disease: Secondary | ICD-10-CM | POA: Insufficient documentation

## 2014-07-16 DIAGNOSIS — J449 Chronic obstructive pulmonary disease, unspecified: Secondary | ICD-10-CM | POA: Insufficient documentation

## 2014-07-16 NOTE — Telephone Encounter (Signed)
Spoke with [facility]  Rx sent to [facility] for nebulizer machine/nebs and insurance will not cover with dx of Sarcoidosis (D86.9).  Needs to be a J-Code attached (ICD-10) Please advise if there is an alternate dx to use. Thanks.

## 2014-07-16 NOTE — Telephone Encounter (Signed)
Pt calling back hqving problem w getting med w/cards pt is @ pharm says the only thing they will cover nebulizer everything else they are not covering please advise.Robert Dawson

## 2014-07-16 NOTE — Assessment & Plan Note (Signed)
Cough most likely reflects chronic bronchitis from recurrent aspiration. Blood other etiologies must be kept in mind including esophageal disease. Bilateral lower zone infiltrates look consistent with aspiration but need to be watched over time. Sputum culture and Bronchoscopy can be considered.

## 2014-07-16 NOTE — Telephone Encounter (Signed)
No CPAP. No dx OSA. We ordered a nebulizer machine with albuterol. If this was set up through a DME, she will have to get the machine and med from them. She needs to get Delsym, or whatever otc cough med her insurance will help with, otc when she can. We can't maintain her with samples. Her basic problem is recurrent reflux and aspiration. She needs to work with her primary care person to manage that.

## 2014-07-16 NOTE — Telephone Encounter (Signed)
Patient advised.  Sent letter to Dr. Zigmund Daniel per patient's request so Dr. Zigmund Daniel can provide patient with medication for reflux.  At this time there is nothing more that can be done, awaiting nebulizer to be sent to patient.  [facility], please advise patient when Nebulizer will be sent.  Thanks. Nothing further needed.

## 2014-07-16 NOTE — Assessment & Plan Note (Signed)
Undocumented history of asthma earlier in life but most of the current pattern best fits chronic bronchitis related to recurrent aspiration events. Plan-chest x-ray, try nebulizer machine with albuterol

## 2014-07-16 NOTE — Telephone Encounter (Signed)
Patient says that she cannot get any of her medications, insurance will not pay for the [employer].  The only thing insurance will pay for is the nebulizer.  She wants to know what she should do about the cough.  She doesn't have any money, I asked her if she has tried Delsym and she said she cant afford it.  Patient would also like to know when her CPAP will be delivered.    Current Outpatient Prescriptions on File Prior to Visit  Medication Sig Dispense Refill  . albuterol (PROVENTIL HFA;VENTOLIN HFA) 108 (90 BASE) MCG/ACT inhaler Inhale 2 puffs into the lungs every 6 (six) hours as needed for wheezing or shortness of breath. 1 Inhaler 6  . albuterol (PROVENTIL) (2.5 MG/3ML) 0.083% nebulizer solution Take 3 mLs (2.5 mg total) by nebulization every 6 (six) hours as needed for wheezing or shortness of breath. 75 mL 12  . albuterol (PROVENTIL) (2.5 MG/3ML) 0.083% nebulizer solution Take 3 mLs (2.5 mg total) by nebulization every 4 (four) hours as needed for wheezing or shortness of breath. 75 mL 12  . benzonatate (TESSALON) 200 MG capsule Take 1 capsule (200 mg total) by mouth 3 (three) times daily as needed for cough. 30 capsule 1  . Nebulizers (COMPRESSOR NEBULIZER) MISC 1 Device by Does not apply route every 6 (six) hours as needed. 1 each 0  . STRIBILD 150-150-200-300 MG TABS tablet   6  . Umeclidinium-Vilanterol 62.5-25 MCG/INH AEPB 1 puff, once daily 1 each prn   No current facility-administered medications on file prior to visit.   No Known Allergies  Dr. Annamaria Boots, please advise about medication for cough. [place]: please advise about CPAP.

## 2014-07-16 NOTE — Assessment & Plan Note (Signed)
Sarcoid is a possibility but infectious or inflammatory etiology related to HIV disease must be considered. Plan- Quant TB gold assay, ACE level, update CXR

## 2014-07-16 NOTE — Telephone Encounter (Signed)
LMTCB

## 2014-07-16 NOTE — Telephone Encounter (Signed)
Pt returned call. Informed pt we are currently working on getting the neb machine and meds covered and once CY responds, we will call her back. Pt verbalized understanding.

## 2014-07-16 NOTE — Telephone Encounter (Signed)
Will need to make patient aware when call back that we are working on coverage.  Virl Cagey, CMA at 07/16/2014 12:12 PM     Status: Sign at exiting of workspace       Expand All Collapse All   Spoke with Kim-[facility]  Rx sent to [facility] for nebul[facility] machine/nebs and insurance will not cover with dx of Sarcoidosis (D86.9).  Needs to be a J-Code attached (ICD-10) Please advise if there is an alternate dx to use. Thanks.

## 2014-07-16 NOTE — Telephone Encounter (Signed)
Spoke with patient.  Patient was concerned about the prescriptions that were sent to [facility].  Insurance will not cover [employer] or Albuterol.  Called and spoke to [facility] and they asked if patient has Medicare Part B.  Patient checked her insurance card and it said part B.  Patient is taking that card to [facility] to get them to run through the medication to see if she can get the medication.  Patient will let me know if there is any problems.

## 2014-07-17 ENCOUNTER — Emergency Department (HOSPITAL_COMMUNITY)
Admission: EM | Admit: 2014-07-17 | Discharge: 2014-07-17 | Disposition: A | Discharge status: Home / Self Care | Payer: Medicare Other | Attending: Emergency Medicine | Admitting: Emergency Medicine

## 2014-07-17 ENCOUNTER — Encounter (HOSPITAL_COMMUNITY): Payer: Self-pay

## 2014-07-17 ENCOUNTER — Other Ambulatory Visit: Payer: Medicare Other

## 2014-07-17 ENCOUNTER — Telehealth: Payer: Self-pay | Admitting: Internal Medicine

## 2014-07-17 ENCOUNTER — Emergency Department (HOSPITAL_COMMUNITY): Payer: Medicare Other

## 2014-07-17 DIAGNOSIS — R0602 Shortness of breath: Secondary | ICD-10-CM | POA: Insufficient documentation

## 2014-07-17 DIAGNOSIS — D869 Sarcoidosis, unspecified: Secondary | ICD-10-CM

## 2014-07-17 DIAGNOSIS — Z8659 Personal history of other mental and behavioral disorders: Secondary | ICD-10-CM | POA: Diagnosis not present

## 2014-07-17 DIAGNOSIS — R062 Wheezing: Secondary | ICD-10-CM | POA: Diagnosis not present

## 2014-07-17 DIAGNOSIS — Z21 Asymptomatic human immunodeficiency virus [HIV] infection status: Secondary | ICD-10-CM | POA: Insufficient documentation

## 2014-07-17 DIAGNOSIS — R590 Localized enlarged lymph nodes: Secondary | ICD-10-CM

## 2014-07-17 DIAGNOSIS — R059 Cough, unspecified: Secondary | ICD-10-CM

## 2014-07-17 DIAGNOSIS — Z79899 Other long term (current) drug therapy: Secondary | ICD-10-CM | POA: Diagnosis not present

## 2014-07-17 DIAGNOSIS — R079 Chest pain, unspecified: Secondary | ICD-10-CM | POA: Diagnosis not present

## 2014-07-17 DIAGNOSIS — R05 Cough: Secondary | ICD-10-CM | POA: Diagnosis not present

## 2014-07-17 DIAGNOSIS — R599 Enlarged lymph nodes, unspecified: Secondary | ICD-10-CM | POA: Diagnosis not present

## 2014-07-17 DIAGNOSIS — Z862 Personal history of diseases of the blood and blood-forming organs and certain disorders involving the immune mechanism: Secondary | ICD-10-CM | POA: Diagnosis not present

## 2014-07-17 LAB — COMPREHENSIVE METABOLIC PANEL
ALBUMIN: 3.1 g/dL — AB (ref 3.5–5.2)
ALK PHOS: 97 U/L (ref 39–117)
ALT: 17 U/L (ref 0–53)
AST: 52 U/L — ABNORMAL HIGH (ref 0–37)
Anion gap: 8 (ref 5–15)
BUN: 9 mg/dL (ref 6–23)
CALCIUM: 8.6 mg/dL (ref 8.4–10.5)
CO2: 25 mmol/L (ref 19–32)
Chloride: 104 mmol/L (ref 96–112)
Creatinine, Ser: 1.21 mg/dL (ref 0.50–1.35)
GFR calc Af Amer: 85 mL/min — ABNORMAL LOW (ref >90)
GFR calc non Af Amer: 73 mL/min — ABNORMAL LOW (ref >90)
Glucose, Bld: 98 mg/dL (ref 70–99)
POTASSIUM: 3.9 mmol/L (ref 3.5–5.1)
SODIUM: 137 mmol/L (ref 135–145)
TOTAL PROTEIN: 7.7 g/dL (ref 6.0–8.3)
Total Bilirubin: 0.6 mg/dL (ref 0.3–1.2)

## 2014-07-17 LAB — CBC WITH DIFFERENTIAL/PLATELET
BASOS ABS: 0 10*3/uL (ref 0.0–0.1)
BASOS PCT: 0 % (ref 0–1)
Eosinophils Absolute: 0.2 10*3/uL (ref 0.0–0.7)
Eosinophils Relative: 3 % (ref 0–5)
HCT: 39.2 % (ref 39.0–52.0)
Hemoglobin: 12.9 g/dL — ABNORMAL LOW (ref 13.0–17.0)
LYMPHS ABS: 1.9 10*3/uL (ref 0.7–4.0)
Lymphocytes Relative: 33 % (ref 12–46)
MCH: 29.5 pg (ref 26.0–34.0)
MCHC: 32.9 g/dL (ref 30.0–36.0)
MCV: 89.5 fL (ref 78.0–100.0)
Monocytes Absolute: 0.8 10*3/uL (ref 0.1–1.0)
Monocytes Relative: 15 % — ABNORMAL HIGH (ref 3–12)
NEUTROS PCT: 49 % (ref 43–77)
Neutro Abs: 2.9 10*3/uL (ref 1.7–7.7)
Platelets: 233 10*3/uL (ref 150–400)
RBC: 4.38 MIL/uL (ref 4.22–5.81)
RDW: 14.3 % (ref 11.5–15.5)
WBC: 5.8 10*3/uL (ref 4.0–10.5)

## 2014-07-17 LAB — BRAIN NATRIURETIC PEPTIDE: B Natriuretic Peptide: 18.5 pg/mL (ref 0.0–100.0)

## 2014-07-17 MED ORDER — PANTOPRAZOLE SODIUM 40 MG PO TBEC
40.0000 mg | DELAYED_RELEASE_TABLET | Freq: Once | ORAL | Status: AC
  Administered 2014-07-17: 40 mg via ORAL
  Filled 2014-07-17: qty 1

## 2014-07-17 MED ORDER — ALBUTEROL SULFATE (2.5 MG/3ML) 0.083% IN NEBU
5.0000 mg | INHALATION_SOLUTION | Freq: Once | RESPIRATORY_TRACT | Status: AC
  Administered 2014-07-17: 5 mg via RESPIRATORY_TRACT
  Filled 2014-07-17: qty 6

## 2014-07-17 MED ORDER — PREDNISONE 20 MG PO TABS: ORAL_TABLET | ORAL | Status: DC

## 2014-07-17 MED ORDER — PANTOPRAZOLE SODIUM 20 MG PO TBEC
20.0000 mg | DELAYED_RELEASE_TABLET | Freq: Every day | ORAL | Status: DC
Start: 2014-07-17 — End: 2014-08-15

## 2014-07-17 MED ORDER — IPRATROPIUM-ALBUTEROL 0.5-2.5 (3) MG/3ML IN SOLN
3.0000 mL | Freq: Once | RESPIRATORY_TRACT | Status: AC
  Administered 2014-07-17: 3 mL via RESPIRATORY_TRACT
  Filled 2014-07-17: qty 3

## 2014-07-17 MED ORDER — METHYLPREDNISOLONE SODIUM SUCC 125 MG IJ SOLR
125.0000 mg | Freq: Once | INTRAMUSCULAR | Status: AC
  Administered 2014-07-17: 125 mg via INTRAVENOUS
  Filled 2014-07-17: qty 2

## 2014-07-17 NOTE — Telephone Encounter (Signed)
Patient needs to make an appointment. Thanks!

## 2014-07-17 NOTE — ED Provider Notes (Signed)
CSN: [id number]     Arrival date & time 07/17/14  2992 History   First MD Initiated Contact with Patient 07/17/14 [id number]     Chief Complaint  Patient presents with  . Cough  . Shortness of Breath     (Consider location/radiation/quality/duration/timing/severity/associated sxs/prior Treatment) Patient is a 39 y.o. male presenting with cough and shortness of breath. The history is provided by the patient.  Cough Cough characteristics:  Productive Sputum characteristics:  Nondescript Severity:  Mild Onset quality:  Gradual Timing:  Constant Progression:  Worsening Chronicity:  Chronic Smoker: no   Context: upper respiratory infection   Relieved by:  Nothing Worsened by:  Nothing tried Ineffective treatments:  None tried Associated symptoms: shortness of breath and wheezing   Associated symptoms: no chest pain, no fever, no headaches and no rhinorrhea   Shortness of Breath Associated symptoms: cough and wheezing   Associated symptoms: no abdominal pain, no chest pain, no fever, no headaches, no neck pain and no vomiting     Past Medical History  Diagnosis Date  . HIV disease   . Immune deficiency disorder   . Depression   . Allergy    Past Surgical History  Procedure Laterality Date  . Silicone injections Bilateral y-17    Breasts and hips- black market   Family History  Problem Relation Age of Onset  . Diabetes Maternal Grandmother   . Hypertension Maternal Grandmother    History  Substance Use Topics  . Smoking status: Never Smoker   . Smokeless tobacco: Never Used  . Alcohol Use: No    Review of Systems  Constitutional: Negative for fever.  HENT: Negative for drooling and rhinorrhea.   Eyes: Negative for pain.  Respiratory: Positive for cough, shortness of breath and wheezing.   Cardiovascular: Negative for chest pain and leg swelling.  Gastrointestinal: Negative for nausea, vomiting, abdominal pain and diarrhea.  Genitourinary: Negative for dysuria and  hematuria.  Musculoskeletal: Negative for gait problem and neck pain.  Skin: Negative for color change.  Neurological: Negative for numbness and headaches.  Hematological: Negative for adenopathy.  Psychiatric/Behavioral: Negative for behavioral problems.  All other systems reviewed and are negative.     Allergies  Review of patient's allergies indicates no known allergies.  Home Medications   Prior to Admission medications   Medication Sig Start Date End Date Taking? Authorizing Provider  albuterol (PROVENTIL HFA;VENTOLIN HFA) 108 (90 BASE) MCG/ACT inhaler Inhale 2 puffs into the lungs every 6 (six) hours as needed for wheezing or shortness of breath. 01/31/14  Yes Leana Gamer, MD  STRIBILD 150-150-200-300 MG TABS tablet Take 1 tablet by mouth daily.  07/07/14  Yes Historical Provider, MD  Umeclidinium-Vilanterol 62.5-25 MCG/INH AEPB 1 puff, once daily 07/15/14  Yes Deneise Lever, MD  albuterol (PROVENTIL) (2.5 MG/3ML) 0.083% nebulizer solution Take 3 mLs (2.5 mg total) by nebulization every 6 (six) hours as needed for wheezing or shortness of breath. Patient not taking: Reported on 07/17/2014 07/15/14   Deneise Lever, MD  albuterol (PROVENTIL) (2.5 MG/3ML) 0.083% nebulizer solution Take 3 mLs (2.5 mg total) by nebulization every 4 (four) hours as needed for wheezing or shortness of breath. Patient not taking: Reported on 07/17/2014 07/15/14   Deneise Lever, MD  benzonatate (TESSALON) 200 MG capsule Take 1 capsule (200 mg total) by mouth 3 (three) times daily as needed for cough. Patient not taking: Reported on 07/17/2014 07/15/14   Deneise Lever, MD  Nebulizers (COMPRESSOR NEBULIZER) MISC 1  Device by Does not apply route every 6 (six) hours as needed. 07/15/14   Deneise Lever, MD   BP 141/97 mmHg  Pulse 93  Temp(Src) 98.4 F (36.9 C) (Oral)  Resp 23  SpO2 98% Physical Exam  Constitutional: He is oriented to person, place, and time. He appears well-developed and  well-nourished.  HENT:  Head: Normocephalic and atraumatic.  Right Ear: External ear normal.  Left Ear: External ear normal.  Nose: Nose normal.  Mouth/Throat: Oropharynx is clear and moist. No oropharyngeal exudate.  Eyes: Conjunctivae and EOM are normal. Pupils are equal, round, and reactive to light.  Neck: Normal range of motion. Neck supple.  Cardiovascular: Normal rate, regular rhythm, normal heart sounds and intact distal pulses.  Exam reveals no gallop and no friction rub.   No murmur heard. Pulmonary/Chest: He has wheezes.  Mild tachypnea. Prolonged expiratory phase bilaterally with mild expert or wheezing. Mildly rhonchorous bilaterally.  Abdominal: Soft. Bowel sounds are normal. He exhibits no distension. There is no tenderness. There is no rebound and no guarding.  Musculoskeletal: Normal range of motion. He exhibits no edema or tenderness.  Neurological: He is alert and oriented to person, place, and time.  Skin: Skin is warm and dry.  Psychiatric: He has a normal mood and affect. His behavior is normal.  Nursing note and vitals reviewed.   ED Course  Procedures (including critical care time) Labs Review Labs Reviewed  CBC WITH DIFFERENTIAL/PLATELET - Abnormal; Notable for the following:    Hemoglobin 12.9 (*)    Monocytes Relative 15 (*)    All other components within normal limits  COMPREHENSIVE METABOLIC PANEL - Abnormal; Notable for the following:    Albumin 3.1 (*)    AST 52 (*)    GFR calc non Af Amer 73 (*)    GFR calc Af Amer 85 (*)    All other components within normal limits  BRAIN NATRIURETIC PEPTIDE  I-STAT TROPOININ, ED    Imaging Review Dg Chest 2 View  07/15/2014   CLINICAL DATA:  Productive cough.  Shortness of breath .  EXAM: CHEST  2 VIEW  COMPARISON:  05/25/2014.  FINDINGS: Mediastinum is stable. Bilateral hilar fullness is again noted consistent with adenopathy. Cardiomegaly with normal pulmonary vascularity. Bibasilar infiltrates are noted.  No pleural effusion or pneumothorax.  IMPRESSION: 1. Findings consistent with persistent bilateral hilar adenopathy. 2. Bibasilar pulmonary infiltrates. Infiltrates have increased prior study.   Electronically Signed   By: Marcello Moores  Register   On: 07/15/2014 12:07     EKG Interpretation   Date/Time:  Wednesday July 17 2014 08:40:51 EDT Ventricular Rate:  103 PR Interval:  137 QRS Duration: 85 QT Interval:  346 QTC Calculation: 453 R Axis:   35 Text Interpretation:  Sinus tachycardia Borderline T abnormalities,  anterior leads Baseline wander in lead(s) III V1 Confirmed by Katarina Riebe   MD, Maliek Schellhorn ([id number]) on 07/17/2014 8:48:26 AM      MDM   Final diagnoses:  SOB (shortness of breath)  Cough    9:30 AM 39 y.o. male w hx of HIV who presents with chronic cough and shortness of breath which has worsened over the last several days. She denies any fevers. Occasional nonspecific production of sputum. She has had a chest x-ray within the last 2 days which was reviewed by me. She saw her pulmonologist yesterday who suspects bronchitis related to chronic aspiration. She is afebrile and vital signs are unremarkable here. She has a prolonged expert phase with mild  wheezing bilaterally. We'll get screening lab work, repeat chest x-ray, and breathing treatment. Will also give a dose of Solu-Medrol as she states that she has improved in the past after steroids.  12:02 PM: I interpreted/reviewed the labs and/or imaging which were non-contributory. The patient is feeling much better after 2 breathing treatments. I did give her steroids and will give her some prednisone for a few days. She was supposed to get a prescription for reflux medication from her pulmonologist, I will fill this for her. Breath sounds have improved after breathing treatment.  I have discussed the diagnosis/risks/treatment options with the patient and believe the pt to be eligible for discharge home to follow-up with her pcp. We also  discussed returning to the ED immediately if new or worsening sx occur. We discussed the sx which are most concerning (e.g., worsening sob, cp, fever) that necessitate immediate return. Medications administered to the patient during their visit and any new prescriptions provided to the patient are listed below.  Medications given during this visit Medications  ipratropium-albuterol (DUONEB) 0.5-2.5 (3) MG/3ML nebulizer solution 3 mL (3 mLs Nebulization Given 07/17/14 0918)  albuterol (PROVENTIL) (2.5 MG/3ML) 0.083% nebulizer solution 5 mg (5 mg Nebulization Given 07/17/14 0927)  methylPREDNISolone sodium succinate (SOLU-MEDROL) 125 mg/2 mL injection 125 mg (125 mg Intravenous Given 07/17/14 0917)  albuterol (PROVENTIL) (2.5 MG/3ML) 0.083% nebulizer solution 5 mg (5 mg Nebulization Given 07/17/14 1119)  pantoprazole (PROTONIX) EC tablet 40 mg (40 mg Oral Given 07/17/14 1119)    New Prescriptions   PANTOPRAZOLE (PROTONIX) 20 MG TABLET    Take 1 tablet (20 mg total) by mouth daily.   PREDNISONE (DELTASONE) 20 MG TABLET    Take 2 tablets by mouth on day 1 and 2. Take 1 tablet by mouth on day 3.     Pamella Pert, MD 07/17/14 1202

## 2014-07-17 NOTE — Telephone Encounter (Signed)
Pt. Wants to pick-up a neb machine herself.  Pt is also wanting to know if she can get a RX for [employer]

## 2014-07-17 NOTE — Telephone Encounter (Signed)
Patient needs RX for cough, Tessalon not covered by insurance.

## 2014-07-17 NOTE — Telephone Encounter (Signed)
Pt in ER- Spoke with Robert Dawson/ER Case Manager States that she is requesting that something be called into pharmacy for GERD I explained to [name] that per last notes from [place], he has recommended that she follow PCP for treatment of this.  Robert Dawson expressed understanding. Nothing further needed. (see 07/16/14 telephone notes)

## 2014-07-17 NOTE — Telephone Encounter (Signed)
Spoke with Kim/ER Case Manager Made aware that we have spoken with that patient regarding this and have already given rec's.  Per Phone note 07/16/14 we were made aware of Tessalon not being covered by insurance and she was given rec's for Delsym, pt refused stating that she cannot afford this. (there are also notes with PCP office in chart about this as well.)  Pt then asking that something be called into pharmacy for GERD I explained to [name] that per last notes from [place], he has recommended that she follow PCP for treatment of this.  Kim expressed understanding. Nothing further needed. (see 07/16/14 telephone notes)  Glean Hess, CMA at 07/16/2014 5:07 PM     Status: Signed       Expand All Collapse All   Patient advised. Sent letter to Dr. Zigmund Daniel per patient's request so Dr. Zigmund Daniel can provide patient with medication for reflux. At this time there is nothing more that can be done, awaiting nebulizer to be sent to patient. [facility], please advise patient when Nebulizer will be sent. Thanks. Nothing further needed.             Deneise Lever, MD at 07/16/2014 4:45 PM     Status: Signed       Expand All Collapse All   No CPAP. No dx OSA. We ordered a nebulizer machine with albuterol. If this was set up through a DME, she will have to get the machine and med from them. She needs to get Delsym, or whatever otc cough med her insurance will help with, otc when she can. We can't maintain her with samples. Her basic problem is recurrent reflux and aspiration. She needs to work with her primary care person to manage that.        [name] states that she will contact PCP.

## 2014-07-17 NOTE — Progress Notes (Addendum)
39 yr old medicare/medicaid of [place] pt Has had respiratory difficulty for past year. Since yesterday, cough with shortness of breath/chest pain. Went to MD yesterday. Breathing has gotten worse.  Cm consulted to assist with medications.   Cm spoke with pt to assess she is having issues getting the medications prescribed by Dr Annamaria Boots recently.  Meds include a tessalon perrles and a GERD medication called in by Dr Annamaria Boots office. Cm educated pt on medicaid approved list of medications, how to get pharmacy to recommend another approved medicaid med, how to use discussed allergies, discussed GERD and answered various questions for pt 1053 CM and pt spoke with Corina/Stephanie at [facility] to confirm no GER med called in and there is not a comparable medication for tessalon perrles covered by medicaid and an OTC medication would not be effective.  Pharmacist recommends MD order medication to address the symptom (reflux) causing the coughing.   1101 Cm and pt called pcp office to Dr young office to discuss pharmacist recommendations and to discuss GERD med. While Cm talking to Dr Annamaria Boots office and requesting a return call back., Dr Aline Brochure re-examed opt and will give further breathing tx  1153 CM and pt received a return call & spoke with Ashtyn at Dr Annamaria Boots office [phone].  Ashtyn explained that pt was informed that pcp would manage the GERD/reflux and that OTC cough medicine offered if not a medicaid approved cough medication. Pt voiced understanding Pt provided with sandwich, drink (had not eaten this am) CM updated EDP, Aline Brochure who will initiate GERD med for pt and wants pcp to f/u. Pt has 07/25/14 1030 appt with pcp matthew and can have follow up & refills ordered at that time.  Pt voices understanding.  Entered pt pcp appt and info to get to complete medicaid formulary for 2016.  Pt given a 12 page copy of meds pertinent for her from 2016 medicaid formulary.  CM went over how to use formulary  and strongly suggested she take it with her to doctor appointments. Explained to her that non- preferred medicine on the formulary are medicines she will pay for out of pocket. Pt voiced understanding.  Pt assisted to restroom.

## 2014-07-17 NOTE — ED Notes (Signed)
Pt states she has had respiratory difficulty for past year.  Since yesterday, cough with shortness of breath/chest pain.  Went to MD yesterday.  Breathing has gotten worse. Denies fever.

## 2014-07-18 ENCOUNTER — Telehealth: Payer: Self-pay | Admitting: Internal Medicine

## 2014-07-18 LAB — I-STAT TROPONIN, ED: TROPONIN I, POC: 0 ng/mL (ref 0.00–0.08)

## 2014-07-18 LAB — ANGIOTENSIN CONVERTING ENZYME

## 2014-07-18 NOTE — Telephone Encounter (Signed)
Spoke with Forde Radon at [facility] and they have the order and the Rx, however, they need a different diagnosis for this. Kim stated that nurse was to check with Dr. Annamaria Boots and get back with her. [name]

## 2014-07-18 NOTE — Telephone Encounter (Signed)
I don't understand why J44.0 chronic bronchitis with acute bronchitis won't work for nebulizer.

## 2014-07-18 NOTE — Telephone Encounter (Signed)
Please re-submit with diagnosis of J44.0 Chronic Bronchitis with acute bronchitis.  Thanks.

## 2014-07-18 NOTE — Telephone Encounter (Signed)
Kim from [facility] advised that the nurse was to get with Dr. Annamaria Boots to get a new diagnosis for the Nebulizer.  Please advise what diagnosis you would like to use to obtain Neb.

## 2014-07-18 NOTE — Telephone Encounter (Signed)
Called [facility] and they said they do not have any orders for this patient.  Robert Dawson - can you check up on this?

## 2014-07-19 NOTE — Telephone Encounter (Signed)
Called and spoke with Forde Radon with [facility] and advised her to try to resubmit with Dx Code J44.0 Chronic Bronchitis. [name] stated that she would resubmit under this code and let me know. [name]

## 2014-07-22 ENCOUNTER — Telehealth: Payer: Self-pay | Admitting: Internal Medicine

## 2014-07-22 ENCOUNTER — Encounter: Payer: Self-pay | Admitting: Internal Medicine

## 2014-07-22 ENCOUNTER — Ambulatory Visit: Payer: Medicare Other | Admitting: Internal Medicine

## 2014-07-22 ENCOUNTER — Ambulatory Visit (INDEPENDENT_AMBULATORY_CARE_PROVIDER_SITE_OTHER): Payer: Medicare Other | Admitting: Internal Medicine

## 2014-07-22 VITALS — BP 122/84 | HR 98 | Ht 69.0 in | Wt 261.0 lb

## 2014-07-22 DIAGNOSIS — J440 Chronic obstructive pulmonary disease with acute lower respiratory infection: Secondary | ICD-10-CM

## 2014-07-22 DIAGNOSIS — R599 Enlarged lymph nodes, unspecified: Secondary | ICD-10-CM | POA: Diagnosis not present

## 2014-07-22 DIAGNOSIS — R590 Localized enlarged lymph nodes: Secondary | ICD-10-CM

## 2014-07-22 MED ORDER — FLUTICASONE-SALMETEROL 250-50 MCG/DOSE IN AEPB: 1.0000 | INHALATION_SPRAY | Freq: Two times a day (BID) | RESPIRATORY_TRACT | Status: DC

## 2014-07-22 NOTE — Patient Instructions (Addendum)
Your lab test and xray suggest you may have a condition called Sarcoid, which is part of the wheeze and cough. Treating sarcoid could be complicated because of your other health problems, so it will be important that we know for sure. I am going to ask one of my partners about doing the bronchoscopy procedure to get a little bit of lung tissue to test for sarcoid.   Use up the Anoro inhaler you are using now  Then start sample Advair 250   1 puff then rinse mouth, twice daily  We will keep the June appointment with me

## 2014-07-22 NOTE — Telephone Encounter (Signed)
Spoke with Robert Dawson this morning around 9:00 and stated that medication Rx was faxed Friday 07/19/14 after receiving new diagnosis. Nebulizer should arrive today and Medication should be delivered to her home by UPS/Fed EX on Tues (07/20/14). [name]

## 2014-07-22 NOTE — Telephone Encounter (Signed)
Pt is aware that this has been taken care of. Nothing further was needed.

## 2014-07-22 NOTE — Progress Notes (Signed)
07/15/14- 39 yo M/ F transgender never smoker w/ hx syphilis, HIV  referred by Dr. Zigmund Daniel; Wheezing; Choking sensation; hard to breathe; Syncope episodes after coughing so hard it caused her to faint; unable to eat, will throw up her food, has lost 24lbs due to inability to eat; History of asthma since childhood.  PFT done 10/25/2013- severe obstructive airways disease- with insignificant response to bronchodilator, possible restriction, normal diffusion FVC 3.13/73%, FEV1 1.86/53%, FEV1/FVC 0.59, FEF 25-75 percent 0.95, TLC 83%, DLCO 89%. She is aware of active reflux with easy shortness of breath especially while coughing. Scant white sputum. Little heartburn. Some wheeze but albuterol inhaler is not much help. Wakes at night coughing and choking short of breath if she lies on left side. CT chest 02/07/14-  IMPRESSION: No evidence of pulmonary embolism. Multifocal patchy/ nodular opacities, left lower lobe predominant, suspicious for multifocal pneumonia. Mediastinal/bilateral hilar lymphadenopathy, likely reactive. Electronically Signed  By: Julian Hy M.D.  On: 02/07/2014 11: CXR 05/25/14 IMPRESSION: Bilateral lymphadenopathy in the hila as well as left basilar patchy infiltrate. This raises suspicion for possible sarcoidosis given its relative chronicity. Alternatively this may represent recurrent multifocal pneumonia. Electronically Signed  By: Inez Catalina M.D.  On: 05/25/2014 10:40  07/22/14- 39 yo M/ F transgender never smoker w/ hx syphilis, HIV followed for interstitial infiltrates, cough, GERD, Sarcoid Follows for: hasn't heard from [facility]: lab results; SOB at all times; prod cough at times w/clear mucus; CP with lots of coughing; ACE 06/2014- >100     BNP 18.5     CMET creat 1.21  Hgb 12.9  Troponin 0 Cough is about the same, productive of white sputum. Denies night sweats or adenopathy. Some minor arthralgias in the knees. DME company is coming today to her home  bringing nebulizer machine. Anoro didn't help much.  ROS-see HPI   Negative unless "+" Constitutional:    +weight loss, night sweats, fevers, chills, fatigue, lassitude. HEENT:    headaches, difficulty swallowing, tooth/dental problems, sore throat,       sneezing, itching, ear ache, nasal congestion, post nasal drip, snoring CV:    chest pain, orthopnea, PND, swelling in lower extremities, anasarca,                                  dizziness, palpitations Resp:   +shortness of breath with exertion or at rest.                +productive cough,   non-productive cough, coughing up of blood.              change in color of mucus.  wheezing.   Skin:    rash or lesions. GI: + heartburn, indigestion, abdominal pain, nausea, vomiting, GU: d MS:   joint pain, stiffness,  Neuro-     nothing unusual Psych:  change in mood or affect.  depression or anxiety.   memory loss.  OBJ- Physical Exam  +presents as male General- Alert, Oriented, Affect-appropriate, Distress- none acute Skin- rash-none, lesions- none, excoriation- none, + tattoos Lymphadenopathy- none Head- atraumatic            Eyes- Gross vision intact, PERRLA, conjunctivae and secretions clear            Ears- Hearing, canals-normal            Nose- Clear, no-Septal dev, mucus, polyps, erosion, perforation  Throat- Mallampati II , mucosa clear , drainage- none, tonsils- atrophic Neck- flexible , trachea midline, no stridor , thyroid nl, carotid no bruit Chest - symmetrical excursion , unlabored           Heart/CV- RRR , no murmur , no gallop  , no rub, nl s1 s2                           - JVD- none , edema- none, stasis changes- none, varices- none           Lung- + coarse wheezes/rhonchi, cough-none , dullness-none, rub- none           Chest wall- + implants Abd-  Br/ Gen/ Rectal- Not done, not indicated Extrem- cyanosis- none, clubbing, none, atrophy- none, strength- nl Neuro- grossly intact to observation

## 2014-07-22 NOTE — Telephone Encounter (Signed)
Pt is aware that her medication will arrive tomorrow by UPS per Rhonda's documentation on the order. Nothing further was needed.

## 2014-07-23 NOTE — Assessment & Plan Note (Signed)
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 Her cough is a significant issue. Even if it is partly due to reflux and recurrent aspiration, a sarcoid component should be sought. Because of her history of STDs, sustained treatment with cortisone would carry risk of extended infection. I reviewed this with her today. I suggested that I speak with my associates about the possibility of bronchoscopy for transbronchial biopsy and cultures. She agrees to consider this approach.

## 2014-07-23 NOTE — Assessment & Plan Note (Signed)
I think chest a little better controlled at this visit. Plan-try Advair discus with discussion

## 2014-07-25 ENCOUNTER — Telehealth: Payer: Self-pay | Admitting: Pulmonary Disease

## 2014-07-25 ENCOUNTER — Ambulatory Visit: Payer: Medicare Other | Admitting: Internal Medicine

## 2014-07-25 DIAGNOSIS — R918 Other nonspecific abnormal finding of lung field: Secondary | ICD-10-CM | POA: Insufficient documentation

## 2014-07-25 NOTE — Telephone Encounter (Signed)
Called and bronch is scheduled for 5/10 at 7:30 for [facility]. Pt needs to arrive at 6:15. NPO after midnight.  I called made pt aware. She voiced her understanding and needed nothing further

## 2014-07-25 NOTE — Telephone Encounter (Signed)
I have reviewed case with Dr. Annamaria Boots, and all of the pt's xrays.  I agree she needs FOB with BAL and Bx.  I have spoken with her about the procedure, as well as the risk of ptx and bleeding.  She agrees to proceed.   Mindy, pt needs bronch scheduled for week after next, either Tues/wed/thurs am at 7:30.  Needs fluoro, regular scope, HIV + patient. At [facility].  Thanks.

## 2014-08-06 ENCOUNTER — Ambulatory Visit (HOSPITAL_COMMUNITY)
Admission: RE | Admit: 2014-08-06 | Discharge: 2014-08-06 | Disposition: A | Discharge status: Home / Self Care | Payer: Medicare Other | Source: Ambulatory Visit | Attending: Pulmonary Disease | Admitting: Pulmonary Disease

## 2014-08-06 ENCOUNTER — Encounter (HOSPITAL_COMMUNITY): Payer: Self-pay | Admitting: Respiratory Therapy

## 2014-08-06 ENCOUNTER — Encounter (HOSPITAL_COMMUNITY)
Admission: RE | Disposition: A | Discharge status: Home / Self Care | Payer: Self-pay | Source: Ambulatory Visit | Attending: Pulmonary Disease

## 2014-08-06 ENCOUNTER — Ambulatory Visit (HOSPITAL_COMMUNITY): Payer: Medicare Other

## 2014-08-06 DIAGNOSIS — F329 Major depressive disorder, single episode, unspecified: Secondary | ICD-10-CM | POA: Diagnosis not present

## 2014-08-06 DIAGNOSIS — B20 Human immunodeficiency virus [HIV] disease: Secondary | ICD-10-CM | POA: Diagnosis not present

## 2014-08-06 DIAGNOSIS — J984 Other disorders of lung: Secondary | ICD-10-CM | POA: Insufficient documentation

## 2014-08-06 DIAGNOSIS — R918 Other nonspecific abnormal finding of lung field: Secondary | ICD-10-CM

## 2014-08-06 DIAGNOSIS — R590 Localized enlarged lymph nodes: Secondary | ICD-10-CM | POA: Insufficient documentation

## 2014-08-06 DIAGNOSIS — R911 Solitary pulmonary nodule: Secondary | ICD-10-CM

## 2014-08-06 DIAGNOSIS — J209 Acute bronchitis, unspecified: Secondary | ICD-10-CM | POA: Diagnosis not present

## 2014-08-06 DIAGNOSIS — J8410 Pulmonary fibrosis, unspecified: Secondary | ICD-10-CM | POA: Diagnosis not present

## 2014-08-06 HISTORY — PX: VIDEO BRONCHOSCOPY: SHX5072

## 2014-08-06 SURGERY — BRONCHOSCOPY, WITH FLUOROSCOPY
Anesthesia: Moderate Sedation | Laterality: Bilateral

## 2014-08-06 MED ORDER — SODIUM CHLORIDE 0.9 % IV SOLN: Freq: Once | INTRAVENOUS | Status: DC

## 2014-08-06 MED ORDER — LIDOCAINE HCL 2 % EX GEL
CUTANEOUS | Status: DC | PRN
  Administered 2014-08-06: 1

## 2014-08-06 MED ORDER — LIDOCAINE HCL 2 % EX GEL: 1.0000 "application " | Freq: Once | CUTANEOUS | Status: DC

## 2014-08-06 MED ORDER — PHENYLEPHRINE HCL 0.25 % NA SOLN
NASAL | Status: DC | PRN
  Administered 2014-08-06: 2 via NASAL

## 2014-08-06 MED ORDER — SODIUM CHLORIDE 0.9 % IV SOLN
INTRAVENOUS | Status: DC
  Administered 2014-08-06: 07:00:00 via INTRAVENOUS

## 2014-08-06 MED ORDER — PHENYLEPHRINE HCL 0.25 % NA SOLN
1.0000 | Freq: Four times a day (QID) | NASAL | Status: DC | PRN
Start: 2014-08-06 — End: 2014-08-08

## 2014-08-06 MED ORDER — BUTAMBEN-TETRACAINE-BENZOCAINE 2-2-14 % EX AERO: 1.0000 | INHALATION_SPRAY | Freq: Once | CUTANEOUS | Status: DC

## 2014-08-06 MED ORDER — ALBUTEROL SULFATE (2.5 MG/3ML) 0.083% IN NEBU
2.5000 mg | INHALATION_SOLUTION | Freq: Once | RESPIRATORY_TRACT | Status: AC
  Administered 2014-08-06: 2.5 mg via RESPIRATORY_TRACT

## 2014-08-06 MED ORDER — MEPERIDINE HCL 100 MG/ML IJ SOLN
INTRAMUSCULAR | Status: AC
  Filled 2014-08-06: qty 2

## 2014-08-06 MED ORDER — MIDAZOLAM HCL 10 MG/2ML IJ SOLN
INTRAMUSCULAR | Status: DC | PRN
  Administered 2014-08-06: 2.5 mg via INTRAVENOUS
  Administered 2014-08-06: 5 mg via INTRAVENOUS
  Administered 2014-08-06: 2.5 mg via INTRAVENOUS
  Administered 2014-08-06: 5 mg via INTRAVENOUS
  Administered 2014-08-06: 2.5 mg via INTRAVENOUS

## 2014-08-06 MED ORDER — MEPERIDINE HCL 25 MG/ML IJ SOLN
INTRAMUSCULAR | Status: DC | PRN
  Administered 2014-08-06: 50 mg via INTRAVENOUS

## 2014-08-06 MED ORDER — LIDOCAINE HCL 1 % IJ SOLN
INTRAMUSCULAR | Status: DC | PRN
  Administered 2014-08-06: 6 mL via RESPIRATORY_TRACT

## 2014-08-06 MED ORDER — MIDAZOLAM HCL 10 MG/2ML IJ SOLN
INTRAMUSCULAR | Status: AC
  Filled 2014-08-06: qty 4

## 2014-08-06 NOTE — Progress Notes (Signed)
Video Bronchoscopy done  Intervention Bronchial washing done Intervention Bronchial biopsy done 

## 2014-08-06 NOTE — Discharge Instructions (Signed)
Flexible Bronchoscopy, Care After These instructions give you information on caring for yourself after your procedure. Your doctor may also give you more specific instructions. Call your doctor if you have any problems or questions after your procedure. HOME CARE  Do not eat or drink anything for 2 hours after your procedure. If you try to eat or drink before the medicine wears off, food or drink could go into your lungs. You could also burn yourself.  After 2 hours have passed and when you can cough and gag normally, you may eat soft food and drink liquids slowly.  The day after the test, you may eat your normal diet.  You may do your normal activities.  Keep all doctor visits. GET HELP RIGHT AWAY IF:  You get more and more short of breath.  You get light-headed.  You feel like you are going to pass out (faint).  You have chest pain.  You have new problems that worry you.  You cough up more than a little blood.  You cough up more blood than before. MAKE SURE YOU:  Understand these instructions.  Will watch your condition.  Will get help right away if you are not doing well or get worse. Document Released: 01/10/2009 Document Revised: 03/20/2013 Document Reviewed: 11/17/2012 [facility] Patient Information 2015 [place], [place]. This information is not intended to replace advice given to you by your health care provider. Make sure you discuss any questions you have with your health care provider.   Nothing to eat or drink until   11:20  am     Today     08/06/2014.

## 2014-08-06 NOTE — H&P (View-Only) (Signed)
07/15/14- 39 yo M/ F transgender never smoker w/ hx syphilis, HIV  referred by Dr. Zigmund Daniel; Wheezing; Choking sensation; hard to breathe; Syncope episodes after coughing so hard it caused her to faint; unable to eat, will throw up her food, has lost 24lbs due to inability to eat; History of asthma since childhood.  PFT done 10/25/2013- severe obstructive airways disease- with insignificant response to bronchodilator, possible restriction, normal diffusion FVC 3.13/73%, FEV1 1.86/53%, FEV1/FVC 0.59, FEF 25-75 percent 0.95, TLC 83%, DLCO 89%. She is aware of active reflux with easy shortness of breath especially while coughing. Scant white sputum. Little heartburn. Some wheeze but albuterol inhaler is not much help. Wakes at night coughing and choking short of breath if she lies on left side. CT chest 02/07/14-  IMPRESSION: No evidence of pulmonary embolism. Multifocal patchy/ nodular opacities, left lower lobe predominant, suspicious for multifocal pneumonia. Mediastinal/bilateral hilar lymphadenopathy, likely reactive. Electronically Signed  By: Julian Hy M.D.  On: 02/07/2014 11: CXR 05/25/14 IMPRESSION: Bilateral lymphadenopathy in the hila as well as left basilar patchy infiltrate. This raises suspicion for possible sarcoidosis given its relative chronicity. Alternatively this may represent recurrent multifocal pneumonia. Electronically Signed  By: Inez Catalina M.D.  On: 05/25/2014 10:40  Prior to Admission medications   Medication Sig Start Date End Date Taking? Authorizing Provider  albuterol (PROVENTIL HFA;VENTOLIN HFA) 108 (90 BASE) MCG/ACT inhaler Inhale 2 puffs into the lungs every 6 (six) hours as needed for wheezing or shortness of breath. 01/31/14  Yes Leana Gamer, MD  STRIBILD 150-150-200-300 MG TABS tablet  07/07/14  Yes Historical Provider, MD  albuterol (PROVENTIL) (2.5 MG/3ML) 0.083% nebulizer solution Take 3 mLs (2.5 mg total) by nebulization every 6  (six) hours as needed for wheezing or shortness of breath. 07/15/14   Deneise Lever, MD  albuterol (PROVENTIL) (2.5 MG/3ML) 0.083% nebulizer solution Take 3 mLs (2.5 mg total) by nebulization every 4 (four) hours as needed for wheezing or shortness of breath. 07/15/14   Deneise Lever, MD  benzonatate (TESSALON) 200 MG capsule Take 1 capsule (200 mg total) by mouth 3 (three) times daily as needed for cough. 07/15/14   Deneise Lever, MD  Nebulizers (COMPRESSOR NEBULIZER) MISC 1 Device by Does not apply route every 6 (six) hours as needed. 07/15/14   Deneise Lever, MD  Umeclidinium-Vilanterol 62.5-25 MCG/INH AEPB 1 puff, once daily 07/15/14   Deneise Lever, MD   Past Medical History  Diagnosis Date  . HIV disease   . Immune deficiency disorder   . Depression   . Allergy    Past Surgical History  Procedure Laterality Date  . Silicone injections Bilateral y-18    Breasts and hips- black market   Family History  Problem Relation Age of Onset  . Diabetes Maternal Grandmother   . Hypertension Maternal Grandmother    History   Social History  . Marital Status: Single    Spouse Name: N/A  . Number of Children: N/A  . Years of Education: N/A   Occupational History  . Not on file.   Social History Main Topics  . Smoking status: Never Smoker   . Smokeless tobacco: Never Used  . Alcohol Use: No  . Drug Use: No  . Sexual Activity: Not Currently     Comment: given condoms. Pt is an under.   Other Topics Concern  . Not on file   Social History Narrative   ROS-see HPI   Negative unless "+" Constitutional:    +  weight loss, night sweats, fevers, chills, fatigue, lassitude. HEENT:    headaches, difficulty swallowing, tooth/dental problems, sore throat,       sneezing, itching, ear ache, nasal congestion, post nasal drip, snoring CV:    chest pain, orthopnea, PND, swelling in lower extremities, anasarca,                                  dizziness, palpitations Resp:   +shortness of  breath with exertion or at rest.                +productive cough,   non-productive cough, coughing up of blood.              change in color of mucus.  wheezing.   Skin:    rash or lesions. GI: + heartburn, indigestion, abdominal pain, nausea, vomiting, diarrhea,                 change in bowel habits, loss of appetite GU: dysuria, change in color of urine, no urgency or frequency.   flank pain. MS:   joint pain, stiffness, decreased range of motion, back pain. Neuro-     nothing unusual Psych:  change in mood or affect.  depression or anxiety.   memory loss.  OBJ- Physical Exam  +presents as male General- Alert, Oriented, Affect-appropriate, Distress- none acute Skin- rash-none, lesions- none, excoriation- none, + tattoos Lymphadenopathy- none Head- atraumatic            Eyes- Gross vision intact, PERRLA, conjunctivae and secretions clear            Ears- Hearing, canals-normal            Nose- Clear, no-Septal dev, mucus, polyps, erosion, perforation             Throat- Mallampati II , mucosa clear , drainage- none, tonsils- atrophic Neck- flexible , trachea midline, no stridor , thyroid nl, carotid no bruit Chest - symmetrical excursion , unlabored           Heart/CV- RRR , no murmur , no gallop  , no rub, nl s1 s2                           - JVD- none , edema- none, stasis changes- none, varices- none           Lung- + coarse wheezes/rhonchi, cough + raspy , dullness-none, rub- none           Chest wall- + implants Abd-  Br/ Gen/ Rectal- Not done, not indicated Extrem- cyanosis- none, clubbing, none, atrophy- none, strength- nl Neuro- grossly intact to observation

## 2014-08-06 NOTE — Op Note (Signed)
Dictation #:  [id number]

## 2014-08-06 NOTE — Interval H&P Note (Signed)
History and Physical Interval Note:  08/06/2014 7:43 AM  Robert Dawson  has presented today for surgery, with the diagnosis of Lung nodule  The various methods of treatment have been discussed with the patient and family. After consideration of risks, benefits and other options for treatment, the patient has consented to  Procedure(s): VIDEO BRONCHOSCOPY WITH FLUORO (Bilateral) as a surgical intervention .  The patient's history has been reviewed, patient examined, no change in status, stable for surgery.  I have reviewed the patient's chart and labs.  Questions were answered to the patient's satisfaction.     [name]

## 2014-08-07 ENCOUNTER — Encounter (HOSPITAL_COMMUNITY): Payer: Self-pay | Admitting: Pulmonary Disease

## 2014-08-07 LAB — PNEUMOCYSTIS JIROVECI SMEAR BY DFA: Pneumocystis jiroveci Ag: NEGATIVE

## 2014-08-07 NOTE — Op Note (Signed)
NAMEJOHANN, Robert            ACCOUNT NO.:  [id number]  MEDICAL RECORD NO.:  [id number]  LOCATION:  [facility]                         FACILITY[facility]  PHYSICIAN:  [name], MD,FCCPDATE OF BIRTH:  07-23-1974  DATE OF PROCEDURE:  08/06/2014 DATE OF DISCHARGE:                              OPERATIVE REPORT   PROCEDURE:  Flexible fiberoptic bronchoscopy with biopsy.  INDICATION:  Nodular infiltrates with mediastinal and hilar lymphadenopathy of unknown origin.  OPERATOR:  [name], MD, [facility]  ANESTHESIA:  Versed 17.5 mg IV in various aliquots, also Demerol 50 mg IV.  DESCRIPTION OF PROCEDURE:  After obtaining informed consent and under close cardiopulmonary monitoring, the above preop anesthesia was given and the fiberoptic scope was passed to the right naris and into posterior pharynx.  There were no lesions or other abnormalities seen. The vocal cords appeared to be within normal limits and moved bilaterally on phonation.  Scope was then passed into the trachea where it was examined along its entire length down to the level of the carina which was sharp and appeared normal.  The right tracheobronchial tree was examined with subsegmental level with significant inflammatory changes noted throughout.  There was cobblestoning in his right middle lobe bronchus with significant stenosis from the cobblestoning and also extrinsic compression from the lymphadenopathy.  All of the right lower lobe segmental bronchi were also edematous and inflamed with narrowing. The attention was then paid to the left tracheobronchial tree where there were again significant inflammatory changes and cobblestoning in the left lower lobe and also in the lingula.  There was significant narrowing of the orifice to the superior segment of the left lower lobe, as well as the basilar segments.  Bronchoalveolar lavage was done from the superior segment of the left lower lobe and sent for the  usual cytologic and bacteriologic evaluation.  The plan was for transbronchial lung biopsies, however, the patient had severe refractory cough despite increasing sedation and use of topical lidocaine.  It was deemed unsafe to do transbronchial lung biopsies because of the patient's violent coughing.  Endobronchial biopsies were then done of the orifice to the superior segment of the left lower lobe as well as the abnormalities in the lingular bronchus.  There were also endobronchial biopsies done of the right middle lobe abnormalities.  Because of the patient's violent coughing, she had issues with tachycardia and hypertension during this study, but this responded very well to sedation.  Overall, the patient tolerated the procedure well and there were no immediate complications noted.     [name], MD,FCCP     [facility]/MEDQ  D:  08/06/2014  T:  08/07/2014  Job:  [id number]

## 2014-08-09 ENCOUNTER — Telehealth: Payer: Self-pay | Admitting: Internal Medicine

## 2014-08-09 NOTE — Telephone Encounter (Signed)
Thanks, I agree

## 2014-08-09 NOTE — Telephone Encounter (Signed)
No problem thanks for working her up and taking care of her!

## 2014-08-09 NOTE — Telephone Encounter (Signed)
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 I explained that this would be a problem given her infection history and that I would want to discuss with Dr Matthews/ PCP and with Dr Campbell/ ID, for their input first.

## 2014-08-09 NOTE — Telephone Encounter (Signed)
[name] is out of town at present. The patient is currently by most recent data NOT with a  Well controlled HIV virus.  IF she could control her HIV virus by taking her ARV medications, THEN I  Personally would have no problem saying OK to steroids.  (but [name] is the primary ID MD and Dr. Zigmund Daniel PCP)  Robert Dawson IF she will not take her ARV then we are going to be dealing with inevitable decline in her CD4 count and progression to AIDS   So I think that  Insisting that she take her ARVs is good bargaining poitn to treat her sarcoid

## 2014-08-11 LAB — CULTURE, BAL-QUANTITATIVE W GRAM STAIN: Colony Count: 50000

## 2014-08-12 ENCOUNTER — Telehealth: Payer: Self-pay | Admitting: Internal Medicine

## 2014-08-12 ENCOUNTER — Emergency Department (HOSPITAL_COMMUNITY)
Admission: EM | Admit: 2014-08-12 | Discharge: 2014-08-12 | Disposition: A | Discharge status: Home / Self Care | Payer: Medicare Other | Attending: Emergency Medicine | Admitting: Emergency Medicine

## 2014-08-12 ENCOUNTER — Encounter (HOSPITAL_COMMUNITY): Payer: Self-pay | Admitting: Emergency Medicine

## 2014-08-12 ENCOUNTER — Emergency Department (HOSPITAL_COMMUNITY): Payer: Medicare Other

## 2014-08-12 DIAGNOSIS — Z7951 Long term (current) use of inhaled steroids: Secondary | ICD-10-CM | POA: Insufficient documentation

## 2014-08-12 DIAGNOSIS — Z862 Personal history of diseases of the blood and blood-forming organs and certain disorders involving the immune mechanism: Secondary | ICD-10-CM | POA: Diagnosis not present

## 2014-08-12 DIAGNOSIS — J189 Pneumonia, unspecified organism: Secondary | ICD-10-CM

## 2014-08-12 DIAGNOSIS — Z21 Asymptomatic human immunodeficiency virus [HIV] infection status: Secondary | ICD-10-CM | POA: Insufficient documentation

## 2014-08-12 DIAGNOSIS — R918 Other nonspecific abnormal finding of lung field: Secondary | ICD-10-CM | POA: Diagnosis not present

## 2014-08-12 DIAGNOSIS — Z79899 Other long term (current) drug therapy: Secondary | ICD-10-CM | POA: Insufficient documentation

## 2014-08-12 DIAGNOSIS — Z8659 Personal history of other mental and behavioral disorders: Secondary | ICD-10-CM | POA: Diagnosis not present

## 2014-08-12 DIAGNOSIS — J159 Unspecified bacterial pneumonia: Secondary | ICD-10-CM | POA: Diagnosis not present

## 2014-08-12 DIAGNOSIS — R0602 Shortness of breath: Secondary | ICD-10-CM | POA: Diagnosis not present

## 2014-08-12 MED ORDER — CEFTRIAXONE SODIUM 1 G IJ SOLR
1.0000 g | Freq: Once | INTRAMUSCULAR | Status: AC
  Administered 2014-08-12: 1 g via INTRAMUSCULAR
  Filled 2014-08-12: qty 10

## 2014-08-12 MED ORDER — DEXAMETHASONE SODIUM PHOSPHATE 4 MG/ML IJ SOLN
8.0000 mg | Freq: Once | INTRAMUSCULAR | Status: AC
  Administered 2014-08-12: 8 mg via INTRAMUSCULAR
  Filled 2014-08-12: qty 2

## 2014-08-12 MED ORDER — SULFAMETHOXAZOLE-TRIMETHOPRIM 800-160 MG PO TABS
1.0000 | ORAL_TABLET | Freq: Once | ORAL | Status: AC
  Administered 2014-08-12: 1 via ORAL
  Filled 2014-08-12: qty 1

## 2014-08-12 MED ORDER — AZITHROMYCIN 250 MG PO TABS
500.0000 mg | ORAL_TABLET | Freq: Once | ORAL | Status: AC
  Administered 2014-08-12: 500 mg via ORAL
  Filled 2014-08-12: qty 2

## 2014-08-12 MED ORDER — HYDROCODONE-ACETAMINOPHEN 5-325 MG PO TABS
1.0000 | ORAL_TABLET | ORAL | Status: DC | PRN
Start: 2014-08-12 — End: 2014-09-24

## 2014-08-12 MED ORDER — LIDOCAINE HCL 1 % IJ SOLN
INTRAMUSCULAR | Status: AC
  Filled 2014-08-12: qty 20

## 2014-08-12 MED ORDER — AZITHROMYCIN 250 MG PO TABS: ORAL_TABLET | ORAL | Status: DC

## 2014-08-12 MED ORDER — DEXTROSE 5 % IV SOLN: 1.0000 g | Freq: Once | INTRAVENOUS | Status: DC

## 2014-08-12 MED ORDER — ALBUTEROL SULFATE (2.5 MG/3ML) 0.083% IN NEBU
5.0000 mg | INHALATION_SOLUTION | Freq: Once | RESPIRATORY_TRACT | Status: AC
  Administered 2014-08-12: 5 mg via RESPIRATORY_TRACT
  Filled 2014-08-12: qty 6

## 2014-08-12 NOTE — Telephone Encounter (Signed)
Spoke with CY-he suggests getting patient in with him this week to talk. Pt will come in on Thursday 08-15-14 at 11:15am to see CY;pt aware of appt date and time. Nothing more needed at this time.

## 2014-08-12 NOTE — ED Notes (Signed)
Pt has been having difficulty breathing x several months. Pt states that he has been in here 3 times for the same thing. When asking pt what medical history does they have, pt states "i have been here three times, it's in my records.

## 2014-08-12 NOTE — Telephone Encounter (Signed)
Called and spoke to pt. Pt stated after bronch on 5/10 her cough has worsened. Pt c/o coughing till the point of emesis. Pt stated the cough is intermittently productive with clear mucus and has resulting chest soreness r/t cough. Pt denies f/c/s and hemoptysis. Pt requesting recs from CY.   CY please advise.   No Known Allergies  Current Outpatient Prescriptions on File Prior to Visit  Medication Sig Dispense Refill  . albuterol (PROVENTIL HFA;VENTOLIN HFA) 108 (90 BASE) MCG/ACT inhaler Inhale 2 puffs into the lungs every 6 (six) hours as needed for wheezing or shortness of breath. 1 Inhaler 6  . albuterol (PROVENTIL) (2.5 MG/3ML) 0.083% nebulizer solution Take 3 mLs (2.5 mg total) by nebulization every 6 (six) hours as needed for wheezing or shortness of breath. 75 mL 12  . albuterol (PROVENTIL) (2.5 MG/3ML) 0.083% nebulizer solution Take 3 mLs (2.5 mg total) by nebulization every 4 (four) hours as needed for wheezing or shortness of breath. (Patient not taking: Reported on 07/17/2014) 75 mL 12  . benzonatate (TESSALON) 200 MG capsule Take 1 capsule (200 mg total) by mouth 3 (three) times daily as needed for cough. (Patient not taking: Reported on 07/17/2014) 30 capsule 1  . Fluticasone-Salmeterol (ADVAIR DISKUS) 250-50 MCG/DOSE AEPB Inhale 1 puff into the lungs 2 (two) times daily. 1 each 0  . Nebulizers (COMPRESSOR NEBULIZER) MISC 1 Device by Does not apply route every 6 (six) hours as needed. 1 each 0  . pantoprazole (PROTONIX) 20 MG tablet Take 1 tablet (20 mg total) by mouth daily. 30 tablet 1  . STRIBILD 150-150-200-300 MG TABS tablet Take 1 tablet by mouth daily.   6  . Umeclidinium-Vilanterol 62.5-25 MCG/INH AEPB 1 puff, once daily 1 each prn   No current facility-administered medications on file prior to visit.

## 2014-08-12 NOTE — ED Provider Notes (Signed)
CSN: [id number]     Arrival date & time 08/12/14  1215 History   First MD Initiated Contact with Patient 08/12/14 1240     Chief Complaint  Patient presents with  . Shortness of Breath     (Consider location/radiation/quality/duration/timing/severity/associated sxs/prior Treatment) HPI Comments: Patient presents to the emergency department with complaint of cough and difficulty breathing. Patient states this problem started several months ago, but has been getting progressively worse. The patient is being seen by pulmonary, Dr. Annamaria Boots for this same problem. The patient recently had an bronchoscopy, but has not received the results of these tests. Patient states  has been in the emergency department 3 times for this same problem. Patient has a history of immune deficiency disorder and HIV disease.  The history is provided by the patient.    Past Medical History  Diagnosis Date  . HIV disease   . Immune deficiency disorder   . Depression   . Allergy    Past Surgical History  Procedure Laterality Date  . Silicone injections Bilateral y-18    Breasts and hips- black market  . Video bronchoscopy Bilateral 08/06/2014    Procedure: VIDEO BRONCHOSCOPY WITH FLUORO;  Surgeon: [name], MD;  Location: [facility];  Service: Endoscopy;  Laterality: Bilateral;   Family History  Problem Relation Age of Onset  . Diabetes Maternal Grandmother   . Hypertension Maternal Grandmother    History  Substance Use Topics  . Smoking status: Never Smoker   . Smokeless tobacco: Never Used  . Alcohol Use: No    Review of Systems  Constitutional: Positive for fatigue.  Respiratory: Positive for cough and shortness of breath.   All other systems reviewed and are negative.     Allergies  Review of patient's allergies indicates no known allergies.  Home Medications   Prior to Admission medications   Medication Sig Start Date End Date Taking? Authorizing Provider  albuterol (PROVENTIL  HFA;VENTOLIN HFA) 108 (90 BASE) MCG/ACT inhaler Inhale 2 puffs into the lungs every 6 (six) hours as needed for wheezing or shortness of breath. 01/31/14  Yes Leana Gamer, MD  albuterol (PROVENTIL) (2.5 MG/3ML) 0.083% nebulizer solution Take 3 mLs (2.5 mg total) by nebulization every 6 (six) hours as needed for wheezing or shortness of breath. 07/15/14  Yes Deneise Lever, MD  Fluticasone-Salmeterol (ADVAIR DISKUS) 250-50 MCG/DOSE AEPB Inhale 1 puff into the lungs 2 (two) times daily. 07/22/14  Yes Deneise Lever, MD  Nebulizers (COMPRESSOR NEBULIZER) MISC 1 Device by Does not apply route every 6 (six) hours as needed. 07/15/14  Yes Deneise Lever, MD  pantoprazole (PROTONIX) 20 MG tablet Take 1 tablet (20 mg total) by mouth daily. 07/17/14  Yes Pamella Pert, MD  STRIBILD 150-150-200-300 MG TABS tablet Take 1 tablet by mouth daily.  07/07/14  Yes Historical Provider, MD  albuterol (PROVENTIL) (2.5 MG/3ML) 0.083% nebulizer solution Take 3 mLs (2.5 mg total) by nebulization every 4 (four) hours as needed for wheezing or shortness of breath. Patient not taking: Reported on 07/17/2014 07/15/14   Deneise Lever, MD  azithromycin (ZITHROMAX) 250 MG tablet 1 PO DAILY START 5/17. 08/12/14   Lily Kocher, PA-C  benzonatate (TESSALON) 200 MG capsule Take 1 capsule (200 mg total) by mouth 3 (three) times daily as needed for cough. Patient not taking: Reported on 07/17/2014 07/15/14   Deneise Lever, MD  HYDROcodone-acetaminophen (NORCO) 5-325 MG per tablet Take 1 tablet by mouth every 4 (four) hours as needed  for moderate pain. 08/12/14   Lily Kocher, PA-C  Umeclidinium-Vilanterol 62.5-25 MCG/INH AEPB 1 puff, once daily Patient not taking: Reported on 08/12/2014 07/15/14   Deneise Lever, MD   BP 118/76 mmHg  Pulse 99  Temp(Src) 97.6 F (36.4 C) (Oral)  Resp 14  SpO2 90% Physical Exam  Constitutional: He is oriented to person, place, and time. He appears well-developed and well-nourished.   Non-toxic appearance.  HENT:  Head: Normocephalic.  Right Ear: Tympanic membrane and external ear normal.  Left Ear: Tympanic membrane and external ear normal.  Eyes: EOM and lids are normal. Pupils are equal, round, and reactive to light.  Neck: Normal range of motion. Neck supple. Carotid bruit is not present.  Cardiovascular: Normal rate, regular rhythm, normal heart sounds, intact distal pulses and normal pulses.   Pulmonary/Chest: Breath sounds normal. No respiratory distress.  Bilateral rales and rhonchi appreciated. Patient speaks in complete sentences without problem. There is symmetrical rise and fall of the chest.  Abdominal: Soft. Bowel sounds are normal. There is no tenderness. There is no guarding.  Musculoskeletal: Normal range of motion.  Lymphadenopathy:       Head (right side): No submandibular adenopathy present.       Head (left side): No submandibular adenopathy present.    He has no cervical adenopathy.  Neurological: He is alert and oriented to person, place, and time. He has normal strength. No cranial nerve deficit or sensory deficit.  Skin: Skin is warm and dry.  Psychiatric: He has a normal mood and affect. His speech is normal.  Nursing note and vitals reviewed.   ED Course  Procedures (including critical care time) Labs Review Labs Reviewed - No data to display  Imaging Review Dg Chest 2 View  08/12/2014   CLINICAL DATA:  Difficulty breathing for several months  EXAM: CHEST  2 VIEW  COMPARISON:  07/17/2014  FINDINGS: Cardiac shadow is within normal limits. The lungs are well aerated bilaterally. Some patchy increased density is noted in the right upper lobe. The previously seen patchy changes in the left lung have resolved in the interval. No effusion or pneumothorax is noted. No bony abnormality is seen.  IMPRESSION: Patchy right upper lobe infiltrate. This is new from the prior exam with resolution of previously seen left-sided infiltrate. This likely  represents some waxing and waning infiltrates.   Electronically Signed   By: Inez Catalina M.D.   On: 08/12/2014 15:00     EKG Interpretation None      MDM  Patient received a nebulizer treatment with some improvement in the breathing. Chest x-ray reveals a new patchy right upper lobe infiltrate. There is some resolution of the previous left-sided infiltrate. I have discussed this with the patient. Also discussed this with critical care physician. The patient is treated in the emergency department with Rocephin and Zithromax. The patient is given a prescription for Zithromax to be taken daily. Patient will continue albuterol nebulizer treatments every 4 hours, and the patient is to be seen by Dr. Annamaria Boots, or member of his team in 3 days for recheck and follow-up.    Final diagnoses:  CAP (community acquired pneumonia)    *I have reviewed nursing notes, vital signs, and all appropriate lab and imaging results for this patient.**  [address], [place] 08/12/14 1627  Ernestina Patches, MD 08/13/14 [phone]

## 2014-08-12 NOTE — Telephone Encounter (Signed)
Lorane Gell, CMA at 08/12/2014 2:39 PM     Status: Signed       Expand All Collapse All   Spoke with CY-he suggests getting patient in with him this week to talk. Pt will come in on Thursday 08-15-14 at 11:15am to see CY;pt aware of appt date and time. Nothing more needed at this time.

## 2014-08-12 NOTE — Discharge Instructions (Signed)
Your x-ray reveals a new right lung pneumonia. Please use Zithromax 1 tablet daily with food until all taken. Start this medication on tomorrow, May 17. Please continue your albuterol nebulizer, or your albuterol inhaler every 4 hours. Please see Dr. Annamaria Boots, or member of his team in 3 days. Pneumonia, Adult Pneumonia is an infection of the lungs. It may be caused by a germ (virus or bacteria). Some types of pneumonia can spread easily from person to person. This can happen when you cough or sneeze. HOME CARE  Only take medicine as told by your doctor.  Take your medicine (antibiotics) as told. Finish it even if you start to feel better.  Do not smoke.  You may use a vaporizer or humidifier in your room. This can help loosen thick spit (mucus).  Sleep so you are almost sitting up (semi-upright). This helps reduce coughing.  Rest. A shot (vaccine) can help prevent pneumonia. Shots are often advised for:  People over 39 years old.  Patients on chemotherapy.  People with long-term (chronic) lung problems.  People with immune system problems. GET HELP RIGHT AWAY IF:   You are getting worse.  You cannot control your cough, and you are losing sleep.  You cough up blood.  Your pain gets worse, even with medicine.  You have a fever.  Any of your problems are getting worse, not better.  You have shortness of breath or chest pain. MAKE SURE YOU:   Understand these instructions.  Will watch your condition.  Will get help right away if you are not doing well or get worse. Document Released: 09/01/2007 Document Revised: 06/07/2011 Document Reviewed: 06/05/2010 [facility] Patient Information 2015 [place], [place]. This information is not intended to replace advice given to you by your health care provider. Make sure you discuss any questions you have with your health care provider.

## 2014-08-15 ENCOUNTER — Telehealth: Payer: Self-pay | Admitting: *Deleted

## 2014-08-15 ENCOUNTER — Encounter: Payer: Self-pay | Admitting: Internal Medicine

## 2014-08-15 ENCOUNTER — Encounter (INDEPENDENT_AMBULATORY_CARE_PROVIDER_SITE_OTHER): Payer: Self-pay

## 2014-08-15 ENCOUNTER — Ambulatory Visit (INDEPENDENT_AMBULATORY_CARE_PROVIDER_SITE_OTHER): Payer: Medicare Other | Admitting: Internal Medicine

## 2014-08-15 VITALS — BP 126/74 | HR 82 | Ht 68.0 in | Wt 252.0 lb

## 2014-08-15 DIAGNOSIS — D869 Sarcoidosis, unspecified: Secondary | ICD-10-CM | POA: Diagnosis not present

## 2014-08-15 DIAGNOSIS — K219 Gastro-esophageal reflux disease without esophagitis: Secondary | ICD-10-CM | POA: Diagnosis not present

## 2014-08-15 DIAGNOSIS — B20 Human immunodeficiency virus [HIV] disease: Secondary | ICD-10-CM | POA: Diagnosis not present

## 2014-08-15 DIAGNOSIS — D860 Sarcoidosis of lung: Secondary | ICD-10-CM

## 2014-08-15 MED ORDER — PANTOPRAZOLE SODIUM 20 MG PO TBEC: 20.0000 mg | DELAYED_RELEASE_TABLET | Freq: Every day | ORAL | Status: DC

## 2014-08-15 MED ORDER — PREDNISONE 10 MG PO TABS: ORAL_TABLET | ORAL | Status: DC

## 2014-08-15 NOTE — Assessment & Plan Note (Signed)
Managed by Infectious Disease. Specific concern now as she starts steroid therapy for sarcoid.

## 2014-08-15 NOTE — Progress Notes (Signed)
07/15/14- 39 yo M/ F transgender never smoker w/ hx syphilis, HIV  referred by Dr. Zigmund Daniel; Wheezing; Choking sensation; hard to breathe; Syncope episodes after coughing so hard it caused her to faint; unable to eat, will throw up her food, has lost 24lbs due to inability to eat; History of asthma since childhood.  PFT done 10/25/2013- severe obstructive airways disease- with insignificant response to bronchodilator, possible restriction, normal diffusion FVC 3.13/73%, FEV1 1.86/53%, FEV1/FVC 0.59, FEF 25-75 percent 0.95, TLC 83%, DLCO 89%. She is aware of active reflux with easy shortness of breath especially while coughing. Scant white sputum. Little heartburn. Some wheeze but albuterol inhaler is not much help. Wakes at night coughing and choking short of breath if she lies on left side. CT chest 02/07/14-  IMPRESSION: No evidence of pulmonary embolism. Multifocal patchy/ nodular opacities, left lower lobe predominant, suspicious for multifocal pneumonia. Mediastinal/bilateral hilar lymphadenopathy, likely reactive. Electronically Signed  By: Julian Hy M.D.  On: 02/07/2014 11: CXR 05/25/14 IMPRESSION: Bilateral lymphadenopathy in the hila as well as left basilar patchy infiltrate. This raises suspicion for possible sarcoidosis given its relative chronicity. Alternatively this may represent recurrent multifocal pneumonia. Electronically Signed  By: Inez Catalina M.D.  On: 05/25/2014 10:40  07/22/14- 48 yo M/ F transgender never smoker w/ hx syphilis, HIV followed for interstitial infiltrates, cough, GERD, Sarcoid Follows for: hasn't heard from [facility]: lab results; SOB at all times; prod cough at times w/clear mucus; CP with lots of coughing; ACE 06/2014- >100     BNP 18.5     CMET creat 1.21  Hgb 12.9  Troponin 0 Cough is about the same, productive of white sputum. Denies night sweats or adenopathy. Some minor arthralgias in the knees. DME company is coming today to her home  bringing nebulizer machine. Anoro didn't help much.  08/15/14- 41 yo M/ F transgender never smoker followed for Sarcoid w/ hx syphilis, HIV followed for interstitial infiltrates, cough, GERD,  Bronch 08/07/14 + sarcoid with severe airway involvement I had communicated w Dr Zigmund Daniel PCP and Dr Drucilla Schmidt for Dr Megan Salon ID.  Follows ATF:TDDUK HAVING SOB SINCE LAST VISIT, COUGHING HAS GOTTEN WORST CAN HARDLY HOLD FOOD DOWN HAS LOST 9 LBS SINCE LAST VISIT Discussed sarcoid and prednisone in context of HIV, with UpTo Date patient printout. Again emphasized importance of reflux control. I still think reflux is a likely contributor to the cough problem. I really pressed point that she must be very careful with her antiviral therapy as we start steroids. Discussed steroid side effects.  ROS-see HPI   Negative unless "+" Constitutional:    +weight loss, night sweats, fevers, chills, fatigue, lassitude. HEENT:    headaches, difficulty swallowing, tooth/dental problems, sore throat,       sneezing, itching, ear ache, nasal congestion, post nasal drip, snoring CV:    chest pain, orthopnea, PND, swelling in lower extremities, anasarca,                                  dizziness, palpitations Resp:   +shortness of breath with exertion or at rest.                +productive cough,   non-productive cough, coughing up of blood.              change in color of mucus.  wheezing.   Skin:    rash or lesions. GI: + heartburn, indigestion, abdominal pain, nausea,  vomiting, GU:  MS:   joint pain, stiffness,  Neuro-     nothing unusual Psych:  change in mood or affect.  depression or anxiety.   memory loss.  OBJ- Physical Exam  +presents as male General- Alert, Oriented, Affect-appropriate, Distress- none acute Skin- rash-none, lesions- none, excoriation- none, + tattoos Lymphadenopathy- none Head- atraumatic            Eyes- Gross vision intact, PERRLA, conjunctivae and secretions clear            Ears- Hearing,  canals-normal            Nose- Clear, no-Septal dev, mucus, polyps, erosion, perforation             Throat- Mallampati II , mucosa clear , drainage- none, tonsils- atrophic Neck- flexible , trachea midline, no stridor , thyroid nl, carotid no bruit Chest - symmetrical excursion , unlabored           Heart/CV- RRR , no murmur , no gallop  , no rub, nl s1 s2                           - JVD- none , edema- none, stasis changes- none, varices- none           Lung- +bilateral wheezes/rhonchi, cough+ dry , dullness-none, rub- none           Chest wall-  Abd-  Br/ Gen/ Rectal- Not done, not indicated Extrem- cyanosis- none, clubbing, none, atrophy- none, strength- nl Neuro- grossly intact to observation

## 2014-08-15 NOTE — Patient Instructions (Signed)
Script sent to start prednisone for now at 40 mg daily  Script sent to refill the protonix acid blocker- take one daily before breakfast  Order- return appointment to Dr Campbell/ Infectious Disease as soon as possible   Dx Sarcoid, HIV

## 2014-08-15 NOTE — Telephone Encounter (Signed)
Pt scheduled with [name] for 08/27/14 @ 1015.  Per [name] the pt may stay on Stribild until this visit.  Pt verbalized understanding.

## 2014-08-15 NOTE — Assessment & Plan Note (Signed)
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 Plan-reflux precautions, continue proton pump inhibitor

## 2014-08-15 NOTE — Assessment & Plan Note (Signed)
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 We discussed treatment options including steroids and methotrexate and emphasized infectious disease concerns. I explained that I had been in touch with Dr. Hale Bogus group.  Plan-begin moderate dose prednisone at 40 mg daily. Schedule early follow-up with [name].

## 2014-08-27 ENCOUNTER — Ambulatory Visit (INDEPENDENT_AMBULATORY_CARE_PROVIDER_SITE_OTHER): Payer: Medicare Other | Admitting: Internal Medicine

## 2014-08-27 ENCOUNTER — Encounter: Payer: Self-pay | Admitting: Internal Medicine

## 2014-08-27 VITALS — BP 130/87 | HR 84 | Temp 98.1°F | Wt 243.5 lb

## 2014-08-27 DIAGNOSIS — B20 Human immunodeficiency virus [HIV] disease: Secondary | ICD-10-CM

## 2014-08-27 DIAGNOSIS — Z79899 Other long term (current) drug therapy: Secondary | ICD-10-CM | POA: Diagnosis not present

## 2014-08-27 LAB — LIPID PANEL
Cholesterol: 119 mg/dL (ref 0–200)
HDL: 46 mg/dL (ref >=40)
LDL Cholesterol: 62 mg/dL (ref 0–99)
TRIGLYCERIDES: 53 mg/dL (ref <150)
Total CHOL/HDL Ratio: 2.6 Ratio
VLDL: 11 mg/dL (ref 0–40)

## 2014-08-27 LAB — COMPREHENSIVE METABOLIC PANEL
ALBUMIN: 3.4 g/dL — AB (ref 3.5–5.2)
ALT: 20 U/L (ref 0–53)
AST: 25 U/L (ref 0–37)
Alkaline Phosphatase: 58 U/L (ref 39–117)
BUN: 17 mg/dL (ref 6–23)
CALCIUM: 8.9 mg/dL (ref 8.4–10.5)
CO2: 24 mEq/L (ref 19–32)
Chloride: 104 mEq/L (ref 96–112)
Creat: 0.95 mg/dL (ref 0.50–1.35)
Glucose, Bld: 99 mg/dL (ref 70–99)
POTASSIUM: 4.3 meq/L (ref 3.5–5.3)
SODIUM: 137 meq/L (ref 135–145)
TOTAL PROTEIN: 6.9 g/dL (ref 6.0–8.3)
Total Bilirubin: 0.5 mg/dL (ref 0.2–1.2)

## 2014-08-27 LAB — CBC
HEMATOCRIT: 42.5 % (ref 39.0–52.0)
Hemoglobin: 14.7 g/dL (ref 13.0–17.0)
MCH: 30.2 pg (ref 26.0–34.0)
MCHC: 34.6 g/dL (ref 30.0–36.0)
MCV: 87.3 fL (ref 78.0–100.0)
MPV: 10.4 fL (ref 8.6–12.4)
Platelets: 255 10*3/uL (ref 150–400)
RBC: 4.87 MIL/uL (ref 4.22–5.81)
RDW: 16.1 % — AB (ref 11.5–15.5)
WBC: 16.8 10*3/uL — ABNORMAL HIGH (ref 4.0–10.5)

## 2014-08-27 MED ORDER — DOLUTEGRAVIR SODIUM 50 MG PO TABS: 50.0000 mg | ORAL_TABLET | Freq: Every day | ORAL | Status: DC

## 2014-08-27 MED ORDER — EMTRICITABINE-TENOFOVIR DF 200-300 MG PO TABS: 1.0000 | ORAL_TABLET | Freq: Every day | ORAL | Status: DC

## 2014-08-27 NOTE — Progress Notes (Signed)
Patient ID: Robert Dawson, male   DOB: 13-Apr-1975, 39 y.o.   MRN: [id number] HPI: Robert Dawson is a 39 y.o. male is here for her HIV f/u.   Allergies: No Known Allergies  Vitals: Temp: 98.1 F (36.7 C) (05/31 1015) Temp Source: Oral (05/31 1015) BP: 130/87 mmHg (05/31 1015) Pulse Rate: 84 (05/31 1015)  Past Medical History: Past Medical History  Diagnosis Date  . HIV disease   . Immune deficiency disorder   . Depression   . Allergy     Social History: History   Social History  . Marital Status: Single    Spouse Name: N/A  . Number of Children: N/A  . Years of Education: N/A   Social History Main Topics  . Smoking status: Never Smoker   . Smokeless tobacco: Never Used  . Alcohol Use: No  . Drug Use: No  . Sexual Activity: Not Currently     Comment: given condoms. Pt is an under.   Other Topics Concern  . None   Social History Narrative    Previous Regimen:   Current Regimen: Stribild  Labs: HIV 1 RNA QUANT (copies/mL)  Date Value  04/02/2013 4953*  12/12/2012 1933*  07/27/2012 1328*   CD4 T CELL ABS  Date Value  04/02/2013 580 /uL  12/12/2012 500 /uL  07/27/2012 780 cmm   HEP B S AB (no units)  Date Value  11/07/2007 POS*   HEPATITIS B SURFACE AG (no units)  Date Value  05/23/2006 NO   HCV AB (no units)  Date Value  05/23/2006 NO    CrCl: CrCl cannot be calculated (Patient has no serum creatinine result on file.).  Lipids:    Component Value Date/Time   CHOL 108 12/12/2012 1000   TRIG 107 12/12/2012 1000   HDL 23* 12/12/2012 1000   CHOLHDL 4.7 12/12/2012 1000   VLDL 21 12/12/2012 1000   LDLCALC 64 12/12/2012 1000    Assessment: She has not been here since early 2015 for her HIV. Therefore, we don't have any labs on her since then. She is currently on Stribild. She stated that she has not missed doses. Dr. Annamaria Boots has recently put her on Advair and Ellipta for her asthma. Fluticasone in Advair would be a big issue here due  to interactions. She is probably going to be on it long term. We are going to change her ART to something that would not interact with it. The plan is to get labs today and change Stribild to Tivicay and Truvada. We'll get HLA to see if we can simplify in the future.   Recommendations:  Dc Stribild Start Tivicay 44m PO qday Start Truvada 1 PO qday Get [id number]  PWilfred Lacy PharmD Clinical Infectious [facility] 08/27/2014, 10:46 AM

## 2014-08-27 NOTE — Progress Notes (Signed)
Patient ID: Robert Dawson, male   DOB: June 19, 1975, 39 y.o.   MRN: [id number]          Patient Active Problem List   Diagnosis Date Noted  . Sarcoidosis of lung 01/31/2014    Priority: High  . Human immunodeficiency virus (HIV) disease 04/26/2006    Priority: High  . Leg pain 04/02/2013    Priority: Medium  . DEPRESSION 04/26/2006    Priority: Medium  . GERD (gastroesophageal reflux disease) 08/15/2014  . Pulmonary infiltrates 07/25/2014  . Bronchitis, chronic obstructive w acute bronchitis 07/16/2014  . SOB (shortness of breath) 02/04/2014  . Syncope 02/04/2014  . Immunization due 01/31/2014  . Cough 01/31/2014  . Tachycardia 01/31/2014  . Acute bronchitis 10/03/2013  . Generalized swelling, mass, or lump of abdomen or pelvis 09/21/2013  . Other allergic rhinitis 09/21/2013  . Essential hypertension 09/21/2013  . Preventative health care 09/21/2013  . Depression 09/21/2013  . Transgendered 09/21/2013  . Wheezing 08/07/2013  . Peripheral neuropathy 08/31/2010  . MOLE 10/14/2009  . SKIN RASH 10/14/2009  . MORBID OBESITY 04/22/2009  . 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 ALLERGIC RHINITIS 04/26/2006  . Hilar adenopathy 04/26/2006    Patient's Medications  New Prescriptions   DOLUTEGRAVIR (TIVICAY) 50 MG TABLET    Take 1 tablet (50 mg total) by mouth daily.   EMTRICITABINE-TENOFOVIR (TRUVADA) 200-300 MG PER TABLET    Take 1 tablet by mouth daily.  Previous Medications   ALBUTEROL (PROVENTIL) (2.5 MG/3ML) 0.083% NEBULIZER SOLUTION    Take 3 mLs (2.5 mg total) by nebulization every 4 (four) hours as needed for wheezing or shortness of breath.   FLUTICASONE-SALMETEROL (ADVAIR DISKUS) 250-50 MCG/DOSE AEPB    Inhale 1 puff into the lungs 2 (two) times daily.   HYDROCODONE-ACETAMINOPHEN (NORCO) 5-325 MG PER TABLET    Take 1 tablet by mouth every 4 (four) hours as needed for moderate pain.   NEBULIZERS  (COMPRESSOR NEBULIZER) MISC    1 Device by Does not apply route every 6 (six) hours as needed.   PANTOPRAZOLE (PROTONIX) 20 MG TABLET    Take 1 tablet (20 mg total) by mouth daily.   PREDNISONE (DELTASONE) 10 MG TABLET    4 daily or as directed   UMECLIDINIUM-VILANTEROL 62.5-25 MCG/INH AEPB    1 puff, once daily  Modified Medications   No medications on file  Discontinued Medications   AZITHROMYCIN (ZITHROMAX) 250 MG TABLET    1 PO DAILY START 5/17.   STRIBILD 150-150-200-300 MG TABS TABLET    Take 1 tablet by mouth daily.     Subjective: Robert Dawson is in for her first visit with me in the past year for HIV follow-up. At the time of that last visit she started on Stribild. She states that she has been taking it faithfully. However she takes it each morning on an empty stomach rather than with a meal. She has had some problems with nausea and vomiting over the past year and has lost weight. She is hoping that she can keep the weight off. She was recently diagnosed with pulmonary sarcoidosis. She was started on prednisone and Advair. She is feeling much better. She states that she has not used her Advair in the past week. She states that her mood has been improved over the past year.  Review of Systems: Constitutional: positive for anorexia and weight loss, negative for chills, fevers, malaise and sweats Eyes: negative Ears,  nose, mouth, throat, and face: negative Respiratory: recent improvement in her cough and dyspnea on exertion Cardiovascular: negative Gastrointestinal: positive for nausea and vomiting, negative for abdominal pain, change in bowel habits, constipation, diarrhea and reflux symptoms Genitourinary:negative  Past Medical History  Diagnosis Date  . HIV disease   . Immune deficiency disorder   . Depression   . Allergy     History  Substance Use Topics  . Smoking status: Never Smoker   . Smokeless tobacco: Never Used  . Alcohol Use: No    Family History  Problem Relation  Age of Onset  . Diabetes Maternal Grandmother   . Hypertension Maternal Grandmother     No Known Allergies  Objective: Temp: 98.1 F (36.7 C) (05/31 1015) Temp Source: Oral (05/31 1015) BP: 130/87 mmHg (05/31 1015) Pulse Rate: 84 (05/31 1015) Body mass index is 37.03 kg/(m^2).  General: Her weight is down in the past year from 297 pounds to 243 Oral: No oropharyngeal lesions Skin: No rash Lungs: Clear Cor: Regular S1 and S2 with no murmurs Abdomen: Soft and nontender with normal bowel sounds  Joints and extremities: Normal Neuro: Alert with normal speech and conversation Mood: Normal. She does not appear depressed or anxious  Lab Results Lab Results  Component Value Date   WBC 5.8 07/17/2014   HGB 12.9* 07/17/2014   HCT 39.2 07/17/2014   MCV 89.5 07/17/2014   PLT 233 07/17/2014    Lab Results  Component Value Date   CREATININE 1.21 07/17/2014   BUN 9 07/17/2014   NA 137 07/17/2014   K 3.9 07/17/2014   CL 104 07/17/2014   CO2 25 07/17/2014    Lab Results  Component Value Date   ALT 17 07/17/2014   AST 52* 07/17/2014   ALKPHOS 97 07/17/2014   BILITOT 0.6 07/17/2014    Lab Results  Component Value Date   CHOL 108 12/12/2012   HDL 23* 12/12/2012   LDLCALC 64 12/12/2012   TRIG 107 12/12/2012   CHOLHDL 4.7 12/12/2012    Lab Results HIV 1 RNA QUANT (copies/mL)  Date Value  04/02/2013 4953*  12/12/2012 1933*  07/27/2012 1328*   CD4 T CELL ABS  Date Value  04/02/2013 580 /uL  12/12/2012 500 /uL  07/27/2012 780 cmm     Assessment: She is not taking her Stribild correctly with a meal each day. There is also interactions between the cobiscistat component and the prednisone and fluticasone. I will change her to once daily Truvada and Tivicay. I will check repeat lab work today.  I encouraged her to take her Advair twice daily as instructed.  I do not know the reason for her nausea and vomiting. She does not associate it with any of her  medications.  Her depression has improved.  Plan: 1. Change Stribild to Truvada and Tivicay 2. Check lab work today 3. Follow-up in one month   Michel Bickers, MD [facility] [phone] pager   [phone] cell 08/27/2014, 10:43 AM

## 2014-08-28 LAB — T-HELPER CELL (CD4) - (RCID CLINIC ONLY)
CD4 T CELL ABS: 340 /uL — AB (ref 400–2700)
CD4 T CELL HELPER: 17 % — AB (ref 33–55)

## 2014-08-28 LAB — HIV-1 RNA QUANT-NO REFLEX-BLD: HIV-1 RNA Quant, Log: <1.3 {Log} (ref <1.30)

## 2014-08-28 LAB — RPR

## 2014-09-01 LAB — FUNGUS CULTURE W SMEAR: Fungal Smear: NONE SEEN

## 2014-09-03 LAB — HLA B*5701: HLA-B 5701 W/RFLX HLA-B HIGH: NEGATIVE

## 2014-09-06 ENCOUNTER — Other Ambulatory Visit: Payer: Self-pay | Admitting: Internal Medicine

## 2014-09-06 ENCOUNTER — Ambulatory Visit: Payer: Medicare Other | Admitting: Internal Medicine

## 2014-09-18 ENCOUNTER — Ambulatory Visit: Payer: Medicare Other | Admitting: Internal Medicine

## 2014-09-18 LAB — AFB CULTURE WITH SMEAR (NOT AT ARMC): ACID FAST SMEAR: NONE SEEN

## 2014-09-19 ENCOUNTER — Ambulatory Visit (INDEPENDENT_AMBULATORY_CARE_PROVIDER_SITE_OTHER): Payer: Medicare Other | Admitting: Internal Medicine

## 2014-09-19 ENCOUNTER — Ambulatory Visit (INDEPENDENT_AMBULATORY_CARE_PROVIDER_SITE_OTHER)
Admission: RE | Admit: 2014-09-19 | Discharge: 2014-09-19 | Disposition: A | Discharge status: Home / Self Care | Payer: Medicare Other | Source: Ambulatory Visit | Attending: Internal Medicine | Admitting: Internal Medicine

## 2014-09-19 ENCOUNTER — Encounter: Payer: Self-pay | Admitting: Internal Medicine

## 2014-09-19 VITALS — BP 122/76 | HR 92 | Ht 68.0 in | Wt 259.0 lb

## 2014-09-19 DIAGNOSIS — J440 Chronic obstructive pulmonary disease with acute lower respiratory infection: Secondary | ICD-10-CM

## 2014-09-19 DIAGNOSIS — D869 Sarcoidosis, unspecified: Secondary | ICD-10-CM | POA: Diagnosis not present

## 2014-09-19 DIAGNOSIS — D860 Sarcoidosis of lung: Secondary | ICD-10-CM

## 2014-09-19 DIAGNOSIS — K219 Gastro-esophageal reflux disease without esophagitis: Secondary | ICD-10-CM

## 2014-09-19 DIAGNOSIS — R918 Other nonspecific abnormal finding of lung field: Secondary | ICD-10-CM

## 2014-09-19 DIAGNOSIS — L732 Hidradenitis suppurativa: Secondary | ICD-10-CM

## 2014-09-19 MED ORDER — PREDNISONE 10 MG PO TABS: ORAL_TABLET | ORAL | Status: DC

## 2014-09-19 MED ORDER — DOXYCYCLINE HYCLATE 100 MG PO TABS: ORAL_TABLET | ORAL | Status: DC

## 2014-09-19 NOTE — Assessment & Plan Note (Signed)
Substantially resolved on prednisone indicating this is likely all from sarcoid, although a component of aspiration pneumonitis can't be excluded.

## 2014-09-19 NOTE — Assessment & Plan Note (Signed)
Dramatic improvement in cough and rhonchi with initiation of steroid therapy. She is not been needing bronchodilators.

## 2014-09-19 NOTE — Assessment & Plan Note (Addendum)
Significant clinical improvement with prednisone Steroid talk done again Plan-reduce prednisone to 30 mg daily

## 2014-09-19 NOTE — Assessment & Plan Note (Signed)
She has never had this before so there is probably connection to her steroid therapy Plan-educated on skin care, don't shave, use baking soda instead of deodorant/antiperspirant, short course doxycycline, reduced dose of prednisone

## 2014-09-19 NOTE — Progress Notes (Signed)
07/15/14- 39 yo M/ F transgender never smoker w/ hx syphilis, HIV  referred by Dr. Zigmund Daniel; Wheezing; Choking sensation; hard to breathe; Syncope episodes after coughing so hard it caused her to faint; unable to eat, will throw up her food, has lost 24lbs due to inability to eat; History of asthma since childhood.  PFT done 10/25/2013- severe obstructive airways disease- with insignificant response to bronchodilator, possible restriction, normal diffusion FVC 3.13/73%, FEV1 1.86/53%, FEV1/FVC 0.59, FEF 25-75 percent 0.95, TLC 83%, DLCO 89%. She is aware of active reflux with easy shortness of breath especially while coughing. Scant white sputum. Little heartburn. Some wheeze but albuterol inhaler is not much help. Wakes at night coughing and choking short of breath if she lies on left side. CT chest 02/07/14-  IMPRESSION: No evidence of pulmonary embolism. Multifocal patchy/ nodular opacities, left lower lobe predominant, suspicious for multifocal pneumonia. Mediastinal/bilateral hilar lymphadenopathy, likely reactive. Electronically Signed  By: Julian Hy M.D.  On: 02/07/2014 11: CXR 05/25/14 IMPRESSION: Bilateral lymphadenopathy in the hila as well as left basilar patchy infiltrate. This raises suspicion for possible sarcoidosis given its relative chronicity. Alternatively this may represent recurrent multifocal pneumonia. Electronically Signed  By: Inez Catalina M.D.  On: 05/25/2014 10:40  07/22/14- 39 yo M/ F transgender never smoker w/ hx syphilis, HIV followed for interstitial infiltrates, cough, GERD, Sarcoid Follows for: hasn't heard from [facility]: lab results; SOB at all times; prod cough at times w/clear mucus; CP with lots of coughing; ACE 06/2014- >100     BNP 18.5     CMET creat 1.21  Hgb 12.9  Troponin 0 Cough is about the same, productive of white sputum. Denies night sweats or adenopathy. Some minor arthralgias in the knees. DME company is coming today to her home  bringing nebulizer machine. Anoro didn't help much.  08/15/14- 39 yo M/ F transgender never smoker followed for Sarcoid w/ hx syphilis, HIV followed for interstitial infiltrates, cough, GERD,  Bronch 08/07/14 + sarcoid with severe airway involvement I had communicated w Dr Zigmund Daniel PCP and Dr Drucilla Schmidt for Dr Megan Salon ID.  Follows [id number] HAVING SOB SINCE LAST VISIT, COUGHING HAS GOTTEN WORST CAN HARDLY HOLD FOOD DOWN HAS LOST 9 LBS SINCE LAST VISIT Discussed sarcoid and prednisone in context of HIV, with UpTo Date patient printout. Again emphasized importance of reflux control. I still think reflux is a likely contributor to the cough problem. I really pressed point that she must be very careful with her antiviral therapy as we start steroids. Discussed steroid side effects.  09/19/14- 39 yo M/ F transgender never smoker followed for Sarcoid w/ hx syphilis, HIV followed for interstitial infiltrates, cough, GERD, complicated by depression  Bronch 08/07/14 + sarcoid with severe airway involvement Prednisone begun 5/19 40 mg daily Being followed on antivirals by Dr Campbell/ ID FOLLOWS FOR: Pt c/o stable sob with exertion.  Pt c/o L sided underarm swelling after starting prednisone.    CXR 08/12/14 IMPRESSION: Patchy right upper lobe infiltrate. This is new from the prior exam with resolution of previously seen left-sided infiltrate. This likely represents some waxing and waning infiltrates. Electronically Signed  By: Inez Catalina M.D.  On: 08/12/2014 15:00   ROS-see HPI   Negative unless "+" Constitutional:    +weight loss, night sweats, fevers, chills, fatigue, lassitude. HEENT:    headaches, difficulty swallowing, tooth/dental problems, sore throat,       sneezing, itching, ear ache, nasal congestion, post nasal drip, snoring CV:    chest pain, orthopnea, PND, swelling  in lower extremities, anasarca,                                  dizziness, palpitations Resp:   +shortness of breath  with exertion or at rest.                +productive cough,   non-productive cough, coughing up of blood.              change in color of mucus.  wheezing.   Skin:    rash or lesions. GI: + heartburn, indigestion, abdominal pain, nausea, vomiting, GU:  MS:   joint pain, stiffness,  Neuro-     nothing unusual Psych:  change in mood or affect.  depression or anxiety.   memory loss.  OBJ- Physical Exam  +presents as male General- Alert, Oriented, Affect-appropriate, Distress- none acute Skin- rash-none, lesions- none, excoriation- none, + tattoos Lymphadenopathy- none Head- atraumatic            Eyes- Gross vision intact, PERRLA, conjunctivae and secretions clear            Ears- Hearing, canals-normal            Nose- Clear, no-Septal dev, mucus, polyps, erosion, perforation             Throat- Mallampati II , mucosa clear , drainage- none, tonsils- atrophic Neck- flexible , trachea midline, no stridor , thyroid nl, carotid no bruit Chest - symmetrical excursion , unlabored           Heart/CV- RRR , no murmur , no gallop  , no rub, nl s1 s2                           - JVD- none , edema- none, stasis changes- none, varices- none           Lung- +bilateral wheezes/rhonchi, cough+ dry , dullness-none, rub- none           Chest wall-  Abd-  Br/ Gen/ Rectal- Not done, not indicated Extrem- cyanosis- none, clubbing, none, atrophy- none, strength- nl Neuro- grossly intact to observation

## 2014-09-19 NOTE — Assessment & Plan Note (Signed)
Reminded of reflux precautions 

## 2014-09-19 NOTE — Progress Notes (Signed)
07/15/14- 39 yo M/ F transgender never smoker w/ hx syphilis, HIV  referred by Dr. Zigmund Daniel; Wheezing; Choking sensation; hard to breathe; Syncope episodes after coughing so hard it caused her to faint; unable to eat, will throw up her food, has lost 24lbs due to inability to eat; History of asthma since childhood.  PFT done 10/25/2013- severe obstructive airways disease- with insignificant response to bronchodilator, possible restriction, normal diffusion FVC 3.13/73%, FEV1 1.86/53%, FEV1/FVC 0.59, FEF 25-75 percent 0.95, TLC 83%, DLCO 89%. She is aware of active reflux with easy shortness of breath especially while coughing. Scant white sputum. Little heartburn. Some wheeze but albuterol inhaler is not much help. Wakes at night coughing and choking short of breath if she lies on left side. CT chest 02/07/14-  IMPRESSION: No evidence of pulmonary embolism. Multifocal patchy/ nodular opacities, left lower lobe predominant, suspicious for multifocal pneumonia. Mediastinal/bilateral hilar lymphadenopathy, likely reactive. Electronically Signed  By: Julian Hy M.D.  On: 02/07/2014 11: CXR 05/25/14 IMPRESSION: Bilateral lymphadenopathy in the hila as well as left basilar patchy infiltrate. This raises suspicion for possible sarcoidosis given its relative chronicity. Alternatively this may represent recurrent multifocal pneumonia. Electronically Signed  By: Inez Catalina M.D.  On: 05/25/2014 10:40  07/22/14- 39 yo M/ F transgender never smoker w/ hx syphilis, HIV followed for interstitial infiltrates, cough, GERD, Sarcoid Follows for: hasn't heard from [facility]: lab results; SOB at all times; prod cough at times w/clear mucus; CP with lots of coughing; ACE 06/2014- >100     BNP 18.5     CMET creat 1.21  Hgb 12.9  Troponin 0 Cough is about the same, productive of white sputum. Denies night sweats or adenopathy. Some minor arthralgias in the knees. DME company is coming today to her home  bringing nebulizer machine. Anoro didn't help much.  08/15/14- 39 yo M/ F transgender never smoker followed for Sarcoid w/ hx syphilis, HIV followed for interstitial infiltrates, cough, GERD,  Bronch 08/07/14 + sarcoid with severe airway involvement I had communicated w Dr Zigmund Daniel PCP and Dr Drucilla Schmidt for Dr Megan Salon ID.  Follows [id number] HAVING SOB SINCE LAST VISIT, COUGHING HAS GOTTEN WORST CAN HARDLY HOLD FOOD DOWN HAS LOST 9 LBS SINCE LAST VISIT Discussed sarcoid and prednisone in context of HIV, with UpTo Date patient printout. Again emphasized importance of reflux control. I still think reflux is a likely contributor to the cough problem. I really pressed point that she must be very careful with her antiviral therapy as we start steroids. Discussed steroid side effects.  09/19/14- 39 yo M/ F transgender never smoker followed for Sarcoid w/ hx syphilis, HIV followed for interstitial infiltrates, cough, GERD, complicated by depression  Bronch 08/07/14 + sarcoid with severe airway involvement Prednisone begun 5/19 40 mg daily Being followed on antivirals by Dr Campbell/ ID FOLLOWS FOR: Pt c/o stable sob with exertion.  Pt c/o L sided underarm swelling after starting prednisone.   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 Never before. Cough much better on prednisone. Still DOE doing housework. Much less need for inhalers. CXR 08/12/14 IMPRESSION: Patchy right upper lobe infiltrate. This is new from the prior exam with resolution of previously seen left-sided infiltrate. This likely represents some waxing and waning infiltrates. Electronically Signed  By: Inez Catalina M.D.  On: 08/12/2014 15:00  ROS-see HPI   Negative unless "+" Constitutional:    weight loss, night sweats, fevers, chills, fatigue, lassitude. HEENT:    headaches, difficulty swallowing, tooth/dental problems, sore throat,  sneezing, itching, ear ache, nasal congestion, post nasal drip, snoring CV:     chest pain, orthopnea, PND, swelling in lower extremities, anasarca,                                  dizziness, palpitations Resp:   +shortness of breath with exertion or at rest.                productive cough,   non-productive cough, coughing up of blood.              change in color of mucus.  wheezing.   Skin:    rash or lesions. GI: + heartburn, indigestion, abdominal pain, nausea, vomiting, GU:  MS:   joint pain, stiffness,  Neuro-     nothing unusual Psych:  change in mood or affect.  depression or anxiety.   memory loss.  OBJ- Physical Exam  +presents as male General- Alert, Oriented, Affect-appropriate, Distress- none acute Skin- rash-none, lesions- none, excoriation- none, + tattoos Lymphadenopathy- L axilla puffy, tender Head- atraumatic            Eyes- Gross vision intact, PERRLA, conjunctivae and secretions clear            Ears- Hearing, canals-normal            Nose- Clear, no-Septal dev, mucus, polyps, erosion, perforation             Throat- Mallampati II , mucosa+tongue lightly coated , drainage- none, tonsils- atrophic Neck- flexible , trachea midline, no stridor , thyroid nl, carotid no bruit Chest - symmetrical excursion , unlabored           Heart/CV- RRR , no murmur , no gallop  , no rub, nl s1 s2                           - JVD- none , edema- none, stasis changes- none, varices- none           Lung- Clear, unlabored, cough-none , dullness-none, rub- none           Chest wall-  Abd-  Br/ Gen/ Rectal- Not done, not indicated Extrem- cyanosis- none, clubbing, none, atrophy- none, strength- nl Neuro- grossly intact to observation

## 2014-09-19 NOTE — Patient Instructions (Addendum)
We are reducing prednisone to 30 mg daily- three tabs. Script sent for refill.  Script sent for antibiotic doxycycline to help the infection under your arm  Don't shave under L arm until this clears up Use warm soapy compresses Use [employer] baking soda under L arm instead of deodorant Let Dr Megan Salon check under your arm when you see him next on 6/28  Order- CXR   Dx sarcoid

## 2014-09-24 ENCOUNTER — Encounter: Payer: Self-pay | Admitting: Internal Medicine

## 2014-09-24 ENCOUNTER — Ambulatory Visit (INDEPENDENT_AMBULATORY_CARE_PROVIDER_SITE_OTHER): Payer: Medicare Other | Admitting: Internal Medicine

## 2014-09-24 VITALS — BP 127/89 | HR 80 | Temp 98.7°F | Wt 256.2 lb

## 2014-09-24 DIAGNOSIS — B20 Human immunodeficiency virus [HIV] disease: Secondary | ICD-10-CM | POA: Diagnosis not present

## 2014-09-24 DIAGNOSIS — K219 Gastro-esophageal reflux disease without esophagitis: Secondary | ICD-10-CM

## 2014-09-24 MED ORDER — NAPROXEN 250 MG PO TABS: 250.0000 mg | ORAL_TABLET | Freq: Two times a day (BID) | ORAL | Status: DC

## 2014-09-24 MED ORDER — EMTRICITABINE-TENOFOVIR AF 200-25 MG PO TABS: 1.0000 | ORAL_TABLET | Freq: Every day | ORAL | Status: DC

## 2014-09-24 MED ORDER — PANTOPRAZOLE SODIUM 40 MG PO TBEC: 40.0000 mg | DELAYED_RELEASE_TABLET | Freq: Once | ORAL | Status: DC

## 2014-09-24 NOTE — Progress Notes (Signed)
Patient ID: Robert Dawson, male   DOB: [birth year], 39 y.o.   MRN: [id number]          Patient Active Problem List   Diagnosis Date Noted  . Sarcoidosis of lung 01/31/2014    Priority: High  . Human immunodeficiency virus (HIV) disease 04/26/2006    Priority: High  . Leg pain 04/02/2013    Priority: Medium  . DEPRESSION 04/26/2006    Priority: Medium  . 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 GERD (gastroesophageal reflux disease) 08/15/2014  . Pulmonary infiltrates 07/25/2014  . Bronchitis, chronic obstructive w acute bronchitis 07/16/2014  . SOB (shortness of breath) 02/04/2014  . Syncope 02/04/2014  . Immunization due 01/31/2014  . Cough 01/31/2014  . Tachycardia 01/31/2014  . Acute bronchitis 10/03/2013  . Generalized swelling, mass, or lump of abdomen or pelvis 09/21/2013  . Other allergic rhinitis 09/21/2013  . Essential hypertension 09/21/2013  . Preventative health care 09/21/2013  . Depression 09/21/2013  . Transgendered 09/21/2013  . Wheezing 08/07/2013  . Peripheral neuropathy 08/31/2010  . MOLE 10/14/2009  . SKIN RASH 10/14/2009  . MORBID OBESITY 04/22/2009  . 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 ALLERGIC RHINITIS 04/26/2006  . Hilar adenopathy 04/26/2006    Patient's Medications  New Prescriptions   EMTRICITABINE-TENOFOVIR AF (DESCOVY) 200-25 MG PER TABLET    Take 1 tablet by mouth daily.   NAPROXEN (NAPROSYN) 250 MG TABLET    Take 1 tablet (250 mg total) by mouth 2 (two) times daily with a meal.  Previous Medications   ALBUTEROL (PROVENTIL) (2.5 MG/3ML) 0.083% NEBULIZER SOLUTION    Take 3 mLs (2.5 mg total) by nebulization every 4 (four) hours as needed for wheezing or shortness of breath.   DOLUTEGRAVIR (TIVICAY) 50 MG TABLET    Take 1 tablet (50 mg total) by mouth daily.   DOXYCYCLINE (VIBRA-TABS) 100 MG TABLET    2 today then one daily   NEBULIZERS (COMPRESSOR NEBULIZER) MISC    1  Device by Does not apply route every 6 (six) hours as needed.   PREDNISONE (DELTASONE) 10 MG TABLET    3 daily or as directed   UMECLIDINIUM-VILANTEROL 62.5-25 MCG/INH AEPB    1 puff, once daily  Modified Medications   No medications on file  Discontinued Medications   EMTRICITABINE-TENOFOVIR (TRUVADA) 200-300 MG PER TABLET    Take 1 tablet by mouth daily.   HYDROCODONE-ACETAMINOPHEN (NORCO) 5-325 MG PER TABLET    Take 1 tablet by mouth every 4 (four) hours as needed for moderate pain.   STRIBILD 150-150-200-300 MG TABS TABLET    TAKE 1 TABLET BY MOUTH DAILY WITH BREAKFAST    Subjective: Robert Dawson is in for her routine HIV follow-up visit. She denies missing any doses of her HIV medications since starting on Truvada and Tivicay recently. She tolerates them well. She remains on prednisone and is having much less problem with shortness of breath and cough. She recently developed painful swelling in her left axilla. Dr. Annamaria Boots started her on doxycycline recently and she is improving.  She recently started having pain in both thighs laterally over where she received a large silicone injections many years ago. She's also noted some discoloration of the overlying skin. She has not had any fever. She's not had any recent trauma to those areas.  She admits that she has been feeling depressed. She is willing to see one of our mental health counselors.  Review of Systems:  Constitutional: positive for malaise, negative for anorexia, chills, fevers, sweats and weight loss Eyes: negative Ears, nose, mouth, throat, and face: negative Respiratory: positive for cough, dyspnea on exertion and wheezing, negative for hemoptysis, pleurisy/chest pain and sputum Cardiovascular: negative Gastrointestinal: negative Genitourinary:negative  Past Medical History  Diagnosis Date  . HIV disease   . Immune deficiency disorder   . Depression   . Allergy     History  Substance Use Topics  . Smoking status: Never  Smoker   . Smokeless tobacco: Never Used  . Alcohol Use: No    Family History  Problem Relation Age of Onset  . Diabetes Maternal Grandmother   . Hypertension Maternal Grandmother     No Known Allergies  Objective: Temp: 98.7 F (37.1 C) (06/28 1039) Temp Source: Oral (06/28 1039) BP: 127/89 mmHg (06/28 1039) Pulse Rate: 80 (06/28 1039) Body mass index is 38.97 kg/(m^2).  General: Her weight is stable at 256 pounds Oral: No oropharyngeal lesions Skin: Multiple tattoos. No rash Lungs: Clear without wheezing Cor: Regular S1 and S2 with no murmurs Abdomen: Obese, soft and nontender Joints and extremities: She has black hyperpigmentation and some induration over both hips laterally. There is no warmth or cellulitis. There is no fluctuance. She has some erythematous swelling in the left axilla with overlying desquamation. There is no fluctuance or drainage. Neuro: Alert with normal speech and conversation Mood: She does appear somewhat depressed  Lab Results Lab Results  Component Value Date   WBC 16.8* 08/27/2014   HGB 14.7 08/27/2014   HCT 42.5 08/27/2014   MCV 87.3 08/27/2014   PLT 255 08/27/2014    Lab Results  Component Value Date   CREATININE 0.95 08/27/2014   BUN 17 08/27/2014   NA 137 08/27/2014   K 4.3 08/27/2014   CL 104 08/27/2014   CO2 24 08/27/2014    Lab Results  Component Value Date   ALT 20 08/27/2014   AST 25 08/27/2014   ALKPHOS 58 08/27/2014   BILITOT 0.5 08/27/2014    Lab Results  Component Value Date   CHOL 119 08/27/2014   HDL 46 08/27/2014   LDLCALC 62 08/27/2014   TRIG 53 08/27/2014   CHOLHDL 2.6 08/27/2014    Lab Results HIV 1 RNA QUANT (copies/mL)  Date Value  08/27/2014 <20  04/02/2013 4953*  12/12/2012 1933*   CD4 T CELL ABS (/uL)  Date Value  08/27/2014 340*  04/02/2013 580  12/12/2012 500     Assessment: Her HIV infection has come under better control as her adherence has improved. I will change Truvada to the  new preparation called Descovy and continue Tivicay.  She has chronic depression. I'll arrange for her to meet with our behavioral health counselor, Robert Dawson, today.  She may be having some type of delayed reaction to her silicone injections. She was requesting a prescription for narcotic pain medications. I told her I did not feel that that was a good solution. She may need a referral for further evaluation if her pain persists. I'm not sure what the optimal intervention might be.  Her left axillary boil is improving on empiric doxycycline therapy.  Plan: 1. Change Truvada to Descovy and continue Tivicay 2. Behavioral health counseling 3. Naprosyn or acetaminophen as needed for pain 4. Complete doxycycline therapy 5. Follow-up after lab work in 3 months   Michel Bickers, MD [facility] [place] [phone] pager   [phone] cell 09/24/2014, 11:07 AM

## 2014-10-02 ENCOUNTER — Other Ambulatory Visit: Payer: Self-pay | Admitting: Internal Medicine

## 2014-10-22 ENCOUNTER — Encounter: Payer: Self-pay | Admitting: Internal Medicine

## 2014-10-22 ENCOUNTER — Ambulatory Visit (INDEPENDENT_AMBULATORY_CARE_PROVIDER_SITE_OTHER): Payer: Medicare Other | Admitting: Internal Medicine

## 2014-10-22 VITALS — BP 132/68 | HR 83 | Ht 68.0 in | Wt 269.2 lb

## 2014-10-22 DIAGNOSIS — J440 Chronic obstructive pulmonary disease with acute lower respiratory infection: Secondary | ICD-10-CM

## 2014-10-22 DIAGNOSIS — D860 Sarcoidosis of lung: Secondary | ICD-10-CM | POA: Diagnosis not present

## 2014-10-22 DIAGNOSIS — L732 Hidradenitis suppurativa: Secondary | ICD-10-CM

## 2014-10-22 MED ORDER — PREDNISONE 10 MG PO TABS: ORAL_TABLET | ORAL | Status: DC

## 2014-10-22 NOTE — Assessment & Plan Note (Signed)
She is doing very well with significant reduction in airway inflammation. We reviewed chest x-ray images demonstrating resolution of right upper lobe infiltrate component. Coarse markings remain in lower zones. I don't find adenopathy or rash. Steroid discussion again. Plan-reduce prednisone now to 20 mg daily. Hoping to get down to 10 mg every other day

## 2014-10-22 NOTE — Assessment & Plan Note (Signed)
Acute component seems resolved after doxycycline.

## 2014-10-22 NOTE — Assessment & Plan Note (Signed)
Substantially improved active bronchitis component on steroids.

## 2014-10-22 NOTE — Patient Instructions (Addendum)
Reduce prednisone to 20 mg daily  Script sent for prednisone refill

## 2014-10-22 NOTE — Progress Notes (Signed)
07/15/14- 39 yo M/ F transgender never smoker w/ hx syphilis, HIV  referred by Dr. Zigmund Daniel; Wheezing; Choking sensation; hard to breathe; Syncope episodes after coughing so hard it caused her to faint; unable to eat, will throw up her food, has lost 24lbs due to inability to eat; History of asthma since childhood.  PFT done 10/25/2013- severe obstructive airways disease- with insignificant response to bronchodilator, possible restriction, normal diffusion FVC 3.13/73%, FEV1 1.86/53%, FEV1/FVC 0.59, FEF 25-75 percent 0.95, TLC 83%, DLCO 89%. She is aware of active reflux with easy shortness of breath especially while coughing. Scant white sputum. Little heartburn. Some wheeze but albuterol inhaler is not much help. Wakes at night coughing and choking short of breath if she lies on left side. CT chest 02/07/14-  IMPRESSION: No evidence of pulmonary embolism. Multifocal patchy/ nodular opacities, left lower lobe predominant, suspicious for multifocal pneumonia. Mediastinal/bilateral hilar lymphadenopathy, likely reactive. Electronically Signed  By: Julian Hy M.D.  On: 02/07/2014 11: CXR 05/25/14 IMPRESSION: Bilateral lymphadenopathy in the hila as well as left basilar patchy infiltrate. This raises suspicion for possible sarcoidosis given its relative chronicity. Alternatively this may represent recurrent multifocal pneumonia. Electronically Signed  By: Inez Catalina M.D.  On: 05/25/2014 10:40  07/22/14- 11 yo M/ F transgender never smoker w/ hx syphilis, HIV followed for interstitial infiltrates, cough, GERD, Sarcoid Follows for: hasn't heard from [facility]: lab results; SOB at all times; prod cough at times w/clear mucus; CP with lots of coughing; ACE 06/2014- >100     BNP 18.5     CMET creat 1.21  Hgb 12.9  Troponin 0 Cough is about the same, productive of white sputum. Denies night sweats or adenopathy. Some minor arthralgias in the knees. DME company is coming today to her home  bringing nebulizer machine. Anoro didn't help much.  08/15/14- 7 yo M/ F transgender never smoker followed for Sarcoid w/ hx syphilis, HIV followed for interstitial infiltrates, cough, GERD,  Bronch 08/07/14 + sarcoid with severe airway involvement I had communicated w Dr Zigmund Daniel PCP and Dr Drucilla Schmidt for Dr Megan Salon ID.  Follows [id number] HAVING SOB SINCE LAST VISIT, COUGHING HAS GOTTEN WORST CAN HARDLY HOLD FOOD DOWN HAS LOST 9 LBS SINCE LAST VISIT Discussed sarcoid and prednisone in context of HIV, with UpTo Date patient printout. Again emphasized importance of reflux control. I still think reflux is a likely contributor to the cough problem. I really pressed point that she must be very careful with her antiviral therapy as we start steroids. Discussed steroid side effects.  09/19/14- 65 yo M/ F transgender never smoker followed for Sarcoid w/ hx syphilis, HIV followed for interstitial infiltrates, cough, GERD, complicated by depression  Bronch 08/07/14 + sarcoid with severe airway involvement Prednisone begun 5/19 40 mg daily Being followed on antivirals by Dr Campbell/ ID FOLLOWS FOR: Pt c/o stable sob with exertion.  Pt c/o L sided underarm swelling after starting prednisone.   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 Never before. Cough much better on prednisone. Still DOE doing housework. Much less need for inhalers. CXR 08/12/14 IMPRESSION: Patchy right upper lobe infiltrate. This is new from the prior exam with resolution of previously seen left-sided infiltrate. This likely represents some waxing and waning infiltrates. Electronically Signed  By: Inez Catalina M.D.  On: 08/12/2014 15:00  ROS-see HPI   Negative unless "+" Constitutional:    weight loss, night sweats, fevers, chills, fatigue, lassitude. HEENT:    headaches, difficulty swallowing, tooth/dental problems, sore throat,  sneezing, itching, ear ache, nasal congestion, post nasal drip, snoring CV:     chest pain, orthopnea, PND, swelling in lower extremities, anasarca,                                                         dizziness, palpitations Resp:   +shortness of breath with exertion or at rest.                productive cough,   non-productive cough, coughing up of blood.              change in color of mucus.  wheezing.   Skin:    rash or lesions. GI: + heartburn, indigestion, abdominal pain, nausea, vomiting, GU:  MS:   joint pain, stiffness,  Neuro-     nothing unusual Psych:  change in mood or affect.  depression or anxiety.   memory loss.   10/22/14- 27 yo M/ F transgender never smoker followed for Sarcoid w/ hx syphilis, HIV followed for interstitial infiltrates, cough, GERD, complicated by depression  Bronch 08/07/14 + sarcoid with severe airway involvement Follows Dr Megan Salon ID [place] 6/26 for HIV Doxy helped axillary adenitis-resolved Has continued prednisone at 30 mg daily over the past month. Little cough. Still aware of dyspnea on exertion and weight gain. Chest x-ray images reviewed CXR 09/19/14 IMPRESSION: Interval clearing of right upper lobe infiltrate. No evidence of new abnormality. Electronically Signed  By: Logan Bores  On: 09/19/2014 09:55  OBJ- Physical Exam  +presents as male, + overweight General- Alert, Oriented, Affect-appropriate, Distress- none acute Skin- rash-none, lesions- none, excoriation- none, + tattoos Lymphadenopathy- L axilla puffy, tender Head- atraumatic            Eyes- Gross vision intact, PERRLA, conjunctivae and secretions clear            Ears- Hearing, canals-normal            Nose- Clear, no-Septal dev, mucus, polyps, erosion, perforation             Throat- Mallampati II , mucosa+tongue lightly coated , drainage- none, tonsils- atrophic Neck- flexible , trachea midline, no stridor , thyroid nl, carotid no bruit Chest - symmetrical excursion , unlabored           Heart/CV- RRR , no murmur , no gallop  , no rub, nl s1 s2                            - JVD- none , edema- none, stasis changes- none, varices- none           Lung- Clear, unlabored, cough-none , dullness-none, rub- none           Chest wall-  Abd-  Br/ Gen/ Rectal- Not done, not indicated Extrem- cyanosis- none, clubbing, none, atrophy- none, strength- nl Neuro- grossly intact to observation

## 2014-11-22 ENCOUNTER — Encounter: Payer: Self-pay | Admitting: Internal Medicine

## 2014-11-22 ENCOUNTER — Ambulatory Visit (INDEPENDENT_AMBULATORY_CARE_PROVIDER_SITE_OTHER): Payer: Medicare Other | Admitting: Internal Medicine

## 2014-11-22 VITALS — BP 130/76 | HR 104 | Ht 68.0 in | Wt 279.2 lb

## 2014-11-22 DIAGNOSIS — K219 Gastro-esophageal reflux disease without esophagitis: Secondary | ICD-10-CM

## 2014-11-22 DIAGNOSIS — D860 Sarcoidosis of lung: Secondary | ICD-10-CM

## 2014-11-22 DIAGNOSIS — J440 Chronic obstructive pulmonary disease with acute lower respiratory infection: Secondary | ICD-10-CM | POA: Diagnosis not present

## 2014-11-22 NOTE — Progress Notes (Signed)
07/15/14- 39 yo M/ F transgender never smoker w/ hx syphilis, HIV  referred by Dr. Zigmund Daniel; Wheezing; Choking sensation; hard to breathe; Syncope episodes after coughing so hard it caused her to faint; unable to eat, will throw up her food, has lost 24lbs due to inability to eat; History of asthma since childhood.  PFT done 10/25/2013- severe obstructive airways disease- with insignificant response to bronchodilator, possible restriction, normal diffusion FVC 3.13/73%, FEV1 1.86/53%, FEV1/FVC 0.59, FEF 25-75 percent 0.95, TLC 83%, DLCO 89%. She is aware of active reflux with easy shortness of breath especially while coughing. Scant white sputum. Little heartburn. Some wheeze but albuterol inhaler is not much help. Wakes at night coughing and choking short of breath if she lies on left side. CT chest 02/07/14-  IMPRESSION: No evidence of pulmonary embolism. Multifocal patchy/ nodular opacities, left lower lobe predominant, suspicious for multifocal pneumonia. Mediastinal/bilateral hilar lymphadenopathy, likely reactive. Electronically Signed  By: Julian Hy M.D.  On: 02/07/2014 11: CXR 05/25/14 IMPRESSION: Bilateral lymphadenopathy in the hila as well as left basilar patchy infiltrate. This raises suspicion for possible sarcoidosis given its relative chronicity. Alternatively this may represent recurrent multifocal pneumonia. Electronically Signed  By: Inez Catalina M.D.  On: 05/25/2014 10:40  07/22/14- 71 yo M/ F transgender never smoker w/ hx syphilis, HIV followed for interstitial infiltrates, cough, GERD, Sarcoid Follows for: hasn't heard from [facility]: lab results; SOB at all times; prod cough at times w/clear mucus; CP with lots of coughing; ACE 06/2014- >100     BNP 18.5     CMET creat 1.21  Hgb 12.9  Troponin 0 Cough is about the same, productive of white sputum. Denies night sweats or adenopathy. Some minor arthralgias in the knees. DME company is coming today to her home  bringing nebulizer machine. Anoro didn't help much.  08/15/14- 48 yo M/ F transgender never smoker followed for Sarcoid w/ hx syphilis, HIV followed for interstitial infiltrates, cough, GERD,  Bronch 08/07/14 + sarcoid with severe airway involvement I had communicated w Dr Zigmund Daniel PCP and Dr Drucilla Schmidt for Dr Megan Salon ID.  Follows PXT:GGYIR HAVING SOB SINCE LAST VISIT, COUGHING HAS GOTTEN WORST CAN HARDLY HOLD FOOD DOWN HAS LOST 9 LBS SINCE LAST VISIT Discussed sarcoid and prednisone in context of HIV, with UpTo Date patient printout. Again emphasized importance of reflux control. I still think reflux is a likely contributor to the cough problem. I really pressed point that she must be very careful with her antiviral therapy as we start steroids. Discussed steroid side effects.  09/19/14- 28 yo M/ F transgender never smoker followed for Sarcoid w/ hx syphilis, HIV followed for interstitial infiltrates, cough, GERD, complicated by depression  Bronch 08/07/14 + sarcoid with severe airway involvement Prednisone begun 5/19 40 mg daily Being followed on antivirals by Dr Campbell/ ID FOLLOWS FOR: Pt c/o stable sob with exertion.  Pt c/o L sided underarm swelling after starting prednisone.   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 Never before. Cough much better on prednisone. Still DOE doing housework. Much less need for inhalers. CXR 08/12/14 IMPRESSION: Patchy right upper lobe infiltrate. This is new from the prior exam with resolution of previously seen left-sided infiltrate. This likely represents some waxing and waning infiltrates. Electronically Signed  By: Inez Catalina M.D.  On: 08/12/2014 15:00  ROS-see HPI   Negative unless "+" Constitutional:    weight loss, night sweats, fevers, chills, fatigue, lassitude. HEENT:    headaches, difficulty swallowing, tooth/dental problems, sore throat,  sneezing, itching, ear ache, nasal congestion, post nasal drip, snoring CV:     chest pain, orthopnea, PND, swelling in lower extremities, anasarca,                                                         dizziness, palpitations Resp:   +shortness of breath with exertion or at rest.                productive cough,   non-productive cough, coughing up of blood.              change in color of mucus.  wheezing.   Skin:    rash or lesions. GI: + heartburn, indigestion, abdominal pain, nausea, vomiting, GU:  MS:   joint pain, stiffness,  Neuro-     nothing unusual Psych:  change in mood or affect.  depression or anxiety.   memory loss.   10/22/14- 36 yo M/ F transgender never smoker followed for Sarcoid w/ hx syphilis, HIV followed for interstitial infiltrates, cough, GERD, complicated by depression  Bronch 08/07/14 + sarcoid with severe airway involvement Follows Dr Megan Salon ID [place] 6/26 for HIV Doxy helped axillary adenitis-resolved Has continued prednisone at 30 mg daily over the past month. Little cough. Still aware of dyspnea on exertion and weight gain. Chest x-ray images reviewed CXR 09/19/14 IMPRESSION: Interval clearing of right upper lobe infiltrate. No evidence of new abnormality. Electronically Signed  By: Logan Bores  On: 09/19/2014 09:55  11/22/14-39 yo M/ F transgender never smoker followed for Sarcoid w/ hx syphilis, HIV followed for interstitial infiltrates, cough, GERD, complicated by depression FOLLOWS FOR:Pt states she continues to have SOB and weight gain.  Prednisone 30 mg daily since last here. Much less cough and no acute events. Can reduce to 20 mg                        ROS-see HPI   Negative unless "+" Constitutional:    weight loss, night sweats, fevers, chills, fatigue, lassitude. HEENT:    headaches, difficulty swallowing, tooth/dental problems, sore throat,       sneezing, itching, ear ache, nasal congestion, post nasal drip, snoring CV:    chest pain, orthopnea, PND, swelling in lower extremities, anasarca,                                                        dizziness, palpitations Resp:   + shortness of breath with exertion or at rest.                productive cough,  + non-productive cough, coughing up of blood.              change in color of mucus.  wheezing.   Skin:    rash or lesions. GI:  No-   heartburn, indigestion, abdominal pain, nausea, vomiting,  GU:  MS:   joint pain, stiffness, decreased range of motion, back pain. Neuro-     nothing unusual Psych:  change in mood or affect.  depression or anxiety.   memory loss.   OBJ- Physical Exam  +presents as  male, + overweight General- Alert, Oriented, Affect-appropriate, Distress- none acute Skin- rash-none, lesions- none, excoriation- none, + tattoos Lymphadenopathy- L axilla puffy, tender Head- atraumatic            Eyes- Gross vision intact, PERRLA, conjunctivae and secretions clear            Ears- Hearing, canals-normal            Nose- Clear, no-Septal dev, mucus, polyps, erosion, perforation             Throat- Mallampati II , mucosa+tongue lightly coated , drainage- none, tonsils- atrophic Neck- flexible , trachea midline, no stridor , thyroid nl, carotid no bruit Chest - symmetrical excursion , unlabored           Heart/CV- RRR , no murmur , no gallop  , no rub, nl s1 s2                           - JVD- none , edema- none, stasis changes- none, varices- none           Lung- Clear, unlabored, cough-none , dullness-none, rub- none           Chest wall-  Abd-  Br/ Gen/ Rectal- Not done, not indicated Extrem- cyanosis- none, clubbing, none, atrophy- none, strength- nl Neuro- grossly intact to observation

## 2014-11-22 NOTE — Patient Instructions (Signed)
Reduce prednisone now to 20 mg daily   Please call as needed. Don't let yourself run out and be off prednisone completely.

## 2014-11-23 NOTE — Assessment & Plan Note (Signed)
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

## 2014-11-23 NOTE — Assessment & Plan Note (Signed)
Continued reinforcement of reflux precautions

## 2014-11-23 NOTE — Assessment & Plan Note (Signed)
She has been much more stable. Plan-prednisone 20 mg daily

## 2014-12-12 ENCOUNTER — Other Ambulatory Visit: Payer: Medicare Other

## 2014-12-16 ENCOUNTER — Other Ambulatory Visit: Payer: Medicare Other

## 2014-12-24 ENCOUNTER — Ambulatory Visit: Payer: Medicare Other | Admitting: Internal Medicine

## 2014-12-26 ENCOUNTER — Ambulatory Visit: Payer: Medicare Other | Admitting: Internal Medicine

## 2015-01-25 ENCOUNTER — Other Ambulatory Visit: Payer: Self-pay | Admitting: Internal Medicine

## 2015-03-14 ENCOUNTER — Other Ambulatory Visit: Payer: Self-pay | Admitting: Internal Medicine

## 2015-04-07 ENCOUNTER — Ambulatory Visit: Payer: Medicare Other | Admitting: Internal Medicine

## 2015-04-10 ENCOUNTER — Ambulatory Visit: Payer: Medicare Other | Admitting: Internal Medicine

## 2015-04-11 ENCOUNTER — Other Ambulatory Visit: Payer: Self-pay | Admitting: Internal Medicine

## 2015-05-09 ENCOUNTER — Other Ambulatory Visit: Payer: Self-pay | Admitting: Internal Medicine

## 2015-06-15 ENCOUNTER — Other Ambulatory Visit: Payer: Self-pay | Admitting: Internal Medicine

## 2015-06-26 ENCOUNTER — Telehealth: Payer: Self-pay | Admitting: Internal Medicine

## 2015-06-26 MED ORDER — PREDNISONE 10 MG PO TABS: ORAL_TABLET | ORAL | Status: DC

## 2015-06-26 NOTE — Telephone Encounter (Signed)
Per 11/22/14 OV: Patient Instructions       Reduce prednisone now to 20 mg daily   Please call as needed. Don't let yourself run out and be off prednisone completely.  --  I called spoke with pt. Aware RX has been sent in. Nothing further needed

## 2015-07-17 ENCOUNTER — Ambulatory Visit (INDEPENDENT_AMBULATORY_CARE_PROVIDER_SITE_OTHER)
Admission: RE | Admit: 2015-07-17 | Discharge: 2015-07-17 | Disposition: A | Discharge status: Home / Self Care | Payer: Medicare Other | Source: Ambulatory Visit | Attending: Internal Medicine | Admitting: Internal Medicine

## 2015-07-17 ENCOUNTER — Ambulatory Visit (INDEPENDENT_AMBULATORY_CARE_PROVIDER_SITE_OTHER): Payer: Medicare Other | Admitting: Internal Medicine

## 2015-07-17 ENCOUNTER — Other Ambulatory Visit (INDEPENDENT_AMBULATORY_CARE_PROVIDER_SITE_OTHER): Payer: Medicare Other

## 2015-07-17 ENCOUNTER — Encounter: Payer: Self-pay | Admitting: Internal Medicine

## 2015-07-17 VITALS — BP 124/80 | HR 101 | Ht 68.0 in | Wt 334.8 lb

## 2015-07-17 DIAGNOSIS — D860 Sarcoidosis of lung: Secondary | ICD-10-CM

## 2015-07-17 DIAGNOSIS — D869 Sarcoidosis, unspecified: Secondary | ICD-10-CM | POA: Diagnosis not present

## 2015-07-17 LAB — CBC WITH DIFFERENTIAL/PLATELET
BASOS ABS: 0 10*3/uL (ref 0.0–0.1)
Basophils Relative: 0.3 % (ref 0.0–3.0)
Eosinophils Absolute: 0 10*3/uL (ref 0.0–0.7)
Eosinophils Relative: 0.2 % (ref 0.0–5.0)
HEMATOCRIT: 45.9 % (ref 39.0–52.0)
Hemoglobin: 15.2 g/dL (ref 13.0–17.0)
LYMPHS PCT: 16.3 % (ref 12.0–46.0)
Lymphs Abs: 1.8 10*3/uL (ref 0.7–4.0)
MCHC: 33.1 g/dL (ref 30.0–36.0)
MCV: 90 fl (ref 78.0–100.0)
MONOS PCT: 4.6 % (ref 3.0–12.0)
Monocytes Absolute: 0.5 10*3/uL (ref 0.1–1.0)
Neutro Abs: 8.5 10*3/uL — ABNORMAL HIGH (ref 1.4–7.7)
Neutrophils Relative %: 78.6 % — ABNORMAL HIGH (ref 43.0–77.0)
Platelets: 217 10*3/uL (ref 150.0–400.0)
RBC: 5.1 Mil/uL (ref 4.22–5.81)
RDW: 15.2 % (ref 11.5–15.5)
WBC: 10.9 10*3/uL — ABNORMAL HIGH (ref 4.0–10.5)

## 2015-07-17 MED ORDER — PREDNISONE 10 MG PO TABS: ORAL_TABLET | ORAL | Status: DC

## 2015-07-17 NOTE — Progress Notes (Signed)
07/15/14- 40 yo M/ F transgender never smoker w/ hx syphilis, HIV  referred by Dr. Zigmund Daniel; Wheezing; Choking sensation; hard to breathe; Syncope episodes after coughing so hard it caused her to faint; unable to eat, will throw up her food, has lost 24lbs due to inability to eat; History of asthma since childhood.  PFT done 10/25/2013- severe obstructive airways disease- with insignificant response to bronchodilator, possible restriction, normal diffusion FVC 3.13/73%, FEV1 1.86/53%, FEV1/FVC 0.59, FEF 25-75 percent 0.95, TLC 83%, DLCO 89%. She is aware of active reflux with easy shortness of breath especially while coughing. Scant white sputum. Little heartburn. Some wheeze but albuterol inhaler is not much help. Wakes at night coughing and choking short of breath if she lies on left side. CT chest 02/07/14-  IMPRESSION: No evidence of pulmonary embolism. Multifocal patchy/ nodular opacities, left lower lobe predominant, suspicious for multifocal pneumonia. Mediastinal/bilateral hilar lymphadenopathy, likely reactive. Electronically Signed  By: Julian Hy M.D.  On: 02/07/2014 11: CXR 05/25/14 IMPRESSION: Bilateral lymphadenopathy in the hila as well as left basilar patchy infiltrate. This raises suspicion for possible sarcoidosis given its relative chronicity. Alternatively this may represent recurrent multifocal pneumonia. Electronically Signed  By: Inez Catalina M.D.  On: 05/25/2014 10:40  07/22/14- 71 yo M/ F transgender never smoker w/ hx syphilis, HIV followed for interstitial infiltrates, cough, GERD, Sarcoid Follows for: hasn't heard from [facility]: lab results; SOB at all times; prod cough at times w/clear mucus; CP with lots of coughing; ACE 06/2014- >100     BNP 18.5     CMET creat 1.21  Hgb 12.9  Troponin 0 Cough is about the same, productive of white sputum. Denies night sweats or adenopathy. Some minor arthralgias in the knees. DME company is coming today to her home  bringing nebulizer machine. Anoro didn't help much.  08/15/14- 48 yo M/ F transgender never smoker followed for Sarcoid w/ hx syphilis, HIV followed for interstitial infiltrates, cough, GERD,  Bronch 08/07/14 + sarcoid with severe airway involvement I had communicated w Dr Zigmund Daniel PCP and Dr Drucilla Schmidt for Dr Megan Salon ID.  Follows PXT:GGYIR HAVING SOB SINCE LAST VISIT, COUGHING HAS GOTTEN WORST CAN HARDLY HOLD FOOD DOWN HAS LOST 9 LBS SINCE LAST VISIT Discussed sarcoid and prednisone in context of HIV, with UpTo Date patient printout. Again emphasized importance of reflux control. I still think reflux is a likely contributor to the cough problem. I really pressed point that she must be very careful with her antiviral therapy as we start steroids. Discussed steroid side effects.  09/19/14- 28 yo M/ F transgender never smoker followed for Sarcoid w/ hx syphilis, HIV followed for interstitial infiltrates, cough, GERD, complicated by depression  Bronch 08/07/14 + sarcoid with severe airway involvement Prednisone begun 5/19 40 mg daily Being followed on antivirals by Dr Campbell/ ID FOLLOWS FOR: Pt c/o stable sob with exertion.  Pt c/o L sided underarm swelling after starting prednisone.   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 Never before. Cough much better on prednisone. Still DOE doing housework. Much less need for inhalers. CXR 08/12/14 IMPRESSION: Patchy right upper lobe infiltrate. This is new from the prior exam with resolution of previously seen left-sided infiltrate. This likely represents some waxing and waning infiltrates. Electronically Signed  By: Inez Catalina M.D.  On: 08/12/2014 15:00  ROS-see HPI   Negative unless "+" Constitutional:    weight loss, night sweats, fevers, chills, fatigue, lassitude. HEENT:    headaches, difficulty swallowing, tooth/dental problems, sore throat,  sneezing, itching, ear ache, nasal congestion, post nasal drip, snoring CV:     chest pain, orthopnea, PND, swelling in lower extremities, anasarca,                                                         dizziness, palpitations Resp:   +shortness of breath with exertion or at rest.                productive cough,   non-productive cough, coughing up of blood.              change in color of mucus.  wheezing.   Skin:    rash or lesions. GI: + heartburn, indigestion, abdominal pain, nausea, vomiting, GU:  MS:   joint pain, stiffness,  Neuro-     nothing unusual Psych:  change in mood or affect.  depression or anxiety.   memory loss.   10/22/14- 49 yo M/ F transgender never smoker followed for Sarcoid w/ hx syphilis, HIV followed for interstitial infiltrates, cough, GERD, complicated by depression  Bronch 08/07/14 + sarcoid with severe airway involvement Follows Dr Megan Salon ID White 6/26 for HIV Doxy helped axillary adenitis-resolved Has continued prednisone at 30 mg daily over the past month. Little cough. Still aware of dyspnea on exertion and weight gain. Chest x-ray images reviewed CXR 09/19/14 IMPRESSION: Interval clearing of right upper lobe infiltrate. No evidence of new abnormality. Electronically Signed  By: Logan Bores  On: 09/19/2014 09:55  11/22/14-39 yo M/ F transgender never smoker followed for Sarcoid w/ hx syphilis, HIV followed for interstitial infiltrates, cough, GERD, complicated by depression FOLLOWS FOR:Pt states she continues to have SOB and weight gain.  Prednisone 30 mg daily since last here. Much less cough and no acute events. Can reduce to 20 mg  07/17/2015-40 year-old male/male transgender never smoker followed for Sarcoid with history syphilis, HIV, followed for interstitial infiltrates, cough, GERD/aspiration pneumonia, complicated by depression FOLLOWS FOR: Pt states she went back up to 4 tablets QD of Prednisone 10 mg due to cough and wheezing. Pt feels better at 40mg  QD. Pt also wants to get handicap placard as well. Weight is now  334 pounds Still dyspnea on exertion and dry cough but much better on prednisone. This is her first return since last summer. She says she is making an appointment to follow-up with infectious disease as well.                        ROS-see HPI   Negative unless "+" Constitutional:    weight loss, night sweats, fevers, chills, fatigue, lassitude. HEENT:    headaches, difficulty swallowing, tooth/dental problems, sore throat,       sneezing, itching, ear ache, nasal congestion, post nasal drip, snoring CV:    chest pain, orthopnea, PND, swelling in lower extremities, anasarca,                                                       dizziness, palpitations Resp:   + shortness of breath with exertion or at rest.  productive cough,  + non-productive cough, coughing up of blood.              change in color of mucus.  wheezing.   Skin:    rash or lesions. GI:  No-   heartburn, indigestion, abdominal pain, nausea, vomiting,  GU:  MS:   joint pain, stiffness, decreased range of motion, back pain. Neuro-     nothing unusual Psych:  change in mood or affect.  depression or anxiety.   memory loss.   OBJ- Physical Exam  +presents as male, + obese General- Alert, Oriented, Affect-appropriate, Distress- none acute Skin- rash-none, lesions- none, excoriation- none, + tattoos Lymphadenopathy- L axilla puffy, tender Head- atraumatic            Eyes- Gross vision intact, PERRLA, conjunctivae and secretions clear            Ears- Hearing, canals-normal            Nose- Clear, no-Septal dev, mucus, polyps, erosion, perforation             Throat- Mallampati II , mucosa+tongue lightly coated , drainage- none, tonsils- atrophic Neck- flexible , trachea midline, no stridor , thyroid nl, carotid no bruit Chest - symmetrical excursion , unlabored           Heart/CV- RRR , no murmur , no gallop  , no rub, nl s1 s2                           - JVD- none , edema- none, stasis changes- none,  varices- none           Lung- Clear, unlabored, cough-none , dullness-none, rub- none           Chest wall-  Abd-  Br/ Gen/ Rectal- Not done, not indicated Extrem- cyanosis- none, clubbing, none, atrophy- none, strength- nl Neuro- grossly intact to observation

## 2015-07-17 NOTE — Patient Instructions (Addendum)
Order- office spirometry   Dx sarcoid  Order- CXR    Order- lab- ACE level,   BMET, Hepatic function panel, CBC w diff  Handicapped parking  Prednisone refill sent

## 2015-07-17 NOTE — Assessment & Plan Note (Signed)
We need labs to update status. I began a discussion of methotrexate trial as alternative to prednisone for consideration at next visit.

## 2015-07-17 NOTE — Assessment & Plan Note (Signed)
Aggravated by maintenance prednisone. I discussed need to push to the lowest necessary dose. Steroid talk done.

## 2015-07-18 ENCOUNTER — Other Ambulatory Visit: Payer: Self-pay | Admitting: Internal Medicine

## 2015-07-18 LAB — HEPATIC FUNCTION PANEL
ALBUMIN: 3.9 g/dL (ref 3.5–5.2)
ALT: 16 U/L (ref 0–53)
AST: 32 U/L (ref 0–37)
Alkaline Phosphatase: 73 U/L (ref 39–117)
Bilirubin, Direct: 0.1 mg/dL (ref 0.0–0.3)
TOTAL PROTEIN: 8.5 g/dL — AB (ref 6.0–8.3)
Total Bilirubin: 0.6 mg/dL (ref 0.2–1.2)

## 2015-07-18 LAB — BASIC METABOLIC PANEL
BUN: 13 mg/dL (ref 6–23)
CALCIUM: 9.6 mg/dL (ref 8.4–10.5)
CO2: 24 meq/L (ref 19–32)
CREATININE: 1.29 mg/dL (ref 0.40–1.50)
Chloride: 102 mEq/L (ref 96–112)
GFR: 78.84 mL/min (ref >60.00)
GLUCOSE: 117 mg/dL — AB (ref 70–99)
Potassium: 4.5 mEq/L (ref 3.5–5.1)
Sodium: 137 mEq/L (ref 135–145)

## 2015-07-18 LAB — ANGIOTENSIN CONVERTING ENZYME: Angiotensin-Converting Enzyme: >100 U/L — ABNORMAL HIGH (ref 8–52)

## 2015-07-18 NOTE — Telephone Encounter (Signed)
Error

## 2015-08-13 ENCOUNTER — Other Ambulatory Visit: Payer: Self-pay | Admitting: Internal Medicine

## 2015-08-27 ENCOUNTER — Other Ambulatory Visit: Payer: Self-pay | Admitting: *Deleted

## 2015-08-27 DIAGNOSIS — B20 Human immunodeficiency virus [HIV] disease: Secondary | ICD-10-CM

## 2015-08-27 MED ORDER — DOLUTEGRAVIR SODIUM 50 MG PO TABS: 50.0000 mg | ORAL_TABLET | Freq: Every day | ORAL | Status: DC

## 2015-09-01 ENCOUNTER — Other Ambulatory Visit (HOSPITAL_COMMUNITY)
Admission: RE | Admit: 2015-09-01 | Discharge: 2015-09-01 | Disposition: A | Discharge status: Home / Self Care | Payer: Medicare Other | Source: Ambulatory Visit | Attending: Internal Medicine | Admitting: Internal Medicine

## 2015-09-01 DIAGNOSIS — Z113 Encounter for screening for infections with a predominantly sexual mode of transmission: Secondary | ICD-10-CM | POA: Diagnosis not present

## 2015-09-04 ENCOUNTER — Other Ambulatory Visit: Payer: Medicare Other

## 2015-09-04 DIAGNOSIS — Z79899 Other long term (current) drug therapy: Secondary | ICD-10-CM

## 2015-09-04 DIAGNOSIS — B20 Human immunodeficiency virus [HIV] disease: Secondary | ICD-10-CM

## 2015-09-04 DIAGNOSIS — Z113 Encounter for screening for infections with a predominantly sexual mode of transmission: Secondary | ICD-10-CM

## 2015-09-05 ENCOUNTER — Other Ambulatory Visit: Payer: Self-pay | Admitting: Internal Medicine

## 2015-09-05 LAB — HIV-1 RNA QUANT-NO REFLEX-BLD

## 2015-09-05 LAB — T-HELPER CELL (CD4) - (RCID CLINIC ONLY)
CD4 % Helper T Cell: 24 % — ABNORMAL LOW (ref 33–55)
CD4 T Cell Abs: 450 /uL (ref 400–2700)

## 2015-09-05 NOTE — Addendum Note (Signed)
Addended by: Dolan Amen D on: 09/05/2015 12:27 PM   Modules accepted: Orders

## 2015-09-06 LAB — LIPID PANEL
Cholesterol: 154 mg/dL (ref 125–200)
HDL: 36 mg/dL — ABNORMAL LOW (ref >=40)
LDL CALC: 90 mg/dL (ref <130)
Total CHOL/HDL Ratio: 4.3 Ratio (ref <=5.0)
Triglycerides: 138 mg/dL (ref <150)
VLDL: 28 mg/dL (ref <30)

## 2015-09-06 LAB — RPR TITER: RPR Titer: 1:64 {titer} — AB

## 2015-09-06 LAB — RPR: RPR Ser Ql: REACTIVE — AB

## 2015-09-08 LAB — FLUORESCENT TREPONEMAL AB(FTA)-IGG-BLD: Fluorescent Treponemal ABS: REACTIVE — AB

## 2015-09-10 LAB — URINE CYTOLOGY ANCILLARY ONLY
CHLAMYDIA, DNA PROBE: NEGATIVE
Neisseria Gonorrhea: NEGATIVE

## 2015-09-12 ENCOUNTER — Telehealth: Payer: Self-pay | Admitting: *Deleted

## 2015-09-12 NOTE — Telephone Encounter (Signed)
Patient called with concerns about her glucose. She has been having dry mouth and frequent urination. Last check 06/2015 it was 117, however patient said she was not fasting at that time. Asked if she had a PCP (previously seen at [facility]).  Advised her I would call and get her scheduled with a new provider there and she was open to this. She has an upcoming appt with [name] on 09/25/15 and I  told her I would advise MD of her concerns.

## 2015-09-16 ENCOUNTER — Encounter (HOSPITAL_COMMUNITY): Payer: Self-pay | Admitting: *Deleted

## 2015-09-16 ENCOUNTER — Emergency Department (HOSPITAL_COMMUNITY)
Admission: EM | Admit: 2015-09-16 | Discharge: 2015-09-17 | Disposition: A | Discharge status: Home / Self Care | Payer: Medicare Other | Attending: Emergency Medicine | Admitting: Emergency Medicine

## 2015-09-16 DIAGNOSIS — F329 Major depressive disorder, single episode, unspecified: Secondary | ICD-10-CM | POA: Diagnosis not present

## 2015-09-16 DIAGNOSIS — R350 Frequency of micturition: Secondary | ICD-10-CM | POA: Diagnosis present

## 2015-09-16 DIAGNOSIS — Z79899 Other long term (current) drug therapy: Secondary | ICD-10-CM | POA: Insufficient documentation

## 2015-09-16 DIAGNOSIS — E118 Type 2 diabetes mellitus with unspecified complications: Secondary | ICD-10-CM | POA: Diagnosis not present

## 2015-09-16 DIAGNOSIS — E1169 Type 2 diabetes mellitus with other specified complication: Secondary | ICD-10-CM | POA: Diagnosis not present

## 2015-09-16 LAB — I-STAT CHEM 8, ED
BUN: 16 mg/dL (ref 6–20)
Calcium, Ion: 1.21 mmol/L (ref 1.12–1.23)
Chloride: 97 mmol/L — ABNORMAL LOW (ref 101–111)
Creatinine, Ser: 1 mg/dL (ref 0.61–1.24)
GLUCOSE: 443 mg/dL — AB (ref 65–99)
HCT: 46 % (ref 39.0–52.0)
HEMOGLOBIN: 15.6 g/dL (ref 13.0–17.0)
POTASSIUM: 4.2 mmol/L (ref 3.5–5.1)
Sodium: 135 mmol/L (ref 135–145)
TCO2: 23 mmol/L (ref 0–100)

## 2015-09-16 LAB — CBC
HCT: 46 % (ref 39.0–52.0)
Hemoglobin: 15.8 g/dL (ref 13.0–17.0)
MCH: 30.3 pg (ref 26.0–34.0)
MCHC: 34.3 g/dL (ref 30.0–36.0)
MCV: 88.1 fL (ref 78.0–100.0)
Platelets: 225 10*3/uL (ref 150–400)
RBC: 5.22 MIL/uL (ref 4.22–5.81)
RDW: 14.5 % (ref 11.5–15.5)
WBC: 9.9 10*3/uL (ref 4.0–10.5)

## 2015-09-16 LAB — URINE MICROSCOPIC-ADD ON

## 2015-09-16 LAB — BLOOD GAS, VENOUS
Acid-base deficit: 1.4 mmol/L (ref 0.0–2.0)
Bicarbonate: 23.3 meq/L (ref 20.0–24.0)
Drawn by: 295031
FIO2: 0.21
O2 Saturation: 64.9 %
Patient temperature: 98.6
TCO2: 20.3 mmol/L (ref 0–100)
pCO2, Ven: 41.4 mmHg — ABNORMAL LOW (ref 45.0–50.0)
pH, Ven: 7.369 — ABNORMAL HIGH (ref 7.250–7.300)
pO2, Ven: 35.5 mmHg (ref 31.0–45.0)

## 2015-09-16 LAB — BASIC METABOLIC PANEL
ANION GAP: 13 (ref 5–15)
BUN: 17 mg/dL (ref 6–20)
CALCIUM: 9.5 mg/dL (ref 8.9–10.3)
CO2: 21 mmol/L — ABNORMAL LOW (ref 22–32)
Chloride: 95 mmol/L — ABNORMAL LOW (ref 101–111)
Creatinine, Ser: 1.36 mg/dL — ABNORMAL HIGH (ref 0.61–1.24)
Glucose, Bld: 551 mg/dL (ref 65–99)
POTASSIUM: 4.6 mmol/L (ref 3.5–5.1)
SODIUM: 129 mmol/L — AB (ref 135–145)

## 2015-09-16 LAB — URINALYSIS, ROUTINE W REFLEX MICROSCOPIC
Bilirubin Urine: NEGATIVE
KETONES UR: 40 mg/dL — AB
LEUKOCYTES UA: NEGATIVE
NITRITE: NEGATIVE
PH: 5 (ref 5.0–8.0)
PROTEIN: NEGATIVE mg/dL
SPECIFIC GRAVITY, URINE: 1.043 — AB (ref 1.005–1.030)

## 2015-09-16 LAB — CBG MONITORING, ED: Glucose-Capillary: 497 mg/dL — ABNORMAL HIGH (ref 65–99)

## 2015-09-16 MED ORDER — METFORMIN HCL 500 MG PO TABS
500.0000 mg | ORAL_TABLET | Freq: Once | ORAL | Status: AC
  Administered 2015-09-16: 500 mg via ORAL
  Filled 2015-09-16 (×3): qty 1

## 2015-09-16 MED ORDER — SODIUM CHLORIDE 0.9 % IV BOLUS (SEPSIS)
2000.0000 mL | Freq: Once | INTRAVENOUS | Status: AC
  Administered 2015-09-16: 2000 mL via INTRAVENOUS

## 2015-09-16 MED ORDER — PENICILLIN V POTASSIUM 500 MG PO TABS
500.0000 mg | ORAL_TABLET | Freq: Three times a day (TID) | ORAL | Status: DC
Start: 2015-09-16 — End: 2015-09-25

## 2015-09-16 MED ORDER — KETOROLAC TROMETHAMINE 30 MG/ML IJ SOLN
15.0000 mg | Freq: Once | INTRAMUSCULAR | Status: AC
  Administered 2015-09-16: 15 mg via INTRAVENOUS
  Filled 2015-09-16: qty 1

## 2015-09-16 MED ORDER — METFORMIN HCL 500 MG PO TABS: 500.0000 mg | ORAL_TABLET | Freq: Two times a day (BID) | ORAL | Status: DC

## 2015-09-16 MED ORDER — PENICILLIN V POTASSIUM 500 MG PO TABS
500.0000 mg | ORAL_TABLET | Freq: Once | ORAL | Status: AC
  Administered 2015-09-16: 500 mg via ORAL
  Filled 2015-09-16: qty 1

## 2015-09-16 NOTE — ED Notes (Addendum)
Pt reports urinary frequency and dry mouth x 2 weeks.  Denies hx of DM but it runs in the family.  Pt also reports dizziness.

## 2015-09-16 NOTE — Progress Notes (Signed)
EDP reached out to CSW requesting consult for finding patient PCP. CSW informed EDP that this consult would be appropriate for Nurse CM, he states he will put in a consult for the case manager.  Willette Brace [id number] ED CSW 09/16/2015 11:01 PM

## 2015-09-16 NOTE — ED Notes (Signed)
Patient changed into gown.

## 2015-09-16 NOTE — ED Provider Notes (Signed)
CSN: [id number]     Arrival date & time 09/16/15  71 History   First MD Initiated Contact with Patient 09/16/15 1704     Chief Complaint  Patient presents with  . Urinary Frequency  . dry mouth      (Consider location/radiation/quality/duration/timing/severity/associated sxs/prior Treatment) Patient is a 40 y.o. male presenting with frequency.  Urinary Frequency This is a new problem. The current episode started more than 1 week ago. The problem occurs constantly. The problem has not changed since onset.Pertinent negatives include no chest pain, no headaches and no shortness of breath. Nothing aggravates the symptoms. Nothing relieves the symptoms.    Past Medical History  Diagnosis Date  . HIV disease ([place])   . Immune deficiency disorder ([place])   . Depression   . Allergy    Past Surgical History  Procedure Laterality Date  . Silicone injections Bilateral y-18    Breasts and hips- black market  . Video bronchoscopy Bilateral 08/06/2014    Procedure: VIDEO BRONCHOSCOPY WITH FLUORO;  Surgeon: [name], MD;  Location: [facility];  Service: Endoscopy;  Laterality: Bilateral;   Family History  Problem Relation Age of Onset  . Diabetes Maternal Grandmother   . Hypertension Maternal Grandmother    Social History  Substance Use Topics  . Smoking status: Never Smoker   . Smokeless tobacco: Never Used  . Alcohol Use: No    Review of Systems  Constitutional: Negative for fever and chills.  Eyes: Negative for pain.  Respiratory: Negative for shortness of breath.   Cardiovascular: Negative for chest pain.  Endocrine: Positive for polydipsia and polyuria.  Genitourinary: Positive for frequency.  Musculoskeletal: Negative for myalgias and back pain.  Neurological: Negative for headaches.  All other systems reviewed and are negative.     Allergies  Review of patient's allergies indicates no known allergies.  Home Medications   Prior to Admission medications    Medication Sig Start Date End Date Taking? Authorizing Provider  dolutegravir (TIVICAY) 50 MG tablet Take 1 tablet (50 mg total) by mouth daily. 08/27/15  Yes Michel Bickers, MD  emtricitabine-tenofovir AF (DESCOVY) 200-25 MG per tablet Take 1 tablet by mouth daily. 09/24/14  Yes Michel Bickers, MD  pantoprazole (PROTONIX) 20 MG tablet TAKE 1 TABLET(20 MG) BY MOUTH DAILY 08/13/15  Yes Deneise Lever, MD  predniSONE (DELTASONE) 10 MG tablet TAKE 2 TABLETS BY MOUTH DAILY OR AS DIRECTED Patient taking differently: Take 20 mg by mouth daily.  07/17/15  Yes Deneise Lever, MD  albuterol (PROVENTIL) (2.5 MG/3ML) 0.083% nebulizer solution Take 3 mLs (2.5 mg total) by nebulization every 4 (four) hours as needed for wheezing or shortness of breath. 07/15/14   Deneise Lever, MD  Nebulizers (COMPRESSOR NEBULIZER) MISC 1 Device by Does not apply route every 6 (six) hours as needed. 07/15/14   Deneise Lever, MD   BP 125/98 mmHg  Pulse 114  Temp(Src) 98.2 F (36.8 C) (Oral)  Resp 20  Ht 5\' 9"  (1.753 m)  Wt 327 lb (148.326 kg)  BMI 48.27 kg/m2  SpO2 96% Physical Exam  Constitutional: He appears well-developed and well-nourished.  HENT:  Head: Normocephalic and atraumatic.  Neck: Normal range of motion.  Cardiovascular: Regular rhythm.  Tachycardia present.   Pulmonary/Chest: Effort normal. No respiratory distress.  Abdominal: He exhibits no distension.  Musculoskeletal: Normal range of motion.  Neurological: He is alert.  Nursing note and vitals reviewed.   ED Course  Procedures (including critical care time) Labs  Review Labs Reviewed  BASIC METABOLIC PANEL - Abnormal; Notable for the following:    Sodium 129 (*)    Chloride 95 (*)    CO2 21 (*)    Glucose, Bld 551 (*)    Creatinine, Ser 1.36 (*)    All other components within normal limits  BLOOD GAS, VENOUS - Abnormal; Notable for the following:    pH, Ven 7.369 (*)    pCO2, Ven 41.4 (*)    All other components within normal limits   CBG MONITORING, ED - Abnormal; Notable for the following:    Glucose-Capillary 497 (*)    All other components within normal limits  CBC  URINALYSIS, ROUTINE W REFLEX MICROSCOPIC (NOT AT [facility])    Imaging Review No results found. I have personally reviewed and evaluated these images and lab results as part of my medical decision-making.   EKG Interpretation None      MDM   Final diagnoses:  None   Likely new onset DM, will evaluate appropriately. Doubt need for admission at this time, has close PCP fu, will likely start on metformin.  Patient blood sugar did not improve significantly, however no gap and no longer acidotic. Tolerating PO medications. Planned for admission to start on insulin therapy, however this was against the patient's wishes. i discssed that if this got worse they could be very ill and they stated they would come back if that was the case. I also discussed getting closer PCP follow up and will ask that care management help with that as well. Patient not made to leave AMA.  Penicillin for tooth infection.   New Prescriptions: Discharge Medication List as of 09/16/2015 11:04 PM    START taking these medications   Details  metFORMIN (GLUCOPHAGE) 500 MG tablet Take 1 tablet (500 mg total) by mouth 2 (two) times daily with a meal., Starting 09/16/2015, Until Discontinued, Print    penicillin v potassium (VEETID) 500 MG tablet Take 1 tablet (500 mg total) by mouth 3 (three) times daily., Starting 09/16/2015, Until Discontinued, Print         I have personally and contemperaneously reviewed labs and imaging and used in my decision making as above.   A medical screening exam was performed and I feel the patient has had an appropriate workup for their chief complaint at this time and likelihood of emergent condition existing is low and thus workup can continue on an outpatient basis.. Their vital signs are stable. They have been counseled on decision, discharge,  follow up and which symptoms necessitate immediate return to the emergency department.  They verbally stated understanding and agreement with plan and discharged in stable condition.      Merrily Pew, MD 09/17/15 1122

## 2015-09-17 ENCOUNTER — Telehealth: Payer: Self-pay | Admitting: General Practice

## 2015-09-17 ENCOUNTER — Telehealth: Payer: Self-pay | Admitting: *Deleted

## 2015-09-17 ENCOUNTER — Other Ambulatory Visit: Payer: Self-pay | Admitting: Internal Medicine

## 2015-09-17 NOTE — Telephone Encounter (Signed)
She will need to see a primary care provider to obtain her insulin orders. She was seen last year in the sickle cell clinic.

## 2015-09-17 NOTE — ED Notes (Signed)
Patient d/c'd self care.  F/U and medications reviewed.  Patient verbalized understanding. 

## 2015-09-17 NOTE — Telephone Encounter (Signed)
Patient verbalized understanding. Thanks!

## 2015-09-17 NOTE — Telephone Encounter (Signed)
Patient called to see if [name] would be the physician to start her on insulin. Patietn states she was recently in the ED and was diagnosed with diabetes. She has started her metformin, but is asking for insulin. RN advised patient of orange card application options, pharmacy options for insulin.  Patient will can to see if her PCP appointment can be moved up. Patient confirmed her appointment for 6/29. Landis Gandy, RN

## 2015-09-20 ENCOUNTER — Other Ambulatory Visit: Payer: Self-pay | Admitting: Internal Medicine

## 2015-09-23 ENCOUNTER — Other Ambulatory Visit: Payer: Self-pay

## 2015-09-23 ENCOUNTER — Telehealth: Payer: Self-pay

## 2015-09-23 DIAGNOSIS — B20 Human immunodeficiency virus [HIV] disease: Secondary | ICD-10-CM

## 2015-09-23 MED ORDER — EMTRICITABINE-TENOFOVIR AF 200-25 MG PO TABS: 1.0000 | ORAL_TABLET | Freq: Every day | ORAL | Status: DC

## 2015-09-23 MED ORDER — DOLUTEGRAVIR SODIUM 50 MG PO TABS: 50.0000 mg | ORAL_TABLET | Freq: Every day | ORAL | Status: DC

## 2015-09-23 NOTE — Telephone Encounter (Signed)
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 If not already treated at the [facility] department she needs benzathine penicillin 2.4 million units IM weekly 3.

## 2015-09-23 NOTE — Addendum Note (Signed)
Addended by: Laverle Patter on: 09/23/2015 03:36 PM   Modules accepted: Orders, Medications

## 2015-09-23 NOTE — Telephone Encounter (Signed)
Dr Megan Salon On 09-04-15 this patient had a reactive RPR with a titer of 1: 64.  I do not see any documentation of treatment.  Please advise.     Laverle Patter, RN

## 2015-09-23 NOTE — Telephone Encounter (Signed)
Pharmacist stated script keeps getting denied by our office .   Patient should be taking Tivicay.   Refill request contained note: patient needs office visit.   Tivicay script re-entered and note removed.    Laverle Patter, RN

## 2015-09-23 NOTE — Telephone Encounter (Signed)
Patient informed of positive syphilis results.  She is coming for visit on Thursday and will start treatment then.   Laverle Patter, RN .

## 2015-09-25 ENCOUNTER — Ambulatory Visit (INDEPENDENT_AMBULATORY_CARE_PROVIDER_SITE_OTHER): Payer: Medicare Other | Admitting: Internal Medicine

## 2015-09-25 ENCOUNTER — Encounter: Payer: Self-pay | Admitting: Internal Medicine

## 2015-09-25 VITALS — BP 145/99 | HR 89 | Temp 98.1°F | Ht 69.0 in | Wt 308.0 lb

## 2015-09-25 DIAGNOSIS — B20 Human immunodeficiency virus [HIV] disease: Secondary | ICD-10-CM

## 2015-09-25 DIAGNOSIS — R739 Hyperglycemia, unspecified: Secondary | ICD-10-CM | POA: Diagnosis present

## 2015-09-25 DIAGNOSIS — A528 Late syphilis, latent: Secondary | ICD-10-CM | POA: Diagnosis not present

## 2015-09-25 DIAGNOSIS — A539 Syphilis, unspecified: Secondary | ICD-10-CM | POA: Diagnosis not present

## 2015-09-25 DIAGNOSIS — Z79899 Other long term (current) drug therapy: Secondary | ICD-10-CM | POA: Diagnosis not present

## 2015-09-25 LAB — BASIC METABOLIC PANEL
Anion gap: 9 (ref 5–15)
BUN: 8 mg/dL (ref 6–20)
CALCIUM: 9 mg/dL (ref 8.9–10.3)
CHLORIDE: 98 mmol/L — AB (ref 101–111)
CO2: 25 mmol/L (ref 22–32)
CREATININE: 1.06 mg/dL (ref 0.61–1.24)
Glucose, Bld: 424 mg/dL — ABNORMAL HIGH (ref 65–99)
Potassium: 4.9 mmol/L (ref 3.5–5.1)
SODIUM: 132 mmol/L — AB (ref 135–145)

## 2015-09-25 MED ORDER — PENICILLIN G BENZATHINE 1200000 UNIT/2ML IM SUSP
1.2000 10*6.[IU] | Freq: Once | INTRAMUSCULAR | Status: AC
  Administered 2015-09-25: 1.2 10*6.[IU] via INTRAMUSCULAR

## 2015-09-25 NOTE — Assessment & Plan Note (Signed)
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 We will start benzathine penicillin 2.56million units IM weekly x3. We will then follow serial RPRs.

## 2015-09-25 NOTE — Assessment & Plan Note (Addendum)
She has developed hyperglycemia in the setting of chronic prednisone use for sarcoidosis. We will try to keep her well hydrated and to get her into see a primary care provider as soon as possible. I will also discuss the possibility of discontinuing prednisone and using methotrexate for her sarcoidosis with Dr. Keturah Barre.

## 2015-09-25 NOTE — Progress Notes (Signed)
Patient Active Problem List   Diagnosis Date Noted  . Hyperglycemia 09/25/2015    Priority: High  . Sarcoidosis of lung ([place]) 01/31/2014    Priority: High  . Human immunodeficiency virus (HIV) disease ([place]) 04/26/2006    Priority: High  . DEPRESSION 04/26/2006    Priority: Medium  . 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 GERD (gastroesophageal reflux disease) 08/15/2014  . Pulmonary infiltrates 07/25/2014  . Bronchitis, chronic obstructive w acute bronchitis ([place]) 07/16/2014  . SOB (shortness of breath) 02/04/2014  . Syncope 02/04/2014  . Immunization due 01/31/2014  . Cough 01/31/2014  . Tachycardia 01/31/2014  . Acute bronchitis 10/03/2013  . Generalized swelling, mass, or lump of abdomen or pelvis 09/21/2013  . Other allergic rhinitis 09/21/2013  . Essential hypertension 09/21/2013  . Preventative health care 09/21/2013  . Depression 09/21/2013  . Transgendered 09/21/2013  . Wheezing 08/07/2013  . Peripheral neuropathy ([place]) 08/31/2010  . MOLE 10/14/2009  . SKIN RASH 10/14/2009  . MORBID OBESITY 04/22/2009  . 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 ALLERGIC RHINITIS 04/26/2006  . Hilar adenopathy 04/26/2006    Patient's Medications  New Prescriptions   No medications on file  Previous Medications   ALBUTEROL (PROVENTIL) (2.5 MG/3ML) 0.083% NEBULIZER SOLUTION    Take 3 mLs (2.5 mg total) by nebulization every 4 (four) hours as needed for wheezing or shortness of breath.   DOLUTEGRAVIR (TIVICAY) 50 MG TABLET    Take 50 mg by mouth daily.   EMTRICITABINE-TENOFOVIR AF (DESCOVY) 200-25 MG TABLET    Take 1 tablet by mouth daily.   METFORMIN (GLUCOPHAGE) 500 MG TABLET    Take 1 tablet (500 mg total) by mouth 2 (two) times daily with a meal.   NEBULIZERS (COMPRESSOR NEBULIZER) MISC    1 Device by Does not apply route every 6 (six) hours as needed.   PANTOPRAZOLE (PROTONIX) 20 MG TABLET    TAKE 1  TABLET(20 MG) BY MOUTH DAILY   PREDNISONE (DELTASONE) 10 MG TABLET    TAKE 2 TABLETS BY MOUTH DAILY OR AS DIRECTED  Modified Medications   No medications on file  Discontinued Medications   PENICILLIN V POTASSIUM (VEETID) 500 MG TABLET    Take 1 tablet (500 mg total) by mouth 3 (three) times daily.    Subjective: Robert Dawson is in for her routine HIV followup visit.She continues to take [place] without difficulty except that she had some difficulty getting her Tivicay recently.This did not cause her to miss any doses however. She continues to take prednisone for her pulmonary sarcoidosis. She has some dyspnea on exertion but her cough has improved. She recently developed excessive thirst and urinary frequency. She has been losing weight. She went to the emergency department on 09/16/2015 where she was found to have a blood sugar over 400 without evidence of ketoacidosis. The emergency department note indicates that they recommended admission to the hospital to start insulin. She denies being told this. She was given a prescription for Glucophage which he has taken.She has no glucose meter. She does not currently have a primary care provider. She's not feeling any better. She has been sexually active with her long-term male partner.  Review of Systems: Review of Systems  Constitutional: Positive for weight loss and malaise/fatigue. Negative for fever, chills and diaphoresis.  HENT: Negative for sore throat.   Eyes: Positive for blurred vision.  Respiratory: Positive  for shortness of breath. Negative for cough and sputum production.   Cardiovascular: Negative for chest pain.  Gastrointestinal: Positive for heartburn. Negative for nausea, vomiting, abdominal pain and diarrhea.  Genitourinary: Negative for dysuria and frequency.  Musculoskeletal: Negative for myalgias and joint pain.  Skin: Negative for rash.  Neurological: Negative for dizziness and headaches.  Psychiatric/Behavioral:  Positive for depression. Negative for substance abuse. The patient is not nervous/anxious.     Past Medical History  Diagnosis Date  . HIV disease ([place])   . Immune deficiency disorder ([place])   . Depression   . Allergy     Social History  Substance Use Topics  . Smoking status: Never Smoker   . Smokeless tobacco: Never Used  . Alcohol Use: No    Family History  Problem Relation Age of Onset  . Diabetes Maternal Grandmother   . Hypertension Maternal Grandmother     No Known Allergies  Objective:  Filed Vitals:   09/25/15 1108  BP: 145/99  Pulse: 89  Temp: 98.1 F (36.7 C)  TempSrc: Oral  Height: 5\' 9"  (1.753 m)  Weight: 308 lb (139.708 kg)   Body mass index is 45.46 kg/(m^2).  Physical Exam  Constitutional: He is oriented to person, place, and time.  She is upset about her recent problems with hyperglycemia and how she perceives that she was treated in the emergency department.  HENT:  Mouth/Throat: No oropharyngeal exudate.  Eyes: Conjunctivae are normal.  Cardiovascular: Normal rate and regular rhythm.   No murmur heard. Pulmonary/Chest: Breath sounds normal. He has no wheezes. He has no rales.  Abdominal: Soft. He exhibits no mass. There is no tenderness.  Musculoskeletal: Normal range of motion.  Neurological: He is alert and oriented to person, place, and time.  Skin: No rash noted.  Psychiatric:  She has a flat, somewhat depressed affect    Lab Results Lab Results  Component Value Date   WBC 9.9 09/16/2015   HGB 15.6 09/16/2015   HCT 46.0 09/16/2015   MCV 88.1 09/16/2015   PLT 225 09/16/2015    Lab Results  Component Value Date   CREATININE 1.06 09/25/2015   BUN 8 09/25/2015   NA 132* 09/25/2015   K 4.9 09/25/2015   CL 98* 09/25/2015   CO2 25 09/25/2015    Lab Results  Component Value Date   ALT 16 07/17/2015   AST 32 07/17/2015   ALKPHOS 73 07/17/2015   BILITOT 0.6 07/17/2015    Lab Results  Component Value Date   CHOL 154  09/04/2015   HDL 36* 09/04/2015   LDLCALC 90 09/04/2015   TRIG 138 09/04/2015   CHOLHDL 4.3 09/04/2015   HIV 1 RNA QUANT (copies/mL)  Date Value  09/04/2015 <20  08/27/2014 <20  04/02/2013 4953*   CD4 T CELL ABS (/uL)  Date Value  09/04/2015 450  08/27/2014 340*  04/02/2013 580     Problem List Items Addressed This Visit      High   Human immunodeficiency virus (HIV) disease ([place])    Infection remains under very good control. She will continue her current antiretroviral regimen and followup here in 6 months after lab work.      Relevant Orders   T-helper cell (CD4)- ([facility] clinic only)   HIV 1 RNA quant-no reflex-bld   CBC   Comprehensive metabolic panel   Lipid panel   RPR   Hyperglycemia - Primary    She has developed hyperglycemia in the setting of chronic prednisone use  for sarcoidosis. We will try to keep her well hydrated and to get her into see a primary care provider as soon as possible. I will also discuss the possibility of discontinuing prednisone and using methotrexate for her sarcoidosis with Dr. Keturah Barre.      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 We will start benzathine penicillin 2.30million units IM weekly x3. We will then follow serial RPRs.           Other Visit Diagnoses    Encounter for long-term (current) use of medications        Relevant Orders    Lipid panel    Syphilis        Relevant Medications    penicillin g benzathine (BICILLIN LA) 1200000 UNIT/2ML injection 1.2 Million Units (Completed)    penicillin g benzathine (BICILLIN LA) 1200000 UNIT/2ML injection 1.2 Million Units (Completed)         Michel Bickers, MD [facility] for Infectious  [facility] [phone] pager   [phone] cell 09/25/2015, 3:34 PM

## 2015-09-25 NOTE — Assessment & Plan Note (Signed)
Infection remains under very good control. She will continue her current antiretroviral regimen and followup here in 6 months after lab work.

## 2015-09-29 ENCOUNTER — Other Ambulatory Visit: Payer: Self-pay | Admitting: Internal Medicine

## 2015-09-29 ENCOUNTER — Telehealth: Payer: Self-pay | Admitting: *Deleted

## 2015-09-29 DIAGNOSIS — E118 Type 2 diabetes mellitus with unspecified complications: Secondary | ICD-10-CM

## 2015-09-29 NOTE — Telephone Encounter (Signed)
Referral place for [facility] and their office works strictly through [employer]. They will not schedule until MD signs off on referral. Note sent to [name]. Called and spoke to patient this morning and advised her on trying to follow a diabetes diet, as far as limiting carbs and sugars. Robert Dawson

## 2015-10-01 ENCOUNTER — Ambulatory Visit: Payer: Medicare Other | Admitting: Internal Medicine

## 2015-10-02 ENCOUNTER — Ambulatory Visit: Payer: Medicare Other

## 2015-10-02 ENCOUNTER — Ambulatory Visit: Payer: Medicare Other | Admitting: *Deleted

## 2015-10-03 ENCOUNTER — Other Ambulatory Visit: Payer: Self-pay | Admitting: *Deleted

## 2015-10-03 ENCOUNTER — Other Ambulatory Visit: Payer: Self-pay | Admitting: Internal Medicine

## 2015-10-03 ENCOUNTER — Encounter: Payer: Self-pay | Admitting: Pharmacist

## 2015-10-03 ENCOUNTER — Telehealth: Payer: Self-pay | Admitting: *Deleted

## 2015-10-03 ENCOUNTER — Other Ambulatory Visit: Payer: Medicare Other

## 2015-10-03 ENCOUNTER — Ambulatory Visit (INDEPENDENT_AMBULATORY_CARE_PROVIDER_SITE_OTHER): Payer: Medicare Other | Admitting: *Deleted

## 2015-10-03 DIAGNOSIS — E119 Type 2 diabetes mellitus without complications: Secondary | ICD-10-CM

## 2015-10-03 DIAGNOSIS — R7309 Other abnormal glucose: Secondary | ICD-10-CM

## 2015-10-03 DIAGNOSIS — E139 Other specified diabetes mellitus without complications: Secondary | ICD-10-CM

## 2015-10-03 DIAGNOSIS — E1111 Type 2 diabetes mellitus with ketoacidosis with coma: Secondary | ICD-10-CM

## 2015-10-03 DIAGNOSIS — A539 Syphilis, unspecified: Secondary | ICD-10-CM | POA: Diagnosis present

## 2015-10-03 DIAGNOSIS — E1110 Type 2 diabetes mellitus with ketoacidosis without coma: Secondary | ICD-10-CM

## 2015-10-03 HISTORY — DX: Type 2 diabetes mellitus without complications: E11.9

## 2015-10-03 LAB — GLUCOSE, CAPILLARY: Glucose-Capillary: 326 mg/dL — ABNORMAL HIGH (ref 65–99)

## 2015-10-03 MED ORDER — METFORMIN HCL 500 MG PO TABS: 500.0000 mg | ORAL_TABLET | Freq: Two times a day (BID) | ORAL | Status: DC

## 2015-10-03 MED ORDER — PENICILLIN G BENZATHINE 1200000 UNIT/2ML IM SUSP
1.2000 10*6.[IU] | Freq: Once | INTRAMUSCULAR | Status: AC
  Administered 2015-10-03: 1.2 10*6.[IU] via INTRAMUSCULAR

## 2015-10-03 MED ORDER — BLOOD GLUCOSE MONITOR KIT: PACK | Status: AC

## 2015-10-03 NOTE — Telephone Encounter (Signed)
Okay to give one refill of metformin.

## 2015-10-03 NOTE — Telephone Encounter (Signed)
Patient came into clinic for her 2nd bicillin injection and was concerned that she did not have a meter to check her glucose at home. Dr. Baxter Flattery sent an Rx for a glucose kit to [facility] and patient will pick up today. A glucose finger stick was performed and her sugar was 326. She will continue to take her metformin as directed by ED physician. She was again advised on an appropriate diabetic diet and will see her new PCP on 10/20/15.

## 2015-10-03 NOTE — Telephone Encounter (Signed)
Rx sent and patient notified.

## 2015-10-03 NOTE — Progress Notes (Signed)
Will dispense glucometer, lancet, test strips so that she can monitor BS and provide info to new pcp

## 2015-10-03 NOTE — Progress Notes (Signed)
Patient ID: Robert Dawson, male   DOB: 27-Oct-1975, 40 y.o.   MRN: [id number]  I talked to [facility] today about her diabetes.  She is currently taking Metformin 500 mg BID.  Her glucose was very high on 6/20 (>1000) and ~400 today. She has not been able to get in with her PCP for quite some time.  Dr. Baxter Flattery prescribed a glucometer and strips/lancets for her to pick up today.  I explained to her how to take her blood sugar and asked her to keep a diary of her numbers.  I asked her if she could, to record her morning number before she eats, one after lunch, one in the evening, and one before bedtime.  She will follow up with me in 2 weeks to assess her numbers.  Plan: - Glucometer and strips - Take blood glucose 4x/day - Keep a diary - Follow-up with [facility] pharmacist on 7/19 at 2pm  Nyasha Rahilly L. Nicole Kindred, PharmD Infectious Diseases Clinical Pharmacist Pager: [phone] 10/03/2015 11:27 AM

## 2015-10-03 NOTE — Telephone Encounter (Signed)
Patient requesting refill on Metformin. She has six pills left, (enough to get her through the weekend). She has appt with PCP on 10/20/15 and this was originally prescribed by ED doc. Please advise

## 2015-10-09 ENCOUNTER — Ambulatory Visit (INDEPENDENT_AMBULATORY_CARE_PROVIDER_SITE_OTHER): Payer: Medicare Other

## 2015-10-09 DIAGNOSIS — A539 Syphilis, unspecified: Secondary | ICD-10-CM

## 2015-10-09 MED ORDER — PENICILLIN G BENZATHINE 1200000 UNIT/2ML IM SUSP
1.2000 10*6.[IU] | Freq: Once | INTRAMUSCULAR | Status: AC
  Administered 2015-10-09: 1.2 10*6.[IU] via INTRAMUSCULAR

## 2015-10-13 IMAGING — CR DG CHEST 2V
2 series · 2 of 2 positions shown · non-contrast
Comparison: 07/17/2014

CLINICAL DATA: Difficulty breathing for several months

EXAM:
CHEST  2 VIEW

[w chest pa]
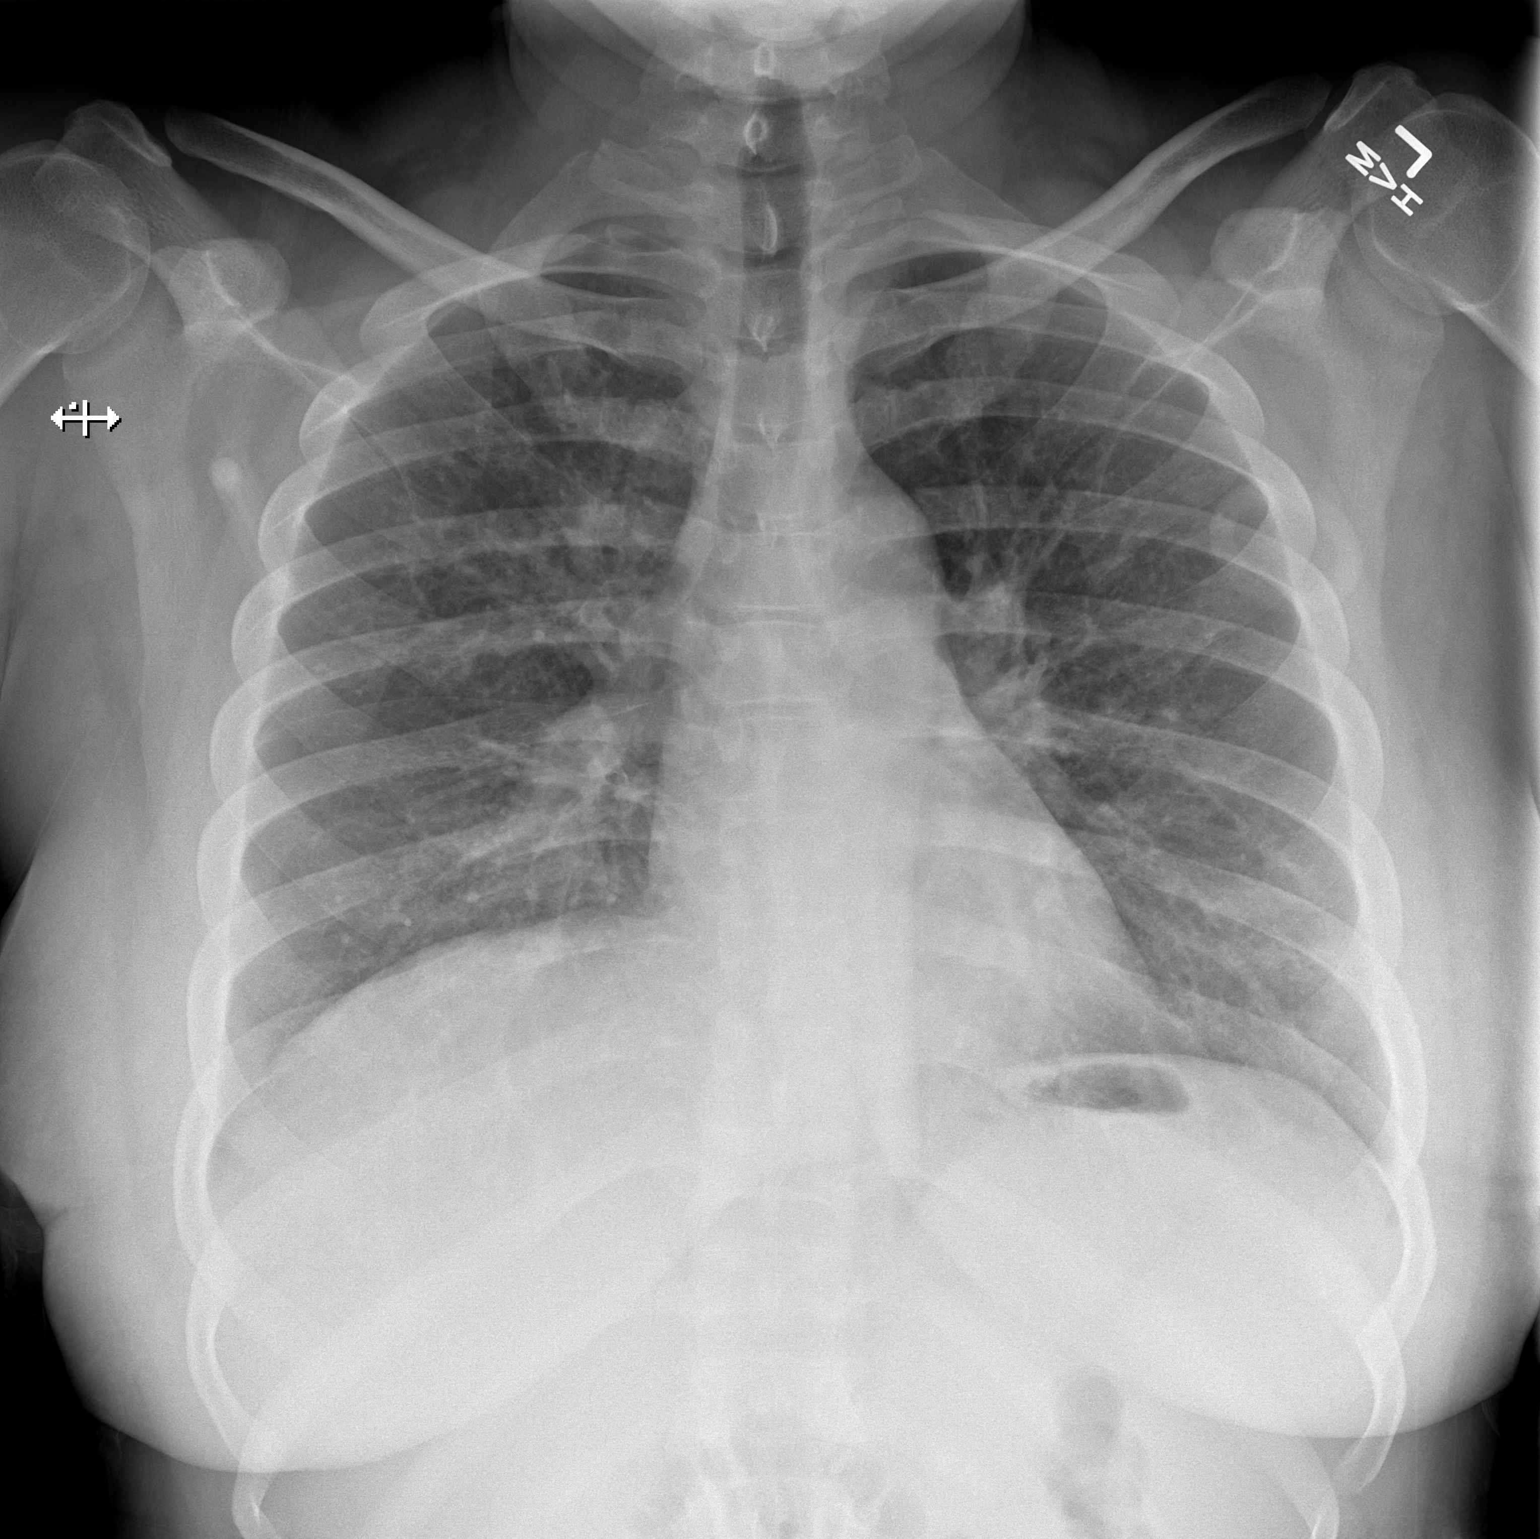

[w chest lat]
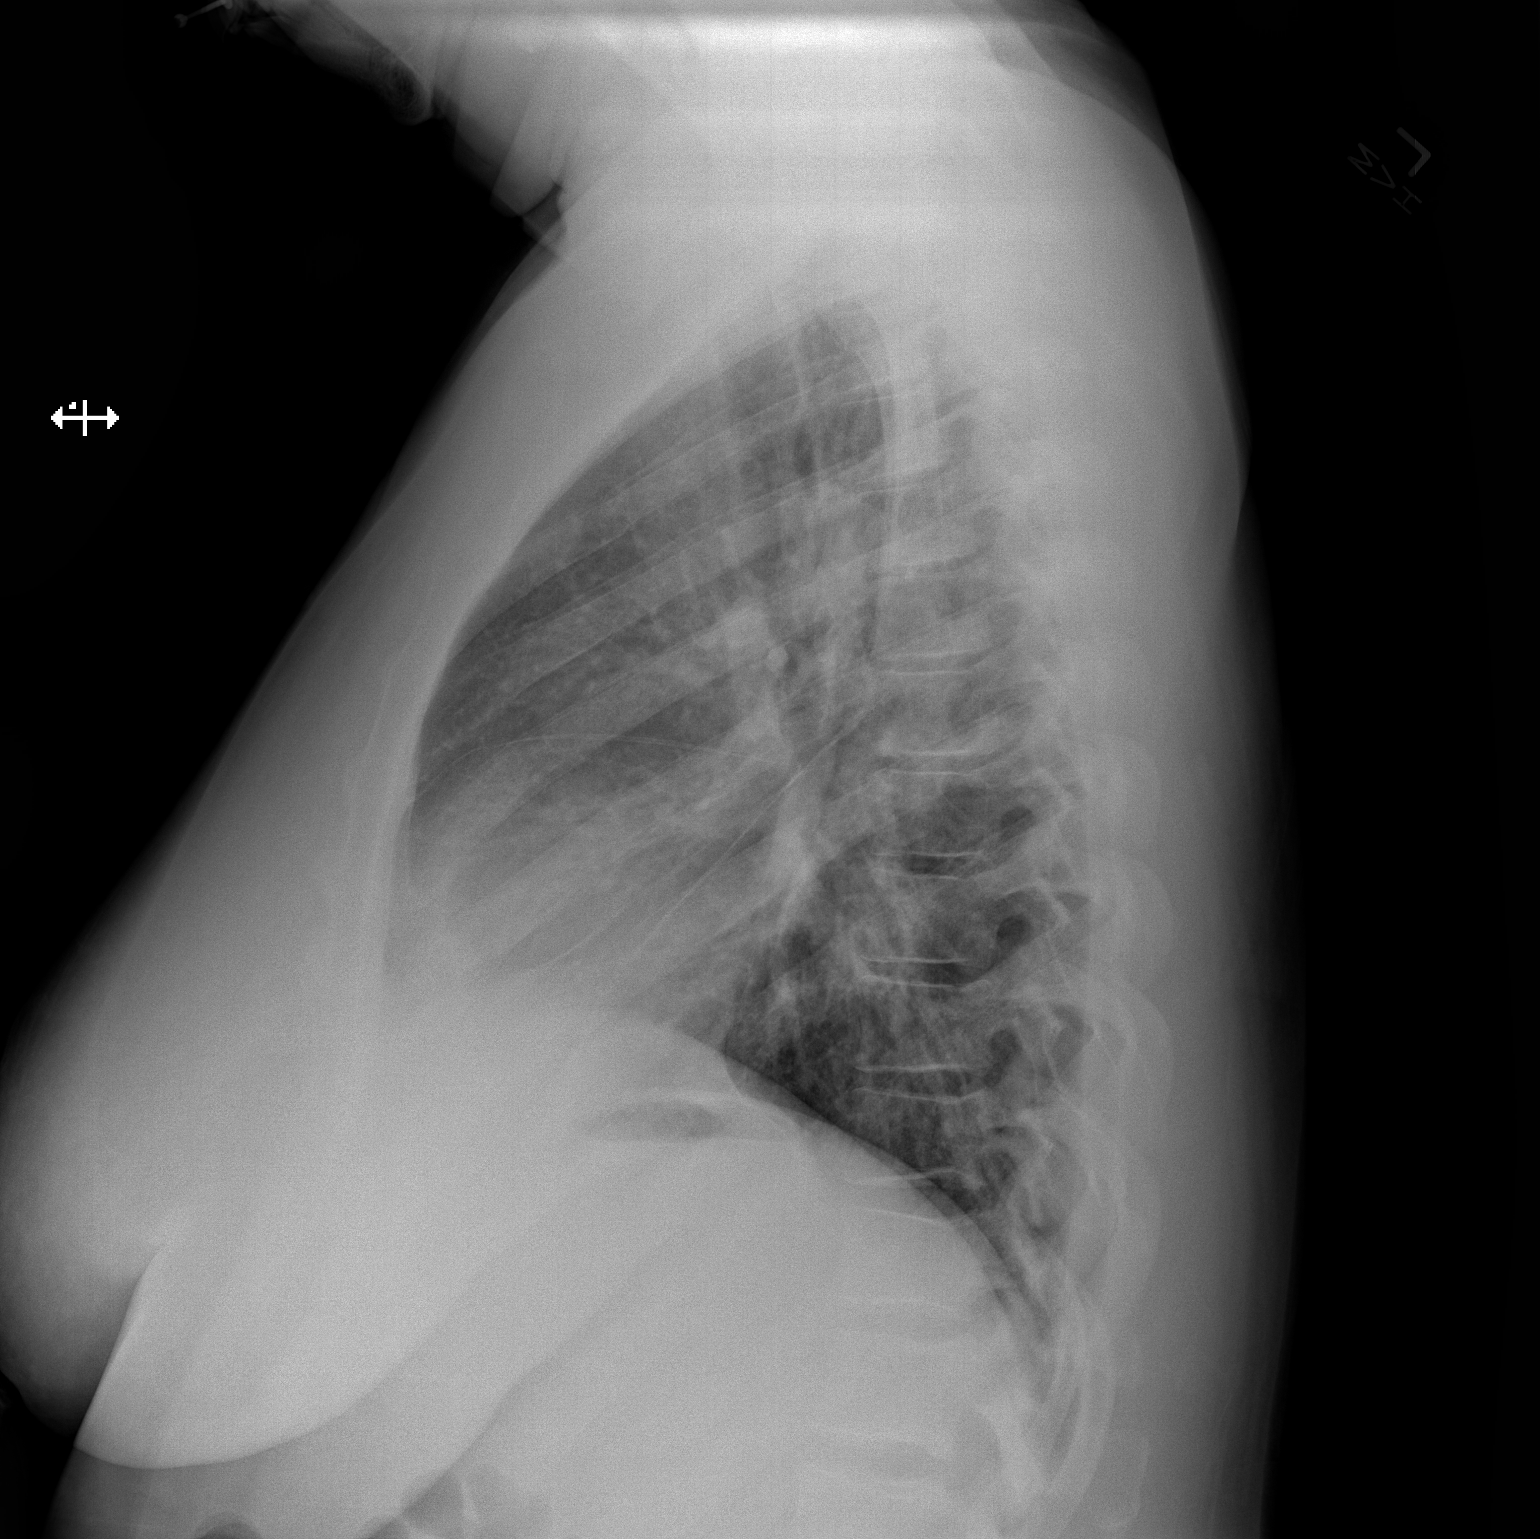

[2 of 2 positions shown; findings below may reference images not displayed]

FINDINGS: Cardiac shadow is within normal limits. The lungs are well aerated
bilaterally. Some patchy increased density is noted in the right
upper lobe. The previously seen patchy changes in the left lung have
resolved in the interval. No effusion or pneumothorax is noted. No
bony abnormality is seen.
IMPRESSION: Patchy right upper lobe infiltrate. This is new from the prior exam
with resolution of previously seen left-sided infiltrate. This
likely represents some waxing and waning infiltrates.

## 2015-10-14 ENCOUNTER — Ambulatory Visit: Payer: Medicare Other

## 2015-10-15 ENCOUNTER — Other Ambulatory Visit: Payer: Self-pay | Admitting: Internal Medicine

## 2015-10-15 ENCOUNTER — Ambulatory Visit: Payer: Medicare Other

## 2015-10-15 ENCOUNTER — Ambulatory Visit: Payer: Medicare Other | Admitting: *Deleted

## 2015-10-20 ENCOUNTER — Other Ambulatory Visit: Payer: Self-pay | Admitting: Internal Medicine

## 2015-10-20 ENCOUNTER — Encounter: Payer: Self-pay | Admitting: Family Medicine

## 2015-10-20 ENCOUNTER — Ambulatory Visit (INDEPENDENT_AMBULATORY_CARE_PROVIDER_SITE_OTHER): Payer: Medicare Other | Admitting: Family Medicine

## 2015-10-20 VITALS — BP 118/80 | HR 105 | Temp 98.7°F | Ht 69.0 in | Wt 307.0 lb

## 2015-10-20 DIAGNOSIS — E138 Other specified diabetes mellitus with unspecified complications: Secondary | ICD-10-CM | POA: Diagnosis not present

## 2015-10-20 LAB — COMPLETE METABOLIC PANEL WITH GFR
ALT: 15 U/L (ref 9–46)
AST: 35 U/L (ref 10–40)
Albumin: 3.5 g/dL — ABNORMAL LOW (ref 3.6–5.1)
Alkaline Phosphatase: 86 U/L (ref 40–115)
BILIRUBIN TOTAL: 0.4 mg/dL (ref 0.2–1.2)
BUN: 7 mg/dL (ref 7–25)
CHLORIDE: 101 mmol/L (ref 98–110)
CO2: 25 mmol/L (ref 20–31)
CREATININE: 1.08 mg/dL (ref 0.60–1.35)
Calcium: 9.2 mg/dL (ref 8.6–10.3)
GFR, Est Non African American: 85 mL/min (ref >=60)
GLUCOSE: 152 mg/dL — AB (ref 65–99)
Potassium: 4.8 mmol/L (ref 3.5–5.3)
Sodium: 137 mmol/L (ref 135–146)
TOTAL PROTEIN: 7.3 g/dL (ref 6.1–8.1)

## 2015-10-20 LAB — LIPID PANEL
CHOL/HDL RATIO: 3.8 ratio (ref <=5.0)
Cholesterol: 151 mg/dL (ref 125–200)
HDL: 40 mg/dL (ref >=40)
LDL Cholesterol: 87 mg/dL (ref <130)
Triglycerides: 120 mg/dL (ref <150)
VLDL: 24 mg/dL (ref <30)

## 2015-10-20 LAB — POCT GLYCOSYLATED HEMOGLOBIN (HGB A1C): Hemoglobin A1C: 11.9

## 2015-10-20 MED ORDER — METFORMIN HCL 1000 MG PO TABS: 1000.0000 mg | ORAL_TABLET | Freq: Two times a day (BID) | ORAL | 3 refills | Status: DC

## 2015-10-20 MED ORDER — GLIPIZIDE 10 MG PO TABS: 10.0000 mg | ORAL_TABLET | Freq: Two times a day (BID) | ORAL | 3 refills | Status: DC

## 2015-10-20 NOTE — Patient Instructions (Signed)
Take both medications twice a day Check blood sugar in AM fasting, 2 hours after lunch and 2 hours after dinner. Bring meter for [place] to review in 2 weeks Avoid sweets, no sugar sweetened drinks. Avoid white foods like rice, potatoes, pasta, flour, sugar, bread. Try to eat lean meats that are baked, broiled, boiled for grilled.

## 2015-10-20 NOTE — IPOC Note (Signed)
Robert Dawson, is a 40 y.o. male  IOX:[id number]  [id number]:[id number]  DOB - [birth year]  CC:  Chief Complaint  Patient presents with  . New Patient (Initial Visit)    get established, saw Dr Rodena Piety here in past, was diagnosed with diabetes 1 month ago here for follow up and treatment of diabetes, takes blood sugar at home 200-300 range, sees Dr Megan Salon [facility] for [facility], does complain of lUQ discomfort comes and goes       HPI: Robert Dawson is a 40 y.o. male here to establish. She has been a patient here in the past, greater than a year ago. She was seen in ED in June and diagnosed with Type II Diabetes. She was put on metformin 500 bid and told to follow-up here. She has a history of HIV and Hep B and is managed by [facility]. She also reports asthma and sarcoidoisis of the lung. She has been treated for syphllis in past. She has been treated in the past for depression but not currently. She transgender, going from male to male. We will address her diabetes today and will have her return for health maintenance.   No Known Allergies Past Medical History:  Diagnosis Date  . Allergy   . Depression   . Diabetes ([place]) 10/03/2015  . HIV disease ([place])   . Immune deficiency disorder [facility])    Current Outpatient Prescriptions on File Prior to Visit  Medication Sig Dispense Refill  . albuterol (PROVENTIL) (2.5 MG/3ML) 0.083% nebulizer solution Take 3 mLs (2.5 mg total) by nebulization every 4 (four) hours as needed for wheezing or shortness of breath. 75 mL 12  . blood glucose meter kit and supplies KIT Dispense based on patient and insurance preference. Use up to four times daily as directed. (FOR ICD-9 250.00, 250.01). 1 each 0  . dolutegravir (TIVICAY) 50 MG tablet Take 50 mg by mouth daily.    Marland Kitchen emtricitabine-tenofovir AF (DESCOVY) 200-25 MG tablet Take 1 tablet by mouth daily. 30 tablet 11  . metFORMIN (GLUCOPHAGE) 500 MG tablet Take 1 tablet (500 mg total) by mouth 2 (two)  times daily with a meal. 60 tablet 0  . Nebulizers (COMPRESSOR NEBULIZER) MISC 1 Device by Does not apply route every 6 (six) hours as needed. 1 each 0  . pantoprazole (PROTONIX) 20 MG tablet TAKE 1 TABLET(20 MG) BY MOUTH DAILY 30 tablet 0  . predniSONE (DELTASONE) 10 MG tablet TAKE 2 TABLETS BY MOUTH DAILY OR AS DIRECTED (Patient taking differently: Take 20 mg by mouth daily. ) 120 tablet 3   No current facility-administered medications on file prior to visit.    Family History  Problem Relation Age of Onset  . Diabetes Maternal Grandmother   . Hypertension Maternal Grandmother    Social History   Social History  . Marital status: Single    Spouse name: N/A  . Number of children: N/A  . Years of education: N/A   Occupational History  . Not on file.   Social History Main Topics  . Smoking status: Never Smoker  . Smokeless tobacco: Never Used  . Alcohol use No  . Drug use: No  . Sexual activity: Not Currently     Comment: given condoms. Pt is an under.   Other Topics Concern  . Not on file   Social History Narrative  . No narrative on file    Review of Systems: Constitutional: Negative for fever, chills, appetite change, weight loss. Positive for fatigue Skin:  Negative for rashes or lesions of concern. HENT: Negative for ear pain, ear discharge.nose bleeds Eyes: Negative for pain, discharge, redness, itching. Intermittent blurry vision Neck: Negative for pain, stiffness Respiratory: Positive for cough, shortness of breath,  Asthma Cardiovascular: Negative for chest pain, palpitations and leg swelling. Gastrointestinal: Negative for abdominal pain, nausea, vomiting. Positive for some diarrhea, heartburn Genitourinary: Negative for dysuria, urgency, frequency, hematuria,  Musculoskeletal: Negative for back joint  swelling, and gait problem.Negative for weakness. Left ankle and foot pain. Neurological: Negative for dizziness, tremors, seizures, syncope,    light-headedness, numbness and headaches.  Hematological: Positive for for easy bruising.  Psychiatric/Behavioral: Positive for depression.   Objective:   Vitals:   10/20/15 0830  BP: 118/80  Pulse: (!) 105  Temp: 98.7 F (37.1 C)    Physical Exam: Constitutional: Patient appears well-developed and well-nourished. No distress. HENT: Normocephalic, atraumatic, External right and left ear normal. Oropharynx is clear and moist.  Eyes: Conjunctivae and EOM are normal. PERRLA, no scleral icterus. Neck: Normal ROM. Neck supple. No lymphadenopathy, No thyromegaly. CVS: RRR, S1/S2 +, no murmurs, no gallops, no rubs Pulmonary: Effort and breath sounds normal, no stridor, rhonchi, wheezes, rales.  Abdominal: Soft. Normoactive BS,, no distension, tenderness, rebound or guarding.  Musculoskeletal: Normal range of motion. No edema and no tenderness.  Neuro: Alert.Normal muscle tone coordination. Non-focal Skin: Skin is warm and dry. No rash noted. Not diaphoretic. No erythema. No pallor. Psychiatric: Normal mood and affect. Behavior, judgment, thought content normal.  Lab Results  Component Value Date   WBC 9.9 09/16/2015   HGB 15.6 09/16/2015   HCT 46.0 09/16/2015   MCV 88.1 09/16/2015   PLT 225 09/16/2015   Lab Results  Component Value Date   CREATININE 1.06 09/25/2015   BUN 8 09/25/2015   NA 132 (L) 09/25/2015   K 4.9 09/25/2015   CL 98 (L) 09/25/2015   CO2 25 09/25/2015    No results found for: HGBA1C Lipid Panel     Component Value Date/Time   CHOL 154 09/04/2015 1446   TRIG 138 09/04/2015 1446   HDL 36 (L) 09/04/2015 1446   CHOLHDL 4.3 09/04/2015 1446   VLDL 28 09/04/2015 1446   LDLCALC 90 09/04/2015 1446       Assessment and plan:   1. Establish care -I have reviewed information presented by the patient and pertinent information in chart  2. Diabetes, Type II -Increase metformin to 1000 mg bid. -Add glipizide 10 mg, one po bid.   Follow-up in 2 weeks  with meter.  The patient was given clear instructions to go to ER or return to medical center if symptoms don't improve, worsen or new problems develop. The patient verbalized understanding.    Micheline Chapman FNP  10/20/2015, 8:52 AM

## 2015-10-21 NOTE — Progress Notes (Signed)
Robert Dawson, is a 40 y.o. male  [id number]  [id number]  DOB - May 18, 1975  CC:  Chief Complaint  Patient presents with  . New Patient (Initial Visit)    get established, saw Dr Rodena Piety here in past, was diagnosed with diabetes 1 month ago here for follow up and treatment of diabetes, takes blood sugar at home 200-300 range, sees Dr Megan Salon at [facility] for [facility], does complain of lUQ discomfort comes and goes       HPI: Robert Dawson is a 40 y.o. male here to establish care. She has been a patient here in the past but has not been seen in greater than one year. She was seen in ED about a month ago and was diagnosed with Type II Diabetes. She was started on metformin 500 mg bid and asked to follow-up here. She reports her home BS since starting metforming have been 200-300. Her A1C today is 11.9.  She has a history of depression, HIV, asthma. Her medications are listed in her medication list.She reports trying to avoid sweets but not having a good understanding of a appropriate diabetic diet.  Does not exercise regularly.   Will address health maintenance at a later visit.  No Known Allergies Past Medical History:  Diagnosis Date  . Allergy   . Depression   . Diabetes ([place]) 10/03/2015  . HIV disease ([place])   . Immune deficiency disorder [facility])    Current Outpatient Prescriptions on File Prior to Visit  Medication Sig Dispense Refill  . albuterol (PROVENTIL) (2.5 MG/3ML) 0.083% nebulizer solution Take 3 mLs (2.5 mg total) by nebulization every 4 (four) hours as needed for wheezing or shortness of breath. 75 mL 12  . blood glucose meter kit and supplies KIT Dispense based on patient and insurance preference. Use up to four times daily as directed. (FOR ICD-9 250.00, 250.01). 1 each 0  . dolutegravir (TIVICAY) 50 MG tablet Take 50 mg by mouth daily.    Marland Kitchen emtricitabine-tenofovir AF (DESCOVY) 200-25 MG tablet Take 1 tablet by mouth daily. 30 tablet 11  . Nebulizers  (COMPRESSOR NEBULIZER) MISC 1 Device by Does not apply route every 6 (six) hours as needed. 1 each 0  . pantoprazole (PROTONIX) 20 MG tablet TAKE 1 TABLET(20 MG) BY MOUTH DAILY 30 tablet 0  . predniSONE (DELTASONE) 10 MG tablet TAKE 2 TABLETS BY MOUTH DAILY OR AS DIRECTED (Patient taking differently: Take 20 mg by mouth daily. ) 120 tablet 3   No current facility-administered medications on file prior to visit.    Family History  Problem Relation Age of Onset  . Diabetes Maternal Grandmother   . Hypertension Maternal Grandmother    Social History   Social History  . Marital status: Single    Spouse name: N/A  . Number of children: N/A  . Years of education: N/A   Occupational History  . Not on file.   Social History Main Topics  . Smoking status: Never Smoker  . Smokeless tobacco: Never Used  . Alcohol use No  . Drug use: No  . Sexual activity: Not Currently     Comment: given condoms. Pt is an under.   Other Topics Concern  . Not on file   Social History Narrative  . No narrative on file    Review of Systems: Constitutional: Negative for fever, chills, appetite change, weight loss. Positive for fatigue Skin: Negative for rashes or lesions of concern. HENT: Negative for ear pain, ear discharge.nose bleeds Eyes:  Negative for pain, discharge, redness, itching and visual disturbance. Occasional blurry vision Neck: Negative for pain, stiffness Respiratory: Negative for cough. Occasional shortness of breath from asthma Cardiovascular: Negative for chest pain, palpitations and leg swelling. Gastrointestinal: Negative for abdominal pain, nausea, vomiting,  Constipations. Some diarrhea and heartburn Genitourinary: Negative for dysuria, urgency, frequency, hematuria,  Musculoskeletal: Negative for back pain, joint pain, joint  swelling, and gait problem.Negative for weakness. Neurological: Negative for dizziness, tremors, seizures, syncope,   light-headedness, numbness and  headaches.  Hematological: Negative for easy bruising or bleeding Psychiatric/Behavioral: Positive for depression, anxiety,  Objective:   Vitals:   10/20/15 0830  BP: 118/80  Pulse: (!) 105  Temp: 98.7 F (37.1 C)    Physical Exam: Constitutional: Patient appears well-developed and well-nourished. No distress. HENT: Normocephalic, atraumatic, External right and left ear normal. Oropharynx is clear and moist.  Eyes: Conjunctivae and EOM are normal. PERRLA, no scleral icterus. Neck: Normal ROM. Neck supple. No lymphadenopathy, No thyromegaly. CVS: RRR, S1/S2 +, no murmurs, no gallops, no rubs Pulmonary: Effort and breath sounds normal, no stridor, rhonchi, wheezes, rales.  Abdominal: Soft. Normoactive BS,, no distension, tenderness, rebound or guarding.  Musculoskeletal: Normal range of motion. No edema and no tenderness.  Neuro: Alert.Normal muscle tone coordination. Non-focal Skin: Skin is warm and dry. No rash noted. Not diaphoretic. No erythema. No pallor. Psychiatric: Normal mood and affect. Behavior, judgment, thought content normal.  Lab Results  Component Value Date   WBC 9.9 09/16/2015   HGB 15.6 09/16/2015   HCT 46.0 09/16/2015   MCV 88.1 09/16/2015   PLT 225 09/16/2015   Lab Results  Component Value Date   CREATININE 1.08 10/20/2015   BUN 7 10/20/2015   NA 137 10/20/2015   K 4.8 10/20/2015   CL 101 10/20/2015   CO2 25 10/20/2015    Lab Results  Component Value Date   HGBA1C 11.9 10/20/2015   Lipid Panel     Component Value Date/Time   CHOL 151 10/20/2015 0923   TRIG 120 10/20/2015 0923   HDL 40 10/20/2015 0923   CHOLHDL 3.8 10/20/2015 0923   VLDL 24 10/20/2015 0923   LDLCALC 87 10/20/2015 0923       Assessment and plan:   1. Diabetes mellitus of other type with complication ([place])  - POCT glycosylated hemoglobin (Hb A1C) - COMPLETE METABOLIC PANEL WITH GFR - Glucose (CBG) - Lipid panel   Return in about 2 weeks (around 11/03/2015) for  Diabetes.  The patient was given clear instructions to go to ER or return to medical center if symptoms don't improve, worsen or new problems develop. The patient verbalized understanding.    Micheline Chapman FNP  10/21/2015, 7:56 AM

## 2015-11-03 ENCOUNTER — Other Ambulatory Visit (INDEPENDENT_AMBULATORY_CARE_PROVIDER_SITE_OTHER): Payer: Medicare Other

## 2015-11-03 DIAGNOSIS — E138 Other specified diabetes mellitus with unspecified complications: Secondary | ICD-10-CM

## 2015-11-03 LAB — GLUCOSE, CAPILLARY: GLUCOSE-CAPILLARY: 112 mg/dL — AB (ref 65–99)

## 2015-11-06 ENCOUNTER — Other Ambulatory Visit: Payer: Self-pay | Admitting: Internal Medicine

## 2015-11-17 ENCOUNTER — Ambulatory Visit (INDEPENDENT_AMBULATORY_CARE_PROVIDER_SITE_OTHER): Payer: Medicare Other | Admitting: Internal Medicine

## 2015-11-17 ENCOUNTER — Encounter: Payer: Self-pay | Admitting: Internal Medicine

## 2015-11-17 ENCOUNTER — Other Ambulatory Visit: Payer: Medicare Other

## 2015-11-17 ENCOUNTER — Ambulatory Visit (INDEPENDENT_AMBULATORY_CARE_PROVIDER_SITE_OTHER)
Admission: RE | Admit: 2015-11-17 | Discharge: 2015-11-17 | Disposition: A | Discharge status: Home / Self Care | Payer: Medicare Other | Source: Ambulatory Visit | Attending: Internal Medicine | Admitting: Internal Medicine

## 2015-11-17 VITALS — BP 124/76 | HR 85 | Ht 69.0 in | Wt 305.0 lb

## 2015-11-17 DIAGNOSIS — D860 Sarcoidosis of lung: Secondary | ICD-10-CM

## 2015-11-17 DIAGNOSIS — D869 Sarcoidosis, unspecified: Secondary | ICD-10-CM | POA: Diagnosis not present

## 2015-11-17 DIAGNOSIS — F640 Transsexualism: Secondary | ICD-10-CM | POA: Diagnosis not present

## 2015-11-17 DIAGNOSIS — B20 Human immunodeficiency virus [HIV] disease: Secondary | ICD-10-CM | POA: Diagnosis not present

## 2015-11-17 NOTE — Patient Instructions (Signed)
We are going to gradually reduce how much prednisone you take to see what happens to your sarcoid.  When the prednisone dose comes down, I expect your blood sugars to be lower. You will need to make plans for this with your primary care provider. If your blood sugar falls too fast, or gets too low, it could be dangerous. Please call your primary care provider if your sugar gets lower than 80.  If the sarcoid flares again, we may want to try Methotrexate instead of, or in addition to, prednisone. We will talk more about Methotrexate if we decide to use it.  Starting tomorrow: Take prednisone 30 mg daily ( 3 tablets)  X 7 days  Then take 20 mg daily (2 tabs) x 7 days  Then take 10 mg daily until we see you here again.   Don't run out of prednisone- If you are getting low, call us for refill.  Order- CXR  Dx sarcoid  Order- lab- ACE level   Dx Sarcoid

## 2015-11-17 NOTE — Progress Notes (Signed)
07/15/14- 40 yo M/ F transgender never smoker w/ hx syphilis, HIV  referred by Dr. Zigmund Daniel; Wheezing; Choking sensation; hard to breathe; Syncope episodes after coughing so hard it caused her to faint; unable to eat, will throw up her food, has lost 24lbs due to inability to eat; History of asthma since childhood.  PFT done 10/25/2013- severe obstructive airways disease- with insignificant response to bronchodilator, possible restriction, normal diffusion FVC 3.13/73%, FEV1 1.86/53%, FEV1/FVC 0.59, FEF 25-75 percent 0.95, TLC 83%, DLCO 89%. She is aware of active reflux with easy shortness of breath especially while coughing. Scant white sputum. Little heartburn. Some wheeze but albuterol inhaler is not much help. Wakes at night coughing and choking short of breath if she lies on left side. CT chest 02/07/14-  IMPRESSION: No evidence of pulmonary embolism. Multifocal patchy/ nodular opacities, left lower lobe predominant, suspicious for multifocal pneumonia. Mediastinal/bilateral hilar lymphadenopathy, likely reactive. Electronically Signed  By: Julian Hy M.D.  On: 02/07/2014 40: CXR 05/25/14 IMPRESSION: Bilateral lymphadenopathy in the hila as well as left basilar patchy infiltrate. This raises suspicion for possible sarcoidosis given its relative chronicity. Alternatively this may represent recurrent multifocal pneumonia. Electronically Signed  By: Inez Catalina M.D.  On: 05/25/2014 10:40  07/22/14- 40 yo M/ F transgender never smoker w/ hx syphilis, HIV followed for interstitial infiltrates, cough, GERD, Sarcoid Follows for: hasn't heard from [facility]: lab results; SOB at all times; prod cough at times w/clear mucus; CP with lots of coughing; ACE 06/2014- >100     BNP 18.5     CMET creat 1.21  Hgb 12.9  Troponin 0 Cough is about the same, productive of white sputum. Denies night sweats or adenopathy. Some minor arthralgias in the knees. DME company is coming today to her home  bringing nebulizer machine. Anoro didn't help much.  08/15/14- 40 yo M/ F transgender never smoker followed for Sarcoid w/ hx syphilis, HIV followed for interstitial infiltrates, cough, GERD,  Bronch 08/07/14 + sarcoid with severe airway involvement I had communicated w Dr Zigmund Daniel PCP and Dr Drucilla Schmidt for Dr Megan Salon ID.  Follows [id number] HAVING SOB SINCE LAST VISIT, COUGHING HAS GOTTEN WORST CAN HARDLY HOLD FOOD DOWN HAS LOST 9 LBS SINCE LAST VISIT Discussed sarcoid and prednisone in context of HIV, with UpTo Date patient printout. Again emphasized importance of reflux control. I still think reflux is a likely contributor to the cough problem. I really pressed point that she must be very careful with her antiviral therapy as we start steroids. Discussed steroid side effects.  09/19/14- 40 yo M/ F transgender never smoker followed for Sarcoid w/ hx syphilis, HIV followed for interstitial infiltrates, cough, GERD, complicated by depression  Bronch 08/07/14 + sarcoid with severe airway involvement Prednisone begun 5/19 40 mg daily Being followed on antivirals by Dr Campbell/ ID FOLLOWS FOR: Pt c/o stable sob with exertion.  Pt c/o L sided underarm swelling after starting prednisone.   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 Never before. Cough much better on prednisone. Still DOE doing housework. Much less need for inhalers. CXR 08/12/14 IMPRESSION: Patchy right upper lobe infiltrate. This is new from the prior exam with resolution of previously seen left-sided infiltrate. This likely represents some waxing and waning infiltrates. Electronically Signed  By: Inez Catalina M.D.  On: 08/12/2014 15:00  10/22/14- 40 yo M/ F transgender never smoker followed for Sarcoid w/ hx syphilis, HIV followed for interstitial infiltrates, cough, GERD, complicated by depression  Bronch 08/07/14 + sarcoid with severe airway involvement  Follows Dr Megan Salon ID [place] 6/26 for HIV Doxy helped axillary  adenitis-resolved Has continued prednisone at 30 mg daily over the past month. Little cough. Still aware of dyspnea on exertion and weight gain. Chest x-ray images reviewed CXR 09/19/14 IMPRESSION: Interval clearing of right upper lobe infiltrate. No evidence of new abnormality. Electronically Signed  By: Logan Bores  On: 09/19/2014 09:55  11/22/14-40 yo M/ F transgender never smoker followed for Sarcoid w/ hx syphilis, HIV followed for interstitial infiltrates, cough, GERD, complicated by depression FOLLOWS FOR:Pt states she continues to have SOB and weight gain.  Prednisone 30 mg daily since last here. Much less cough and no acute events. Can reduce to 20 mg  07/17/2015-40 year-old male/male transgender never smoker followed male/male transgender never smoker followed for Sarcoid with history syphilis, HIV, followed for interstitial infiltrates, cough, GERD/aspiration pneumonia, complicated by depression FOLLOWS FOR: Pt states she went back up to 4 tablets QD of Prednisone 10 mg due to cough and wheezing. Pt feels better at 40mg  QD. Pt also wants to get handicap placard as well. Weight is now 334 pounds Still dyspnea on exertion and dry cough but much better on prednisone. This is her first return since last summer. She says she is making an appointment to follow-up with infectious disease as well.  11/13/2015-40 year old male/male transgender never smoker followed for Sarcoid, complicated by history syphilis, HIV, followed for interstitial infiltrates, cough, GERD/aspiration pneumonia, depression, diabetes CXR 07/18/2015-NAD On prednisone blood sugars had become difficult to control requiring hospitalization. I communicated with Dr. Nicolette Bang about considering methotrexate as an alternative. Has continued prednisone 40 mg daily, sugar control with glipizide and metformin. Describes occasional minor dry coughing spells but no fever, night sweat or swollen glands.                        ROS-see HPI   Negative unless  "+" Constitutional:    weight loss, night sweats, fevers, chills, fatigue, lassitude. HEENT:    headaches, difficulty swallowing, tooth/dental problems, sore throat,       sneezing, itching, ear ache, nasal congestion, post nasal drip, snoring CV:    chest pain, orthopnea, PND, swelling in lower extremities, anasarca,                                                       dizziness, palpitations Resp:   + shortness of breath with exertion or at rest.                productive cough,  + non-productive cough, coughing up of blood.              change in color of mucus.  wheezing.   Skin:    rash or lesions. GI:  No-   heartburn, indigestion, abdominal pain, nausea, vomiting,  GU:  MS:   joint pain, stiffness, decreased range of motion, back pain. Neuro-     nothing unusual Psych:  No-change in mood or affect.  +depression or anxiety.   memory loss.   OBJ- Physical Exam  +presents as male, + obese General- Alert, Oriented, Affect-appropriate, Distress- none acute Skin- rash-none, lesions- none, excoriation- none, + tattoos Lymphadenopathy- L axilla puffy, tender Head- atraumatic            Eyes- Gross vision intact, PERRLA, conjunctivae and secretions clear  Ears- Hearing, canals-normal            Nose- Clear, no-Septal dev, mucus, polyps, erosion, perforation             Throat- Mallampati II , mucosa+tongue lightly coated , drainage- none, tonsils- atrophic Neck- flexible , trachea midline, no stridor , thyroid nl, carotid no bruit Chest - symmetrical excursion , unlabored           Heart/CV- RRR , no murmur , no gallop  , no rub, nl s1 s2                           - JVD- none , edema- none, stasis changes- none, varices- none           Lung- Clear, unlabored, cough-none , dullness-none, rub- none           Chest wall-  Abd-  Br/ Gen/ Rectal- Not done, not indicated Extrem- cyanosis- none, clubbing, none, atrophy- none, strength- nl Neuro- grossly intact to  observation

## 2015-11-18 LAB — ANGIOTENSIN CONVERTING ENZYME: Angiotensin-Converting Enzyme: >100 U/L — ABNORMAL HIGH (ref 8–52)

## 2015-12-02 NOTE — Assessment & Plan Note (Signed)
Because of steroid-induced hyperglycemia we are hoping to reduce steroids or possibly replace with methotrexate. I discussed methotrexate with her at this visit. First question is what her sarcoid will do with steroid reduction. Hopefully at some point it will burnout. Plan-educated prednisone/diabetes/sarcoid therapy choices. Lab for ACE level and chest x-ray on 40 mg prednisone. Tapering prednisone by 10 mg per week down to maintenance 10 mg daily if stable.

## 2015-12-18 ENCOUNTER — Other Ambulatory Visit: Payer: Self-pay | Admitting: Internal Medicine

## 2015-12-30 ENCOUNTER — Encounter: Payer: Self-pay | Admitting: Internal Medicine

## 2015-12-30 ENCOUNTER — Other Ambulatory Visit (INDEPENDENT_AMBULATORY_CARE_PROVIDER_SITE_OTHER): Payer: Medicare Other

## 2015-12-30 ENCOUNTER — Ambulatory Visit (INDEPENDENT_AMBULATORY_CARE_PROVIDER_SITE_OTHER): Payer: Medicare Other | Admitting: Internal Medicine

## 2015-12-30 ENCOUNTER — Ambulatory Visit (INDEPENDENT_AMBULATORY_CARE_PROVIDER_SITE_OTHER)
Admission: RE | Admit: 2015-12-30 | Discharge: 2015-12-30 | Disposition: A | Discharge status: Home / Self Care | Payer: Medicare Other | Source: Ambulatory Visit | Attending: Internal Medicine | Admitting: Internal Medicine

## 2015-12-30 VITALS — BP 124/72 | HR 77 | Ht 69.0 in | Wt 324.4 lb

## 2015-12-30 DIAGNOSIS — D860 Sarcoidosis of lung: Secondary | ICD-10-CM

## 2015-12-30 DIAGNOSIS — D869 Sarcoidosis, unspecified: Secondary | ICD-10-CM

## 2015-12-30 DIAGNOSIS — J449 Chronic obstructive pulmonary disease, unspecified: Secondary | ICD-10-CM

## 2015-12-30 DIAGNOSIS — R300 Dysuria: Secondary | ICD-10-CM

## 2015-12-30 DIAGNOSIS — Z23 Encounter for immunization: Secondary | ICD-10-CM

## 2015-12-30 DIAGNOSIS — N390 Urinary tract infection, site not specified: Secondary | ICD-10-CM | POA: Diagnosis not present

## 2015-12-30 DIAGNOSIS — F640 Transsexualism: Secondary | ICD-10-CM

## 2015-12-30 LAB — URINALYSIS, ROUTINE W REFLEX MICROSCOPIC
BILIRUBIN URINE: NEGATIVE
Ketones, ur: NEGATIVE
NITRITE: NEGATIVE
Specific Gravity, Urine: 1.025 (ref 1.000–1.030)
Total Protein, Urine: NEGATIVE
Urine Glucose: NEGATIVE
Urobilinogen, UA: 0.2 (ref 0.0–1.0)
pH: 5.5 (ref 5.0–8.0)

## 2015-12-30 MED ORDER — ALBUTEROL SULFATE HFA 108 (90 BASE) MCG/ACT IN AERS: 2.0000 | INHALATION_SPRAY | Freq: Four times a day (QID) | RESPIRATORY_TRACT | 12 refills | Status: DC | PRN

## 2015-12-30 NOTE — Assessment & Plan Note (Signed)
Describes frequency and lower abdominal pressure sensation. She is concerned she might have urine infection. Plan-UA/CX

## 2015-12-30 NOTE — Patient Instructions (Addendum)
Script printed for ProAir HFA rescue inhaler to use when needed if away from your nebulizer machine  Flu vax  Reduce prednisone to 10 mg daily. Let us know when you need refill- don't wait till you run out.  Order- CXR    Dx sarcoid  Order- U/A w CX     Dx UTI  Please call as needed

## 2015-12-30 NOTE — Assessment & Plan Note (Signed)
Continues to need nebulizer treatments, Advair. Plan-Taper prednisone to 10 mg daily. Continue Advair and nebulizer as discussed. Refill [employer]

## 2015-12-30 NOTE — Assessment & Plan Note (Signed)
On prednisone 20 mg daily since last visit with controlled symptoms and no exacerbation to suggest flare of sarcoid despite elevated ACE level. Our hope is to reduce prednisone to minimal levels, taking pressure off of blood sugar. If necessary we have discussed trial of methotrexate. Plan-reduce prednisone to 10 mg daily. Flu vaccine, CXR

## 2015-12-30 NOTE — Progress Notes (Signed)
07/15/14- 40 yo M/ F transgender never smoker w/ hx syphilis, HIV  referred by Dr. Zigmund Daniel;  Follow for sarcoid, complicated by GERD/aspiration pneumonia, depression, DM2  11/13/2015-40 year old male/male transgender never smoker followed for Sarcoid, complicated by history syphilis, HIV, followed for interstitial infiltrates, cough, GERD/aspiration pneumonia, depression, diabetes CXR 07/18/2015-NAD On prednisone blood sugars had become difficult to control requiring hospitalization. I communicated with Dr. Nicolette Bang about considering methotrexate as an alternative. Has continued prednisone 40 mg daily, sugar control with glipizide and metformin. Describes occasional minor dry coughing spells but no fever, night sweat or swollen glands.  12/30/2015-40 year old male/male transgender never smoker followed for Sarcoid, complicated by history syphilis, HIV, interstitial infiltrates, cough, GERD/aspiration pneumonia, depression, DM FOLLOWS FOR: Pt continues to have increased wheezing and continues Prednisone 10mg  2 tablets QD. We had begun prednisone reduction at last visit to see what minimum dose would be required to control symptoms. Hyperglycemia on steroids. ACE 11/17/15- > 100 H Concerned about possible UTI-lower abdominal pressure sensation and nocturia without burning or fever. Has not had gender change genital surgery. Only a little occasional wheeze since prednisone reduced. Uses nebulizer 3 times daily-helps. Occasional rescue inhaler. Continues Advair. Not coughing. Denies night sweats. CXR 11/17/2015     IMPRESSION: No edema or consolidation.  No demonstrable adenopathy. Electronically Signed   By: Lowella Grip III M.D.   On: 11/17/2015 12:02                   ROS-see HPI   Negative unless "+" Constitutional:    weight loss, night sweats, fevers, chills, fatigue, lassitude. HEENT:    headaches, difficulty swallowing, tooth/dental problems, sore throat,       sneezing,  itching, ear ache, nasal congestion, post nasal drip, snoring CV:    chest pain, orthopnea, PND, swelling in lower extremities, anasarca,                                                       dizziness, palpitations Resp:   + shortness of breath with exertion or at rest.                productive cough,   non-productive cough, coughing up of blood.              change in color of mucus.  +wheezing.   Skin:    rash or lesions. GI:  No-   heartburn, indigestion, abdominal pain, nausea, vomiting,  GU: + HPI MS:   joint pain, stiffness, decreased range of motion, back pain. Neuro-     nothing unusual Psych:  No-change in mood or affect.  +depression or anxiety.   memory loss.   OBJ- Physical Exam  +presents as male, + obese General- Alert, Oriented, Affect-appropriate, Distress- none acute Skin- rash-none, lesions- none, excoriation- none, + tattoos Lymphadenopathy- L axilla puffy, tender Head- atraumatic            Eyes- Gross vision intact, PERRLA, conjunctivae and secretions clear            Ears- Hearing, canals-normal            Nose- Clear, no-Septal dev, mucus, polyps, erosion, perforation             Throat- Mallampati II , mucosa+tongue lightly coated , drainage- none, tonsils- atrophic Neck- flexible , trachea midline,  no stridor , thyroid nl, carotid no bruit Chest - symmetrical excursion , unlabored           Heart/CV- RRR , no murmur , no gallop  , no rub, nl s1 s2                           - JVD- none , edema- none, stasis changes- none, varices- none           Lung- Clear, unlabored, cough-none , dullness-none, rub- none           Chest wall-  Abd-  Br/ Gen/ Rectal- Not done, not indicated Extrem- cyanosis- none, clubbing, none, atrophy- none, strength- nl Neuro- grossly intact to observation

## 2016-02-03 ENCOUNTER — Ambulatory Visit (INDEPENDENT_AMBULATORY_CARE_PROVIDER_SITE_OTHER): Payer: Medicare Other | Admitting: Family Medicine

## 2016-02-03 ENCOUNTER — Encounter: Payer: Self-pay | Admitting: Family Medicine

## 2016-02-03 VITALS — BP 127/87 | HR 95 | Temp 97.9°F | Resp 18 | Ht 69.0 in | Wt 322.0 lb

## 2016-02-03 DIAGNOSIS — R300 Dysuria: Secondary | ICD-10-CM

## 2016-02-03 DIAGNOSIS — M25559 Pain in unspecified hip: Secondary | ICD-10-CM

## 2016-02-03 DIAGNOSIS — E119 Type 2 diabetes mellitus without complications: Secondary | ICD-10-CM | POA: Diagnosis not present

## 2016-02-03 DIAGNOSIS — E118 Type 2 diabetes mellitus with unspecified complications: Secondary | ICD-10-CM | POA: Diagnosis not present

## 2016-02-03 LAB — POCT GLYCOSYLATED HEMOGLOBIN (HGB A1C): HEMOGLOBIN A1C: 5.5

## 2016-02-03 LAB — POCT URINALYSIS DIP (DEVICE)
Bilirubin Urine: NEGATIVE
GLUCOSE, UA: NEGATIVE mg/dL
Ketones, ur: NEGATIVE mg/dL
Nitrite: NEGATIVE
PROTEIN: NEGATIVE mg/dL
Specific Gravity, Urine: 1.025 (ref 1.005–1.030)
UROBILINOGEN UA: 0.2 mg/dL (ref 0.0–1.0)
pH: 5.5 (ref 5.0–8.0)

## 2016-02-03 MED ORDER — LISINOPRIL 5 MG PO TABS: 5.0000 mg | ORAL_TABLET | Freq: Every day | ORAL | 3 refills | Status: DC

## 2016-02-03 NOTE — Patient Instructions (Signed)
Will do urine culture to check for infection. If that is negative. Will refer to urologist.

## 2016-02-04 MED ORDER — LISINOPRIL 5 MG PO TABS: 5.0000 mg | ORAL_TABLET | Freq: Every day | ORAL | 3 refills | Status: DC

## 2016-02-04 MED ORDER — METFORMIN HCL 1000 MG PO TABS: 1000.0000 mg | ORAL_TABLET | Freq: Two times a day (BID) | ORAL | 3 refills | Status: DC

## 2016-02-04 MED ORDER — NAPROXEN 500 MG PO TABS: 500.0000 mg | ORAL_TABLET | Freq: Two times a day (BID) | ORAL | 2 refills | Status: DC

## 2016-02-04 MED ORDER — GLIPIZIDE 10 MG PO TABS: 10.0000 mg | ORAL_TABLET | Freq: Two times a day (BID) | ORAL | 3 refills | Status: DC

## 2016-02-04 NOTE — Progress Notes (Signed)
Robert Dawson, is a 40 y.o. male  JAS:[id number]  ALP:[id number]  DOB - December 15, 1975  CC:  Chief Complaint  Patient presents with  . Urinary Retention  . Pelvic Pain    pelvic pain/pressure   . Hip Pain    pain in hip/buttock where silicon injections years before.   . Follow-up       HPI: Robert Dawson is a 40 y.o. male who is in the process of changing genders. Is here for follow-up diabetes. He has HIV and is followed by [facility]. Also complaining today of urinary hesitancy. There is a history of depression. Current medications are albuterol, protonix, glipizide, metformin. Is not on ACE/ARB. Other meds are listed in medication list and have been reviewed. Main complaint is of pain and hardness tissues of hips from previous silicon injections into hips. Does have asthma which is managed by pulmonology. Last A1C was 11.9, today is    5.5  Health Maintenance: Has had flu shot for the season. Needs a dilated eye exam.    No Known Allergies Past Medical History:  Diagnosis Date  . Allergy   . Depression   . Diabetes ([place]) 10/03/2015  . HIV disease ([place])   . Immune deficiency disorder [facility])    Current Outpatient Prescriptions on File Prior to Visit  Medication Sig Dispense Refill  . albuterol (PROAIR HFA) 108 (90 Base) MCG/ACT inhaler Inhale 2 puffs into the lungs every 6 (six) hours as needed for wheezing or shortness of breath. 1 Inhaler 12  . albuterol (PROVENTIL) (2.5 MG/3ML) 0.083% nebulizer solution Take 3 mLs (2.5 mg total) by nebulization every 4 (four) hours as needed for wheezing or shortness of breath. 75 mL 12  . blood glucose meter kit and supplies KIT Dispense based on patient and insurance preference. Use up to four times daily as directed. (FOR ICD-9 250.00, 250.01). 1 each 0  . dolutegravir (TIVICAY) 50 MG tablet Take 50 mg by mouth daily.    Marland Kitchen emtricitabine-tenofovir AF (DESCOVY) 200-25 MG tablet Take 1 tablet by mouth daily. 30 tablet 11  . Nebulizers  (COMPRESSOR NEBULIZER) MISC 1 Device by Does not apply route every 6 (six) hours as needed. 1 each 0  . pantoprazole (PROTONIX) 20 MG tablet TAKE 1 TABLET(20 MG) BY MOUTH DAILY 30 tablet 0  . pantoprazole (PROTONIX) 20 MG tablet TAKE 1 TABLET(20 MG) BY MOUTH DAILY 30 tablet 5  . predniSONE (DELTASONE) 10 MG tablet TAKE 2 TABLETS BY MOUTH DAILY OR AS DIRECTED 120 tablet 2   No current facility-administered medications on file prior to visit.    Family History  Problem Relation Age of Onset  . Diabetes Maternal Grandmother   . Hypertension Maternal Grandmother    Social History   Social History  . Marital status: Single    Spouse name: N/A  . Number of children: N/A  . Years of education: N/A   Occupational History  . Not on file.   Social History Main Topics  . Smoking status: Never Smoker  . Smokeless tobacco: Never Used  . Alcohol use No  . Drug use: No  . Sexual activity: Not Currently     Comment: given condoms. Pt is an under.   Other Topics Concern  . Not on file   Social History Narrative  . No narrative on file    Review of Systems: Constitutional: Negative Skin: Negative HENT: Negative  Eyes: Negative  Neck: Negative Respiratory: Negative Cardiovascular: Negative Gastrointestinal: Negative Genitourinary: Negative  Musculoskeletal: Negative  Neurological: Negative for Hematological: Negative  Psychiatric/Behavioral: Negative    Objective:   Vitals:   02/03/16 0855  BP: 127/87  Pulse: 95  Resp: 18  Temp: 97.9 F (36.6 C)    Physical Exam: Constitutional: Patient appears well-developed and well-nourished. No distress. HENT: Normocephalic, atraumatic, External right and left ear normal. Oropharynx is clear and moist.  Eyes: Conjunctivae and EOM are normal. PERRLA, no scleral icterus. Neck: Normal ROM. Neck supple. No lymphadenopathy, No thyromegaly. CVS: RRR, S1/S2 +, no murmurs, no gallops, no rubs Pulmonary: Effort and breath sounds  normal, no stridor, rhonchi, wheezes, rales.  Abdominal: Soft. Normoactive BS,, no distension, tenderness, rebound or guarding.  Musculoskeletal: Normal range of motion. No edema and no tenderness.  Neuro: Alert.Normal muscle tone coordination. Non-focal Skin: Skin is warm and dry. No rash noted. Not diaphoretic. No erythema. No pallor. Psychiatric: Normal mood and affect. Behavior, judgment, thought content normal.  Lab Results  Component Value Date   WBC 9.9 09/16/2015   HGB 15.6 09/16/2015   HCT 46.0 09/16/2015   MCV 88.1 09/16/2015   PLT 225 09/16/2015   Lab Results  Component Value Date   CREATININE 1.08 10/20/2015   BUN 7 10/20/2015   NA 137 10/20/2015   K 4.8 10/20/2015   CL 101 10/20/2015   CO2 25 10/20/2015    Lab Results  Component Value Date   HGBA1C 5.5 02/03/2016   Lipid Panel     Component Value Date/Time   CHOL 151 10/20/2015 0923   TRIG 120 10/20/2015 0923   HDL 40 10/20/2015 0923   CHOLHDL 3.8 10/20/2015 0923   VLDL 24 10/20/2015 0923   LDLCALC 87 10/20/2015 0923        Assessment and plan:   1. Type 2 diabetes mellitus with complication, without long-term current use of insulin (H[facility])  - POCT A1C -Continue current medications.  2. Type 2 diabetes mellitus without complication, without long-term current use of insulin ([place])  - Ambulatory referral to Ophthalmology  3. Dysuria  - Urine culture  4. Hip pain -naproxen 500 mg #60, one po bid with food.    Return in about 6 months (around 08/02/2016).  The patient was given clear instructions to go to ER or return to medical center if symptoms don't improve, worsen or new problems develop. The patient verbalized understanding.    Micheline Chapman FNP  02/04/2016, 1:34 PM

## 2016-02-05 ENCOUNTER — Other Ambulatory Visit: Payer: Self-pay | Admitting: Family Medicine

## 2016-02-05 DIAGNOSIS — R3911 Hesitancy of micturition: Secondary | ICD-10-CM

## 2016-02-05 LAB — URINE CULTURE: ORGANISM ID, BACTERIA: NO GROWTH

## 2016-02-06 ENCOUNTER — Telehealth: Payer: Self-pay | Admitting: *Deleted

## 2016-02-06 NOTE — Telephone Encounter (Signed)
Patient called to advise that for about 1 month she has been having trouble when she urinates. She advised she feels as if she does not empty all the way and feels pressure and pain in her lower abdomen. She also advised she has swollen glands in her groin area and that this has happened before. She does not have a urologist and wants Dr Megan Salon to see her or call her in an antibiotic for this. Advised her will send the doctor a message to see what he recommends her to do. She does have a PCP and saw her recently and the issue was not addressed. Advised her will call her back soon.

## 2016-02-06 NOTE — Telephone Encounter (Signed)
Please have her come in and give [place] a urine sample for UA, culture, GC and Chlamydia screens. She can also get her CD4 count and viral load for her upcoming visit on 03/09/2016. I will call her once I have the results of the UA and culture.

## 2016-02-10 NOTE — Telephone Encounter (Signed)
Called the patient and she advised she has UA and urine culture last week and there was nothing. Advised she thinks her lymph glands are swollen and wants to know if he can do anything about that. Advised will ask the doctor and give her a call back once he answers.

## 2016-02-11 NOTE — Telephone Encounter (Signed)
I do not think she needs a urology referral. I will review her concerns about swollen lymph nodes at her upcoming visit.

## 2016-02-12 NOTE — Telephone Encounter (Signed)
Patient called and informed of response and advised she will probably go the ED or Urgent care as she can not wait until 03/09/16 she is in pain. Tried to schedule the patient sooner she refused.

## 2016-02-24 ENCOUNTER — Other Ambulatory Visit (HOSPITAL_COMMUNITY)
Admission: RE | Admit: 2016-02-24 | Discharge: 2016-02-24 | Disposition: A | Discharge status: Home / Self Care | Payer: Medicare Other | Source: Ambulatory Visit | Attending: Internal Medicine | Admitting: Internal Medicine

## 2016-02-24 ENCOUNTER — Other Ambulatory Visit: Payer: Medicare Other

## 2016-02-24 DIAGNOSIS — R339 Retention of urine, unspecified: Secondary | ICD-10-CM | POA: Diagnosis not present

## 2016-02-24 DIAGNOSIS — B20 Human immunodeficiency virus [HIV] disease: Secondary | ICD-10-CM

## 2016-02-24 DIAGNOSIS — Z113 Encounter for screening for infections with a predominantly sexual mode of transmission: Secondary | ICD-10-CM | POA: Diagnosis not present

## 2016-02-24 LAB — COMPREHENSIVE METABOLIC PANEL
ALK PHOS: 67 U/L (ref 40–115)
ALT: 14 U/L (ref 9–46)
AST: 33 U/L (ref 10–40)
Albumin: 3.8 g/dL (ref 3.6–5.1)
BUN: 17 mg/dL (ref 7–25)
CO2: 23 mmol/L (ref 20–31)
CREATININE: 1.17 mg/dL (ref 0.60–1.35)
Calcium: 9.3 mg/dL (ref 8.6–10.3)
Chloride: 103 mmol/L (ref 98–110)
GLUCOSE: 65 mg/dL (ref 65–99)
POTASSIUM: 4.4 mmol/L (ref 3.5–5.3)
SODIUM: 137 mmol/L (ref 135–146)
Total Bilirubin: 0.3 mg/dL (ref 0.2–1.2)
Total Protein: 7.9 g/dL (ref 6.1–8.1)

## 2016-02-24 LAB — CBC
HCT: 44 % (ref 38.5–50.0)
Hemoglobin: 14.7 g/dL (ref 13.2–17.1)
MCH: 29.9 pg (ref 27.0–33.0)
MCHC: 33.4 g/dL (ref 32.0–36.0)
MCV: 89.4 fL (ref 80.0–100.0)
MPV: 10.2 fL (ref 7.5–12.5)
PLATELETS: 256 10*3/uL (ref 140–400)
RBC: 4.92 MIL/uL (ref 4.20–5.80)
RDW: 15.3 % — AB (ref 11.0–15.0)
WBC: 7.4 10*3/uL (ref 3.8–10.8)

## 2016-02-25 LAB — T-HELPER CELL (CD4) - (RCID CLINIC ONLY)
CD4 % Helper T Cell: 23 % — ABNORMAL LOW (ref 33–55)
CD4 T Cell Abs: 570 /uL (ref 400–2700)

## 2016-02-25 LAB — PSA: PSA: 0.9 ng/mL (ref <=4.0)

## 2016-02-25 LAB — URINE CYTOLOGY ANCILLARY ONLY
CHLAMYDIA, DNA PROBE: NEGATIVE
Neisseria Gonorrhea: NEGATIVE

## 2016-02-26 LAB — RPR: RPR Ser Ql: REACTIVE — AB

## 2016-02-26 LAB — FLUORESCENT TREPONEMAL AB(FTA)-IGG-BLD: Fluorescent Treponemal ABS: REACTIVE — AB

## 2016-02-26 LAB — HIV-1 RNA QUANT-NO REFLEX-BLD

## 2016-02-26 LAB — RPR TITER: RPR Titer: 1:8 {titer}

## 2016-03-02 ENCOUNTER — Telehealth: Payer: Self-pay

## 2016-03-02 ENCOUNTER — Ambulatory Visit: Payer: Medicare Other | Admitting: Family Medicine

## 2016-03-02 NOTE — Telephone Encounter (Signed)
Received a fax from [facility] today. Patient has an appointment scheduled with [facility] for 04/01/2016 @12 :72 and patient is aware.

## 2016-03-09 ENCOUNTER — Other Ambulatory Visit: Payer: Self-pay | Admitting: Pharmacist

## 2016-03-09 ENCOUNTER — Encounter: Payer: Self-pay | Admitting: Internal Medicine

## 2016-03-09 ENCOUNTER — Encounter: Payer: Self-pay | Admitting: Pharmacist

## 2016-03-09 ENCOUNTER — Ambulatory Visit (INDEPENDENT_AMBULATORY_CARE_PROVIDER_SITE_OTHER): Payer: Medicare Other | Admitting: Internal Medicine

## 2016-03-09 VITALS — BP 133/90 | HR 102 | Temp 98.5°F | Ht 68.0 in | Wt 318.8 lb

## 2016-03-09 DIAGNOSIS — E118 Type 2 diabetes mellitus with unspecified complications: Secondary | ICD-10-CM

## 2016-03-09 DIAGNOSIS — A528 Late syphilis, latent: Secondary | ICD-10-CM

## 2016-03-09 DIAGNOSIS — B20 Human immunodeficiency virus [HIV] disease: Secondary | ICD-10-CM

## 2016-03-09 DIAGNOSIS — E099 Drug or chemical induced diabetes mellitus without complications: Secondary | ICD-10-CM | POA: Diagnosis present

## 2016-03-09 DIAGNOSIS — R109 Unspecified abdominal pain: Secondary | ICD-10-CM

## 2016-03-09 MED ORDER — METFORMIN HCL 500 MG PO TABS: 500.0000 mg | ORAL_TABLET | Freq: Two times a day (BID) | ORAL | 3 refills | Status: DC

## 2016-03-09 MED ORDER — PREDNISONE 10 MG PO TABS: ORAL_TABLET | ORAL | 2 refills | Status: DC

## 2016-03-09 NOTE — Assessment & Plan Note (Signed)
There is a drug drug interaction between Tivicay and metformin. We will need to decrease her metformin.

## 2016-03-09 NOTE — Assessment & Plan Note (Signed)
Her urinalysis has been normal. I do not know why she is having hesitancy or abdominal pain. She has normal renal function and normal liver enzymes. I will check an abdominal ultrasound.

## 2016-03-09 NOTE — Progress Notes (Signed)
[name] and I noticed that Robert Dawson was put on too high of a dose of metformin.  It interacts with her Tivicay and should be limited to 1000 mg total daily dose.  Contacted patient's PCP and alerted her that we will be decreasing the dose from 1,000 mg PO BID to 500 mg PO BID until we can watch her glucose and see how it reacts.  She was ok with that changed.  I sent in a new Rx for her metformin to [facility].

## 2016-03-09 NOTE — Assessment & Plan Note (Signed)
Her HIV infection is under excellent control. She will continue Descovy and Tivicay in follow-up after blood work in 6 months.

## 2016-03-09 NOTE — Assessment & Plan Note (Signed)
Her depression is worse. Had her meet with Curley Spice, our behavioral health counselor, today. She is scheduled a full visit with him later this week.

## 2016-03-09 NOTE — Progress Notes (Signed)
Patient Active Problem List   Diagnosis Date Noted  . Sarcoidosis of lung ([place]) 01/31/2014    Priority: High  . Human immunodeficiency virus (HIV) disease ([place]) 04/26/2006    Priority: High  . Depression 04/26/2006    Priority: Medium  . Abdominal pain 03/09/2016  . Diabetes ([place]) 10/03/2015  . 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 GERD (gastroesophageal reflux disease) 08/15/2014  . Pulmonary infiltrates 07/25/2014  . Bronchitis, chronic obstructive ([place]) 07/16/2014  . SOB (shortness of breath) 02/04/2014  . Syncope 02/04/2014  . Immunization due 01/31/2014  . Tachycardia 01/31/2014  . Acute bronchitis 10/03/2013  . Generalized swelling, mass, or lump of abdomen or pelvis 09/21/2013  . Other allergic rhinitis 09/21/2013  . Essential hypertension 09/21/2013  . Preventative health care 09/21/2013  . Wheezing 08/07/2013  . Peripheral neuropathy ([place]) 08/31/2010  . MOLE 10/14/2009  . SKIN RASH 10/14/2009  . MORBID OBESITY 04/22/2009  . 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 ALLERGIC RHINITIS 04/26/2006  . Hilar adenopathy 04/26/2006    Patient's Medications  New Prescriptions   No medications on file  Previous Medications   ALBUTEROL (PROAIR HFA) 108 (90 BASE) MCG/ACT INHALER    Inhale 2 puffs into the lungs every 6 (six) hours as needed for wheezing or shortness of breath.   ALBUTEROL (PROVENTIL) (2.5 MG/3ML) 0.083% NEBULIZER SOLUTION    Take 3 mLs (2.5 mg total) by nebulization every 4 (four) hours as needed for wheezing or shortness of breath.   BLOOD GLUCOSE METER KIT AND SUPPLIES KIT    Dispense based on patient and insurance preference. Use up to four times daily as directed. (FOR ICD-9 250.00, 250.01).   DOLUTEGRAVIR (TIVICAY) 50 MG TABLET    Take 50 mg by mouth daily.   EMTRICITABINE-TENOFOVIR AF (DESCOVY) 200-25 MG TABLET    Take 1 tablet by mouth daily.   GLIPIZIDE (GLUCOTROL) 10 MG  TABLET    Take 1 tablet (10 mg total) by mouth 2 (two) times daily before a meal.   LISINOPRIL (PRINIVIL,ZESTRIL) 5 MG TABLET    Take 1 tablet (5 mg total) by mouth daily.   NAPROXEN (NAPROSYN) 500 MG TABLET    Take 1 tablet (500 mg total) by mouth 2 (two) times daily with a meal.   NEBULIZERS (COMPRESSOR NEBULIZER) MISC    1 Device by Does not apply route every 6 (six) hours as needed.   PANTOPRAZOLE (PROTONIX) 20 MG TABLET    TAKE 1 TABLET(20 MG) BY MOUTH DAILY  Modified Medications   Modified Medication Previous Medication   METFORMIN (GLUCOPHAGE) 500 MG TABLET metFORMIN (GLUCOPHAGE) 1000 MG tablet      Take 1 tablet (500 mg total) by mouth 2 (two) times daily with a meal.    Take 1 tablet (1,000 mg total) by mouth 2 (two) times daily with a meal.   PREDNISONE (DELTASONE) 10 MG TABLET predniSONE (DELTASONE) 10 MG tablet      TAKE 1 TABLET BY MOUTH DAILY OR AS DIRECTED    TAKE 2 TABLETS BY MOUTH DAILY OR AS DIRECTED  Discontinued Medications   PANTOPRAZOLE (PROTONIX) 20 MG TABLET    TAKE 1 TABLET(20 MG) BY MOUTH DAILY    Subjective: Robert Dawson is in for her routine HIV follow-up visit. She denies missing any doses of her Descovy or Tivicay since her last visit. She is tolerating them well. 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 She is down to prednisone 10 mg daily for her sarcoidosis. She is feeling more depressed. She has not been sexually active with her former boyfriend or anyone else since her last visit when she was retreated for late latent syphilis. She has been having some abdominal pain and occasionally feels like she has trouble emptying her bladder. She denies any dysuria.   Review of Systems: Review of Systems  Constitutional: Positive for malaise/fatigue. Negative for chills, diaphoresis, fever and weight loss.  HENT: Negative for sore throat.   Respiratory: Negative for cough, sputum production and shortness of breath.   Cardiovascular: Negative for  chest pain.  Gastrointestinal: Positive for abdominal pain. Negative for diarrhea, heartburn, nausea and vomiting.  Genitourinary: Negative for dysuria and frequency.       As noted in history of present illness.  Musculoskeletal: Negative for joint pain and myalgias.  Skin: Negative for rash.  Neurological: Negative for dizziness and headaches.  Psychiatric/Behavioral: Positive for depression. Negative for substance abuse and suicidal ideas. The patient is not nervous/anxious.     Past Medical History:  Diagnosis Date  . Allergy   . Depression   . Diabetes ([place]) 10/03/2015  . HIV disease ([place])   . Immune deficiency disorder [facility])     Social History  Substance Use Topics  . Smoking status: Never Smoker  . Smokeless tobacco: Never Used  . Alcohol use No    Family History  Problem Relation Age of Onset  . Diabetes Maternal Grandmother   . Hypertension Maternal Grandmother     No Known Allergies  Objective:  Vitals:   03/09/16 1126  BP: 133/90  Pulse: (!) 102  Temp: 98.5 F (36.9 C)  TempSrc: Oral  Weight: (!) 318 lb 12 oz (144.6 kg)  Height: '5\' 8"'  (1.727 m)   Body mass index is 48.47 kg/m.  Physical Exam  Constitutional: He is oriented to person, place, and time.  She is worried and slightly anxious.  HENT:  Mouth/Throat: No oropharyngeal exudate.  Eyes: Conjunctivae are normal.  Cardiovascular: Normal rate and regular rhythm.   No murmur heard. Pulmonary/Chest: Breath sounds normal.  Abdominal: Soft. He exhibits no mass. There is no tenderness.  No CVA tenderness.  Musculoskeletal: Normal range of motion.  Neurological: He is alert and oriented to person, place, and time.  Skin: No rash noted.  Psychiatric: Mood and affect normal.    Lab Results Lab Results  Component Value Date   WBC 7.4 02/24/2016   HGB 14.7 02/24/2016   HCT 44.0 02/24/2016   MCV 89.4 02/24/2016   PLT 256 02/24/2016    Lab Results  Component Value Date   CREATININE 1.17  02/24/2016   BUN 17 02/24/2016   NA 137 02/24/2016   K 4.4 02/24/2016   CL 103 02/24/2016   CO2 23 02/24/2016    Lab Results  Component Value Date   ALT 14 02/24/2016   AST 33 02/24/2016   ALKPHOS 67 02/24/2016   BILITOT 0.3 02/24/2016    Lab Results  Component Value Date   CHOL 151 10/20/2015   HDL 40 10/20/2015   LDLCALC 87 10/20/2015   TRIG 120 10/20/2015   CHOLHDL 3.8 10/20/2015   HIV 1 RNA Quant (copies/mL)  Date Value  02/24/2016 <20  09/04/2015 <20  08/27/2014 <20   CD4 T Cell Abs (/uL)  Date Value  02/24/2016 570  09/04/2015 450  08/27/2014 340 (L)     Problem List Items Addressed This  Visit      High   Human immunodeficiency virus (HIV) disease ([place])    Her HIV infection is under excellent control. She will continue Descovy and Tivicay in follow-up after blood work in 6 months.      Relevant Orders   T-helper cell (CD4)- ([facility] clinic only)   HIV 1 RNA quant-no reflex-bld   CBC   Comprehensive metabolic panel   RPR     Unprioritized   Abdominal pain    Her urinalysis has been normal. I do not know why she is having hesitancy or abdominal pain. She has normal renal function and normal liver enzymes. I will check an abdominal ultrasound.      Relevant Orders   US Abdomen Complete   Diabetes ([place]) - Primary    There is a drug drug interaction between Tivicay and metformin. We will need to decrease her metformin.      SYPHILIS, LATE, LATENT    Her RPR has declined from 1:64 to 1:8 after treatment for late latent syphilis 6 months ago. She will get a repeat MRI in 6 months.           Michel Bickers, MD [facility] [place] [facility] [phone] pager   [phone] cell 03/09/2016, 12:13 PM

## 2016-03-09 NOTE — Assessment & Plan Note (Signed)
Her RPR has declined from 1:64 to 1:8 after treatment for late latent syphilis 6 months ago. She will get a repeat MRI in 6 months.

## 2016-03-11 ENCOUNTER — Telehealth: Payer: Self-pay | Admitting: *Deleted

## 2016-03-11 ENCOUNTER — Ambulatory Visit: Payer: Medicare Other

## 2016-03-11 NOTE — Telephone Encounter (Signed)
Left the patient a message to call [facility] to schedule her abdominal ultrasound that Dr Megan Salon ordered during her recent office visit.  Phone # [phone].

## 2016-03-11 NOTE — Telephone Encounter (Signed)
Left message with phone number to call to schedule ultrasound imaging at [facility].  They have had difficulty getting in touch with the patient.

## 2016-03-17 ENCOUNTER — Encounter: Payer: Self-pay | Admitting: Internal Medicine

## 2016-03-17 DIAGNOSIS — E119 Type 2 diabetes mellitus without complications: Secondary | ICD-10-CM | POA: Diagnosis not present

## 2016-03-17 DIAGNOSIS — H524 Presbyopia: Secondary | ICD-10-CM | POA: Diagnosis not present

## 2016-03-17 LAB — HM DIABETES EYE EXAM

## 2016-03-19 ENCOUNTER — Ambulatory Visit
Admission: RE | Admit: 2016-03-19 | Discharge: 2016-03-19 | Disposition: A | Discharge status: Home / Self Care | Payer: Medicare Other | Source: Ambulatory Visit | Attending: Internal Medicine | Admitting: Internal Medicine

## 2016-03-19 DIAGNOSIS — R109 Unspecified abdominal pain: Secondary | ICD-10-CM | POA: Diagnosis not present

## 2016-03-20 ENCOUNTER — Other Ambulatory Visit: Payer: Self-pay | Admitting: Internal Medicine

## 2016-03-20 DIAGNOSIS — B20 Human immunodeficiency virus [HIV] disease: Secondary | ICD-10-CM

## 2016-04-01 ENCOUNTER — Other Ambulatory Visit: Payer: Medicare Other

## 2016-04-01 ENCOUNTER — Encounter: Payer: Self-pay | Admitting: Internal Medicine

## 2016-04-01 ENCOUNTER — Ambulatory Visit (INDEPENDENT_AMBULATORY_CARE_PROVIDER_SITE_OTHER): Payer: Medicare Other | Admitting: Internal Medicine

## 2016-04-01 DIAGNOSIS — J449 Chronic obstructive pulmonary disease, unspecified: Secondary | ICD-10-CM | POA: Diagnosis not present

## 2016-04-01 DIAGNOSIS — D860 Sarcoidosis of lung: Secondary | ICD-10-CM

## 2016-04-01 DIAGNOSIS — K219 Gastro-esophageal reflux disease without esophagitis: Secondary | ICD-10-CM

## 2016-04-01 DIAGNOSIS — B20 Human immunodeficiency virus [HIV] disease: Secondary | ICD-10-CM | POA: Diagnosis not present

## 2016-04-01 LAB — COMPREHENSIVE METABOLIC PANEL
ALBUMIN: 4 g/dL (ref 3.6–5.1)
ALK PHOS: 78 U/L (ref 40–115)
ALT: 14 U/L (ref 9–46)
AST: 31 U/L (ref 10–40)
BILIRUBIN TOTAL: 0.4 mg/dL (ref 0.2–1.2)
BUN: 16 mg/dL (ref 7–25)
CO2: 26 mmol/L (ref 20–31)
CREATININE: 1.12 mg/dL (ref 0.60–1.35)
Calcium: 9.5 mg/dL (ref 8.6–10.3)
Chloride: 101 mmol/L (ref 98–110)
Glucose, Bld: 65 mg/dL (ref 65–99)
Potassium: 4.7 mmol/L (ref 3.5–5.3)
SODIUM: 138 mmol/L (ref 135–146)
TOTAL PROTEIN: 8 g/dL (ref 6.1–8.1)

## 2016-04-01 NOTE — Patient Instructions (Signed)
Reduce prednisone to 1/2 x 10 mg pill daily = 5 mg daily       OR   10 mg prednisone every other day  Referral information for Bariatric program to learn about weight loss help  Order- lab- ACE level    Dx sarcoid

## 2016-04-01 NOTE — Assessment & Plan Note (Signed)
I emphasized the importance of reflux precautions. Discussed available OTC Pepcid if needed.

## 2016-04-01 NOTE — Assessment & Plan Note (Signed)
We gave her contact information for bariatric surgery program

## 2016-04-01 NOTE — Assessment & Plan Note (Signed)
Last CXR was clear-significant improvement from when we began working with her. Residual nonspecific cough may reflect lisinopril, low-grade aspiration. Plan-reduce prednisone to 5 mg daily or 10 mg every other day, whichever she can manage most easily. ACE level.

## 2016-04-01 NOTE — Assessment & Plan Note (Signed)
Significantly improved. Nonspecific low-grade dry cough now they no longer reflect bronchitis as discussed. Watch response as prednisone dose is reduced. Continue reflux precautions.

## 2016-04-01 NOTE — Progress Notes (Signed)
HPI  male/male transgender never smoker followed for Sarcoid, interstitial infiltrates, cough, GERD/aspiration pneumonia,complicated by history syphilis, HIV,  depression, DM  -------------------------------------------------------  11/13/2015-41 year old male/male transgender never smoker followed for Sarcoid, complicated by history syphilis, HIV, followed for interstitial infiltrates, cough, GERD/aspiration pneumonia, depression, diabetes CXR 07/18/2015-NAD On prednisone blood sugars had become difficult to control requiring hospitalization. I communicated with Dr. Nicolette Bang about considering methotrexate as an alternative. Has continued prednisone 40 mg daily, sugar control with glipizide and metformin. Describes occasional minor dry coughing spells but no fever, night sweat or swollen glands.  12/30/2015-41 year old male/male transgender never smoker followed for Sarcoid, complicated by history syphilis, HIV, interstitial infiltrates, cough, GERD/aspiration pneumonia, depression, DM FOLLOWS FOR: Pt continues to have increased wheezing and continues Prednisone 10mg  2 tablets QD. We had begun prednisone reduction at last visit to see what minimum dose would be required to control symptoms. Hyperglycemia on steroids. ACE 11/17/15- > 100 H Concerned about possible UTI-lower abdominal pressure sensation and nocturia without burning or fever. Has not had gender change genital surgery. Only a little occasional wheeze since prednisone reduced. Uses nebulizer 3 times daily-helps. Occasional rescue inhaler. Continues Advair. Not coughing. Denies night sweats. CXR 11/17/2015     IMPRESSION: No edema or consolidation.  No demonstrable adenopathy. Electronically Signed   By: Lowella Grip III M.D.   On: 11/17/2015 12:02      04/01/2016- 41 year old male/male transgender never smoker followed for Sarcoid interstitial infiltrates, cough, GERD/aspiration pneumonia, complicated by history  syphilis, HIV,, depression, DM FOLLOWS FOR: pt states breathing has improved since last OV. pt c/o sob with exertion, prod cough with clear mucus & occ wheezing.  ACE 11/17/15- > 100 Has continued prednisone 10 mg daily Minor occasional cough and wheeze with little sputum. Throat tickle cough worse lying down. Off GERD medicine. Denies heartburn. Asks about weight loss intervention CXR 12/30/2015- IMPRESSION: No active cardiopulmonary disease.              ROS-see HPI   Negative unless "+" Constitutional:    weight loss, night sweats, fevers, chills, fatigue, lassitude. HEENT:    headaches, difficulty swallowing, tooth/dental problems, sore throat,       sneezing, itching, ear ache, nasal congestion, post nasal drip, snoring CV:    chest pain, orthopnea, PND, swelling in lower extremities, anasarca,                                                       dizziness, palpitations Resp:   + shortness of breath with exertion or at rest.                productive cough,   + non-productive cough, coughing up of blood.              change in color of mucus.  +wheezing.   Skin:    rash or lesions. GI:  No-   heartburn, indigestion, abdominal pain, nausea, vomiting,  GU: + HPI MS:   joint pain, stiffness, decreased range of motion, back pain. Neuro-     nothing unusual Psych:  No-change in mood or affect.  +depression or anxiety.   memory loss.  OBJ- Physical Exam  +presents as male, + obese General- Alert, Oriented, Affect-appropriate, Distress- none acute Skin- rash-none, lesions- none, excoriation- none, + tattoos Lymphadenopathy- L axilla puffy, tender Head- atraumatic  Eyes- Gross vision intact, PERRLA, conjunctivae and secretions clear            Ears- Hearing, canals-normal            Nose- Clear, no-Septal dev, mucus, polyps, erosion, perforation             Throat- Mallampati II , mucosa+tongue lightly coated , drainage- none, tonsils- atrophic Neck- flexible , trachea  midline, no stridor , thyroid nl, carotid no bruit Chest - symmetrical excursion , unlabored           Heart/CV- RRR , no murmur , no gallop  , no rub, nl s1 s2                           - JVD- none , edema- none, stasis changes- none, varices- none           Lung- Clear, unlabored, cough-none , dullness-none, rub- none           Chest wall-  Abd-  Br/ Gen/ Rectal- Not done, not indicated Extrem- cyanosis- none, clubbing, none, atrophy- none, strength- nl Neuro- grossly intact to observation

## 2016-04-02 ENCOUNTER — Other Ambulatory Visit: Payer: Medicare Other

## 2016-04-02 LAB — RPR TITER: RPR Titer: 1:8 {titer}

## 2016-04-02 LAB — HIV-1 RNA QUANT-NO REFLEX-BLD

## 2016-04-02 LAB — CBC

## 2016-04-02 LAB — FLUORESCENT TREPONEMAL AB(FTA)-IGG-BLD: Fluorescent Treponemal ABS: REACTIVE — AB

## 2016-04-02 LAB — RPR: RPR: REACTIVE — AB

## 2016-04-05 ENCOUNTER — Other Ambulatory Visit: Payer: Self-pay | Admitting: Internal Medicine

## 2016-04-05 DIAGNOSIS — R160 Hepatomegaly, not elsewhere classified: Secondary | ICD-10-CM | POA: Insufficient documentation

## 2016-04-12 ENCOUNTER — Telehealth: Payer: Self-pay | Admitting: *Deleted

## 2016-04-12 DIAGNOSIS — R160 Hepatomegaly, not elsewhere classified: Secondary | ICD-10-CM

## 2016-04-12 NOTE — Telephone Encounter (Signed)
Patient called to advise that she was contacted about a follow up MRI and wants to know why. She advised she was never told what her CT showed. Advised her will let the doctor know she has questions and call her back once he responds.

## 2016-04-12 NOTE — Telephone Encounter (Signed)
Please let her know that there was 1 small area in her liver that could not be adequately visualized on the ultrasound so the radiologist recommended the MRI.

## 2016-04-12 NOTE — Addendum Note (Signed)
Addended by: Landis Gandy on: 04/12/2016 12:06 PM   Modules accepted: Orders

## 2016-04-12 NOTE — Telephone Encounter (Signed)
MRI of abdomen with contrast needs no prior authorization per insurance.  Patient with medicare A/B.  RN contacted [place], they will contact patient to schedule. Landis Gandy, RN

## 2016-04-20 ENCOUNTER — Ambulatory Visit
Admission: RE | Admit: 2016-04-20 | Discharge: 2016-04-20 | Disposition: A | Discharge status: Home / Self Care | Payer: Medicare Other | Source: Ambulatory Visit | Attending: Internal Medicine | Admitting: Internal Medicine

## 2016-04-20 ENCOUNTER — Other Ambulatory Visit: Payer: Medicare Other

## 2016-04-20 DIAGNOSIS — K7689 Other specified diseases of liver: Secondary | ICD-10-CM | POA: Diagnosis not present

## 2016-04-20 DIAGNOSIS — R160 Hepatomegaly, not elsewhere classified: Secondary | ICD-10-CM

## 2016-04-20 MED ORDER — GADOXETATE DISODIUM 0.25 MMOL/ML IV SOLN
10.0000 mL | Freq: Once | INTRAVENOUS | Status: AC | PRN
  Administered 2016-04-20: 10 mL via INTRAVENOUS

## 2016-04-27 ENCOUNTER — Encounter: Payer: Self-pay | Admitting: Family Medicine

## 2016-04-27 ENCOUNTER — Ambulatory Visit (INDEPENDENT_AMBULATORY_CARE_PROVIDER_SITE_OTHER): Payer: Medicare Other | Admitting: Family Medicine

## 2016-04-27 ENCOUNTER — Telehealth: Payer: Self-pay

## 2016-04-27 ENCOUNTER — Other Ambulatory Visit: Payer: Self-pay | Admitting: Family Medicine

## 2016-04-27 VITALS — BP 144/99 | HR 95 | Temp 98.1°F | Resp 18 | Ht 68.0 in | Wt 324.4 lb

## 2016-04-27 DIAGNOSIS — E118 Type 2 diabetes mellitus with unspecified complications: Secondary | ICD-10-CM

## 2016-04-27 DIAGNOSIS — M25551 Pain in right hip: Secondary | ICD-10-CM

## 2016-04-27 DIAGNOSIS — T493X5S Adverse effect of emollients, demulcents and protectants, sequela: Secondary | ICD-10-CM

## 2016-04-27 DIAGNOSIS — R635 Abnormal weight gain: Secondary | ICD-10-CM

## 2016-04-27 DIAGNOSIS — R946 Abnormal results of thyroid function studies: Secondary | ICD-10-CM | POA: Diagnosis not present

## 2016-04-27 DIAGNOSIS — M25552 Pain in left hip: Secondary | ICD-10-CM

## 2016-04-27 DIAGNOSIS — E119 Type 2 diabetes mellitus without complications: Secondary | ICD-10-CM

## 2016-04-27 DIAGNOSIS — R3911 Hesitancy of micturition: Secondary | ICD-10-CM

## 2016-04-27 DIAGNOSIS — R82998 Other abnormal findings in urine: Secondary | ICD-10-CM | POA: Insufficient documentation

## 2016-04-27 DIAGNOSIS — R8299 Other abnormal findings in urine: Secondary | ICD-10-CM

## 2016-04-27 LAB — GLUCOSE, CAPILLARY: Glucose-Capillary: 88 mg/dL (ref 65–99)

## 2016-04-27 LAB — POCT URINALYSIS DIP (DEVICE)
Bilirubin Urine: NEGATIVE
Glucose, UA: NEGATIVE mg/dL
HGB URINE DIPSTICK: NEGATIVE
Ketones, ur: NEGATIVE mg/dL
Nitrite: NEGATIVE
PROTEIN: NEGATIVE mg/dL
UROBILINOGEN UA: 0.2 mg/dL (ref 0.0–1.0)
pH: 5.5 (ref 5.0–8.0)

## 2016-04-27 LAB — BASIC METABOLIC PANEL
BUN: 16 mg/dL (ref 7–25)
CHLORIDE: 103 mmol/L (ref 98–110)
CO2: 26 mmol/L (ref 20–31)
CREATININE: 1.27 mg/dL (ref 0.60–1.35)
Calcium: 9.4 mg/dL (ref 8.6–10.3)
GLUCOSE: 82 mg/dL (ref 65–99)
POTASSIUM: 4.4 mmol/L (ref 3.5–5.3)
Sodium: 138 mmol/L (ref 135–146)

## 2016-04-27 LAB — POCT GLYCOSYLATED HEMOGLOBIN (HGB A1C): Hemoglobin A1C: 5.1

## 2016-04-27 LAB — TSH: TSH: 6.9 mIU/L — ABNORMAL HIGH (ref 0.40–4.50)

## 2016-04-27 MED ORDER — METFORMIN HCL 500 MG PO TABS: 500.0000 mg | ORAL_TABLET | Freq: Every day | ORAL | 1 refills | Status: DC

## 2016-04-27 MED ORDER — GLIPIZIDE 5 MG PO TABS: 5.0000 mg | ORAL_TABLET | Freq: Every day | ORAL | 1 refills | Status: DC

## 2016-04-27 NOTE — Progress Notes (Signed)
Subjective:    Patient ID: Robert Dawson, male    DOB: 09-10-1975, 41 y.o.   MRN: [id number]  HPI Robert Dawson, is a 41 year old transgender male with a history of HIV and diabetes mellitus type 2 that presents for a follow up of diabetes mellitus and urinary hesitancy. She says that she has been taking Metformin and glipizide consistently. She is not exercising or following a low fat, low carbohydrate diet. She denies dizziness, fatigue, polyuria, polydipsia, or polyphagia.  She is also complaining of urinary hesitancy over the past several months. She was referred to [facility] in November. She had an appointment on January 4th, 2018, but she did not follow Robert Dawson is complaining of pain to hips bilaterally. Patient says that she received silicone injections to hips starting at age 50. She got the injections over a period of 7 years. She received the injections in a friend's basement. She says that hips have become painful and hard to touch over time. Previous images of pelvis was on 10/25/2013. Patient was lost to follow up following images. She says that pain is constant and aching. Current pain intensity is 5/10.  Past Medical History:  Diagnosis Date  . Allergy   . Depression   . Diabetes ([place]) 10/03/2015  . HIV disease ([place])   . Immune deficiency disorder ([place])    Review of Systems  Constitutional: Negative.   Eyes: Negative.  Negative for visual disturbance.  Respiratory: Negative.   Cardiovascular: Negative.   Gastrointestinal: Negative.   Endocrine: Negative for polydipsia and polyphagia.  Genitourinary: Negative for difficulty urinating, dysuria, hematuria and penile pain.       Urinary hesitancy  Musculoskeletal: Positive for myalgias.       Bilateral lower extremity edema  Skin: Negative.   Allergic/Immunologic: Positive for immunocompromised state (HIV +).  Neurological: Negative.   Hematological: Negative.        Objective:   Physical Exam   Constitutional:  Morbid obesity  HENT:  Head: Normocephalic and atraumatic.  Right Ear: External ear normal.  Left Ear: External ear normal.  Nose: Nose normal.  Mouth/Throat: Oropharynx is clear and moist.  Eyes: Conjunctivae and EOM are normal. Pupils are equal, round, and reactive to light.  Neck: Normal range of motion. Neck supple.  Abdominal: Soft. Bowel sounds are normal.  Increased abdominal girth  Skin: Skin is warm and dry.  Lateral thighs- Skin leathery, hyperpigmented, ecchymosis  Psychiatric: He has a normal mood and affect. His behavior is normal. Judgment and thought content normal.      BP (!) 144/99 (BP Location: Right Arm, Patient Position: Sitting, Cuff Size: Large)   Pulse 95   Temp 98.1 F (36.7 C) (Oral)   Resp 18   Ht 5\' 8"  (1.727 m)   Wt (!) 324 lb 6.4 oz (147.1 kg)   SpO2 98%   BMI 49.32 kg/m  Assessment & Plan:  1. Diabetes mellitus without complication ([place]) Hemoglobin a1C decreased to 5.1. Will decrease Metformin to 500 mg with breakfast and Glimperide to 5 mg daily. Will recheck a1C in 3 months and titrate medications further at that time.  - HgB A1c - metFORMIN (GLUCOPHAGE) 500 MG tablet; Take 1 tablet (500 mg total) by mouth daily with breakfast.  Dispense: 90 tablet; Refill: 1  2. Morbid obesity (H[facility])j Current BMI is 49. Will send referral to nutritional services. Discussed diet at length, patient requires further education.  - Basic Metabolic Panel - Ambulatory referral to diabetic  education  3. Urinary hesitancy Patient was previously referred to neurology for further work-up and evaluation by Robert Dawson in November. Patient missed urology appointment schedule don 04/01/2016. Called to schedule an appointment, given appt time. Discussed the importance of following up as scheduled. She expressed understanding.  - Basic Metabolic Panel  4. Weight gain Recommend a lowfat, low carbohydrate diet divided over 5-6 small meals, increase water  intake to 6-8 glasses, and 150 minutes per week of cardiovascular exercise.   - TSH  5. Urine leukocytes - Urine culture  6. Hip pain, bilateral Patient has a history of silicone injections to assist in transitioning from male to male. She received injections over a 7 year period. She says that the injections were administered by "a friend of a friend". Patient is not experiencing hardening of skin, hyperpigmentation and ecchymosis to skin tissue of upper thighs. [facility] cosmetics. Patient has a consult scheduled on May 12, 2016 at 3:15 with Dr. Iran Planas. I will defer for further work up and evaluation.   7. Adverse effect of silicone, sequela Refer to #6   RTC: 3 months for chronic conditions   Robert Dawson M, FNP    The patient was given clear instructions to go to ER or return to medical center if symptoms do not improve, worsen or new problems develop. The patient verbalized understanding. Will notify patient with laboratory results.

## 2016-04-27 NOTE — Telephone Encounter (Signed)
Patient was in office today, missed appointment for [facility] for 04/01/2016. I called and got this rescheduled for 06/02/2016 @1 :00pm. Patient was made aware in our office. Thanks!

## 2016-04-28 ENCOUNTER — Telehealth: Payer: Self-pay

## 2016-04-28 ENCOUNTER — Other Ambulatory Visit: Payer: Self-pay | Admitting: Family Medicine

## 2016-04-28 DIAGNOSIS — E039 Hypothyroidism, unspecified: Secondary | ICD-10-CM

## 2016-04-28 LAB — URINE CULTURE: ORGANISM ID, BACTERIA: NO GROWTH

## 2016-04-28 LAB — T3, FREE: T3, Free: 2.7 pg/mL (ref 2.3–4.2)

## 2016-04-28 MED ORDER — LEVOTHYROXINE SODIUM 100 MCG PO TABS: 100.0000 ug | ORAL_TABLET | Freq: Every day | ORAL | 0 refills | Status: DC

## 2016-04-28 NOTE — Telephone Encounter (Signed)
I have called and informed patient of appointment listed below. Thanks!    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 Dr. Iran Planas has agreed to a consult to discuss current issue resulting from silicone injections. The appointment is on Wednesday February 14th at 3:15.  [address], [place]  There is a $50 consult fee that she will need to bring to the appointment.

## 2016-04-29 ENCOUNTER — Ambulatory Visit: Payer: Medicare Other | Admitting: Family Medicine

## 2016-04-29 LAB — T4: T4 TOTAL: 8.2 ug/dL (ref 4.5–12.0)

## 2016-05-04 ENCOUNTER — Other Ambulatory Visit: Payer: Self-pay

## 2016-05-04 DIAGNOSIS — M25559 Pain in unspecified hip: Secondary | ICD-10-CM

## 2016-05-04 MED ORDER — NAPROXEN 500 MG PO TABS: 500.0000 mg | ORAL_TABLET | Freq: Two times a day (BID) | ORAL | 2 refills | Status: DC

## 2016-05-11 ENCOUNTER — Telehealth: Payer: Self-pay

## 2016-05-11 NOTE — Telephone Encounter (Signed)
F/u xray results. Please advise.     Patient is very concerned.   Laverle Patter, RN

## 2016-05-11 NOTE — Telephone Encounter (Signed)
IMPRESSION: Several small benign hepatic hemangiomas and cysts.  2 x 3 cm indeterminate lesion in the anterior left hepatic lobe. Recommend continued followup with abdomen MRI without and with contrast in 6 months.   Electronically Signed   By: Earle Gell M.D.   On: 04/21/2016 08:54  Robert Dawson is a recent liver ultrasound and MRI both showed indeterminate results. She has no clinical or biochemical evidence of liver problems. I do not think that there is any significant problem or that any further diagnostic evaluation is indicated at this time. Please let her know that we can consider a follow-up ultrasound in 6-12 months.

## 2016-05-13 ENCOUNTER — Ambulatory Visit: Payer: Medicare Other | Admitting: Internal Medicine

## 2016-05-20 ENCOUNTER — Encounter: Payer: Medicare Other | Attending: Family Medicine | Admitting: Registered"

## 2016-05-20 DIAGNOSIS — E118 Type 2 diabetes mellitus with unspecified complications: Secondary | ICD-10-CM | POA: Insufficient documentation

## 2016-05-20 DIAGNOSIS — Z713 Dietary counseling and surveillance: Secondary | ICD-10-CM | POA: Insufficient documentation

## 2016-05-20 DIAGNOSIS — IMO0001 Reserved for inherently not codable concepts without codable children: Secondary | ICD-10-CM

## 2016-05-20 NOTE — Progress Notes (Signed)
Medical Nutrition Therapy:  Appt start time: 1400 end time:  1500.   Assessment:  Primary concerns today: Understanding and controlling Diabetes and weight loss. Patient reports that she doesn't like how it is hard to breath and being fatigue, A1c 5.1 3 mos ago; recent SMBG 105-160 mg/dL. She report she has stopped eating friend food and now uses an air fryer to make fried chicken and french fries. She states interest in bariatric services but also interested in learning how to eat better.  Preferred Learning Style:   No preference indicated   Learning Readiness:   Ready  MEDICATIONS: reviewed   DIETARY INTAKE:  Usual eating pattern includes 2 meals and 1-2 snacks per day.  Everyday foods include Fruits,.  Avoided foods include meat and starch "fattening" foods.    24-hr recall:   B ( AM): bacon or eggs & grits toast  Snk ( AM): none  L ( PM): chinese food OR burger, fries OR meat, potato Snk ( PM): fruit D ( PM): none Snk ( PM): none Beverages: Oj (minute maid), stopped drinking sodas last year  Usual physical activity: ADLs. used to walk a lot, but doesn't feel safe in current neighborhood.   Estimated energy needs: 1800 calories 200 g carbohydrates 135 g protein 50 g fat  Progress Towards Goal(s):  In progress.   Nutritional Diagnosis:  -3.3 Overweight/obesity As related to history of fried foods and excess calories.  As evidenced by patient reported past eating habits.    Intervention:  Nutrition Education. Nutrition education for managing blood glucose with diet and lifestyle changes. Described diabetes. Stated some common risk factors for diabetes.  Defined the role of glucose and insulin.  Identified type of diabetes and pathophysiology. Described the role of different macronutrients on glucose.  Explained how carbohydrates affect blood glucose.  Stated what foods contain the most carbohydrates.  Demonstrated carbohydrate counting.  Demonstrated how to read  Nutrition Facts food label.  Plan: Consider other ways to get in some physical activity such as walking at the mall. Arm chair exercises Include protein with meals snack, fish, bean and nuts are great in addition to other lean meats. Aim for a handful of nuts every day Walnut and almonds Try getting more whole grains in your diet For frozen dinners Healthy Choice "simply" is a decent choice  Teaching Method Utilized:  Visual Auditory Hands on  Handouts given during visit include:  Living Well with Diabetes  Carb Counting and Food Label handouts  Meal Plan Card  A1c handout  Portion Plate  Snack Suggestions  Barriers to learning/adherence to lifestyle change: limited understanding of medical instruction  Demonstrated degree of understanding via:  Teach Back   Monitoring/Evaluation:  Dietary intake, exercise, and body weight in 1 month(s).

## 2016-05-20 NOTE — Patient Instructions (Addendum)
Consider other ways to get in some physical activity such as walking at the mall. Arm chair exercises Include protein with meals snack, fish, bean and nuts are great in addition to other lean meats. Aim for a handful of nuts every day Walnut and almonds Try getting more whole grains in your diet For frozen dinners Healthy Choice "simply" is a decent choice

## 2016-06-02 DIAGNOSIS — R102 Pelvic and perineal pain: Secondary | ICD-10-CM | POA: Diagnosis not present

## 2016-06-02 DIAGNOSIS — R3915 Urgency of urination: Secondary | ICD-10-CM | POA: Diagnosis not present

## 2016-06-08 ENCOUNTER — Ambulatory Visit: Payer: Medicare Other | Admitting: Internal Medicine

## 2016-06-11 ENCOUNTER — Other Ambulatory Visit: Payer: Self-pay | Admitting: Family Medicine

## 2016-06-11 ENCOUNTER — Other Ambulatory Visit (INDEPENDENT_AMBULATORY_CARE_PROVIDER_SITE_OTHER): Payer: Medicare Other

## 2016-06-11 DIAGNOSIS — E039 Hypothyroidism, unspecified: Secondary | ICD-10-CM

## 2016-06-12 ENCOUNTER — Other Ambulatory Visit: Payer: Self-pay | Admitting: Family Medicine

## 2016-06-12 DIAGNOSIS — E039 Hypothyroidism, unspecified: Secondary | ICD-10-CM

## 2016-06-12 LAB — TSH: TSH: 1.26 mIU/L (ref 0.40–4.50)

## 2016-06-14 ENCOUNTER — Other Ambulatory Visit: Payer: Self-pay

## 2016-06-14 DIAGNOSIS — E118 Type 2 diabetes mellitus with unspecified complications: Secondary | ICD-10-CM

## 2016-06-14 MED ORDER — GLIPIZIDE 5 MG PO TABS: 5.0000 mg | ORAL_TABLET | Freq: Every day | ORAL | 1 refills | Status: DC

## 2016-06-15 ENCOUNTER — Encounter: Payer: Self-pay | Admitting: Family Medicine

## 2016-06-15 ENCOUNTER — Ambulatory Visit (HOSPITAL_COMMUNITY)
Admission: RE | Admit: 2016-06-15 | Discharge: 2016-06-15 | Disposition: A | Discharge status: Home / Self Care | Payer: Medicare Other | Source: Ambulatory Visit | Attending: Family Medicine | Admitting: Family Medicine

## 2016-06-15 ENCOUNTER — Ambulatory Visit (INDEPENDENT_AMBULATORY_CARE_PROVIDER_SITE_OTHER): Payer: Medicare Other | Admitting: Family Medicine

## 2016-06-15 ENCOUNTER — Other Ambulatory Visit: Payer: Self-pay

## 2016-06-15 VITALS — BP 144/78 | HR 89 | Temp 98.3°F | Resp 18 | Ht 68.0 in | Wt 323.0 lb

## 2016-06-15 DIAGNOSIS — R918 Other nonspecific abnormal finding of lung field: Secondary | ICD-10-CM | POA: Diagnosis not present

## 2016-06-15 DIAGNOSIS — R062 Wheezing: Secondary | ICD-10-CM | POA: Insufficient documentation

## 2016-06-15 DIAGNOSIS — J4520 Mild intermittent asthma, uncomplicated: Secondary | ICD-10-CM

## 2016-06-15 DIAGNOSIS — R05 Cough: Secondary | ICD-10-CM

## 2016-06-15 DIAGNOSIS — E118 Type 2 diabetes mellitus with unspecified complications: Secondary | ICD-10-CM

## 2016-06-15 DIAGNOSIS — R053 Chronic cough: Secondary | ICD-10-CM

## 2016-06-15 MED ORDER — MONTELUKAST SODIUM 10 MG PO TABS: 10.0000 mg | ORAL_TABLET | Freq: Every day | ORAL | 11 refills | Status: DC

## 2016-06-15 MED ORDER — GLIPIZIDE 5 MG PO TABS: 5.0000 mg | ORAL_TABLET | Freq: Every day | ORAL | 1 refills | Status: DC

## 2016-06-15 MED ORDER — IPRATROPIUM BROMIDE 0.02 % IN SOLN
0.5000 mg | Freq: Once | RESPIRATORY_TRACT | Status: AC
  Administered 2016-06-15: 0.5 mg via RESPIRATORY_TRACT

## 2016-06-15 MED ORDER — BENZONATATE 100 MG PO CAPS: 100.0000 mg | ORAL_CAPSULE | Freq: Three times a day (TID) | ORAL | 0 refills | Status: DC | PRN

## 2016-06-15 MED ORDER — ALBUTEROL SULFATE HFA 108 (90 BASE) MCG/ACT IN AERS: 2.0000 | INHALATION_SPRAY | Freq: Four times a day (QID) | RESPIRATORY_TRACT | 1 refills | Status: DC | PRN

## 2016-06-15 MED ORDER — ALBUTEROL SULFATE (2.5 MG/3ML) 0.083% IN NEBU
2.5000 mg | INHALATION_SOLUTION | Freq: Once | RESPIRATORY_TRACT | Status: AC
  Administered 2016-06-15: 2.5 mg via RESPIRATORY_TRACT

## 2016-06-15 NOTE — Addendum Note (Signed)
Addended by: Adelina Mings on: 06/15/2016 03:20 PM   Modules accepted: Orders

## 2016-06-15 NOTE — Patient Instructions (Addendum)
Patient received Duo nebulizer treatment in office.  Will send for a chest xray following visit.  Will restart Singulair 10 mg at night.  Increase water intake to 6-8 glasses daily Will follow up by phone with chest xray results and further care plan instructions.   Recommend a lowfat, low carbohydrate diet divided over 5-6 small meals, increase water intake to 6-8 glasses, and 150 minutes per week of cardiovascular exercise.   Asthma, Adult Asthma is a recurring condition in which the airways tighten and narrow. Asthma can make it difficult to breathe. It can cause coughing, wheezing, and shortness of breath. Asthma episodes, also called asthma attacks, range from minor to life-threatening. Asthma cannot be cured, but medicines and lifestyle changes can help control it. What are the causes? Asthma is believed to be caused by inherited (genetic) and environmental factors, but its exact cause is unknown. Asthma may be triggered by allergens, lung infections, or irritants in the air. Asthma triggers are different for each person. Common triggers include:  Animal dander.  Dust mites.  Cockroaches.  Pollen from trees or grass.  Mold.  Smoke.  Air pollutants such as dust, household cleaners, hair sprays, aerosol sprays, paint fumes, strong chemicals, or strong odors.  Cold air, weather changes, and winds (which increase molds and pollens in the air).  Strong emotional expressions such as crying or laughing hard.  Stress.  Certain medicines (such as aspirin) or types of drugs (such as beta-blockers).  Sulfites in foods and drinks. Foods and drinks that may contain sulfites include dried fruit, potato chips, and sparkling grape juice.  Infections or inflammatory conditions such as the flu, a cold, or an inflammation of the nasal membranes (rhinitis).  Gastroesophageal reflux disease (GERD).  Exercise or strenuous activity. What are the signs or symptoms? Symptoms may occur  immediately after asthma is triggered or many hours later. Symptoms include:  Wheezing.  Excessive nighttime or early morning coughing.  Frequent or severe coughing with a common cold.  Chest tightness.  Shortness of breath. How is this diagnosed? The diagnosis of asthma is made by a review of your medical history and a physical exam. Tests may also be performed. These may include:  Lung function studies. These tests show how much air you breathe in and out.  Allergy tests.  Imaging tests such as X-rays. How is this treated? Asthma cannot be cured, but it can usually be controlled. Treatment involves identifying and avoiding your asthma triggers. It also involves medicines. There are 2 classes of medicine used for asthma treatment:  Controller medicines. These prevent asthma symptoms from occurring. They are usually taken every day.  Reliever or rescue medicines. These quickly relieve asthma symptoms. They are used as needed and provide short-term relief. Your health care provider will help you create an asthma action plan. An asthma action plan is a written plan for managing and treating your asthma attacks. It includes a list of your asthma triggers and how they may be avoided. It also includes information on when medicines should be taken and when their dosage should be changed. An action plan may also involve the use of a device called a peak flow meter. A peak flow meter measures how well the lungs are working. It helps you monitor your condition. Follow these instructions at home:  Take medicines only as directed by your health care provider. Speak with your health care provider if you have questions about how or when to take the medicines.  Use a peak  flow meter as directed by your health care provider. Record and keep track of readings.  Understand and use the action plan to help minimize or stop an asthma attack without needing to seek medical care.  Control your home  environment in the following ways to help prevent asthma attacks:  Do not smoke. Avoid being exposed to secondhand smoke.  Change your heating and air conditioning filter regularly.  Limit your use of fireplaces and wood stoves.  Get rid of pests (such as roaches and mice) and their droppings.  Throw away plants if you see mold on them.  Clean your floors and dust regularly. Use unscented cleaning products.  Try to have someone else vacuum for you regularly. Stay out of rooms while they are being vacuumed and for a short while afterward. If you vacuum, use a dust mask from a hardware store, a double-layered or microfilter vacuum cleaner bag, or a vacuum cleaner with a HEPA filter.  Replace carpet with wood, tile, or vinyl flooring. Carpet can trap dander and dust.  Use allergy-proof pillows, mattress covers, and box spring covers.  Wash bed sheets and blankets every week in hot water and dry them in a dryer.  Use blankets that are made of polyester or cotton.  Clean bathrooms and kitchens with bleach. If possible, have someone repaint the walls in these rooms with mold-resistant paint. Keep out of the rooms that are being cleaned and painted.  Wash hands frequently. Contact a health care provider if:  You have wheezing, shortness of breath, or a cough even if taking medicine to prevent attacks.  The colored mucus you cough up (sputum) is thicker than usual.  Your sputum changes from clear or white to yellow, green, gray, or bloody.  You have any problems that may be related to the medicines you are taking (such as a rash, itching, swelling, or trouble breathing).  You are using a reliever medicine more than 2-3 times per week.  Your peak flow is still at 50-79% of your personal best after following your action plan for 1 hour.  You have a fever. Get help right away if:  You seem to be getting worse and are unresponsive to treatment during an asthma attack.  You are short  of breath even at rest.  You get short of breath when doing very little physical activity.  You have difficulty eating, drinking, or talking due to asthma symptoms.  You develop chest pain.  You develop a fast heartbeat.  You have a bluish color to your lips or fingernails.  You are light-headed, dizzy, or faint.  Your peak flow is less than 50% of your personal best. This information is not intended to replace advice given to you by your health care provider. Make sure you discuss any questions you have with your health care provider. Document Released: 03/15/2005 Document Revised: 08/27/2015 Document Reviewed: 10/12/2012 Elsevier Interactive Patient Education  2017 [employer].

## 2016-06-15 NOTE — Progress Notes (Signed)
Subjective:    Patient ID: Robert Dawson, male    DOB: Jun 30, 1975, 41 y.o.   MRN: [id number] [name], a 41 year old transgender male with a history of HIV, DMII, hypothyroidism, obesity, and intermittent asthma. She says that she has had a persistent cough for the past 4 weeks. She has attempted OTC antihistamines without relief.  Cough  This is a new problem. The current episode started 1 to 4 weeks ago. The problem has been gradually worsening. The problem occurs constantly. The cough is productive of sputum. Associated symptoms include nasal congestion. Pertinent negatives include no chest pain, chills, ear congestion, ear pain, fever, rhinorrhea, weight loss or wheezing. Treatments tried: Antihistamine without relief. His past medical history is significant for asthma, bronchitis and environmental allergies. There is no history of bronchiectasis, COPD, emphysema or pneumonia.   Past Medical History:  Diagnosis Date  . Allergy   . Depression   . Diabetes ([place]) 10/03/2015  . HIV disease ([place])   . Immune deficiency disorder [facility])    Social History   Social History  . Marital status: Single    Spouse name: N/A  . Number of children: N/A  . Years of education: N/A   Occupational History  . Not on file.   Social History Main Topics  . Smoking status: Never Smoker  . Smokeless tobacco: Never Used  . Alcohol use No  . Drug use: No  . Sexual activity: Not Currently     Comment: given condoms. Pt is an under.   Other Topics Concern  . Not on file   Social History Narrative  . No narrative on file   No Known Allergies  Immunization History  Administered Date(s) Administered  . H1N1 05/09/2008  . Hepatitis B 04/22/2004, 08/04/2004, 10/27/2004  . Influenza Split 01/14/2011  . Influenza Whole 04/26/2006, 05/09/2008, 04/22/2009, 06/16/2010  . Influenza,inj,Quad PF,36+ Mos 12/12/2012, 01/31/2014, 12/30/2015  . Pneumococcal Polysaccharide-23 11/21/2003, 04/22/2009   . Tdap 01/31/2014   Review of Systems  Constitutional: Negative.  Negative for chills, fever and weight loss.  HENT: Negative for ear pain and rhinorrhea.   Eyes: Negative.   Respiratory: Positive for cough. Negative for wheezing.   Cardiovascular: Negative for chest pain.  Gastrointestinal: Negative.   Genitourinary: Negative.   Musculoskeletal: Negative.   Skin: Negative.   Allergic/Immunologic: Positive for environmental allergies.  Neurological: Negative.   Hematological: Negative.   Psychiatric/Behavioral: Negative.        Objective:   Physical Exam  Constitutional: He is oriented to person, place, and time. He appears well-developed and well-nourished.  HENT:  Head: Normocephalic and atraumatic.  Right Ear: External ear normal.  Left Ear: External ear normal.  Nose: Nose normal.  Mouth/Throat: Oropharynx is clear and moist.  Eyes: Conjunctivae and EOM are normal. Pupils are equal, round, and reactive to light.  Neck: Normal range of motion.  Cardiovascular: Normal rate, regular rhythm, normal heart sounds and intact distal pulses.   Pulmonary/Chest: Effort normal. He has no decreased breath sounds. He has wheezes in the right upper field. He has no rhonchi. He has no rales.  Abdominal: Soft. Bowel sounds are normal.  Musculoskeletal: Normal range of motion.  Neurological: He is alert and oriented to person, place, and time. He has normal reflexes.  Skin: Skin is warm and dry.  Psychiatric: He has a normal mood and affect. His behavior is normal. Judgment and thought content normal.     BP (!) 144/78 (BP Location: Left Arm, Patient  Position: Sitting, Cuff Size: Normal)   Pulse 89   Temp 98.3 F (36.8 C) (Oral)   Resp 18   Ht 5\' 8"  (1.727 m)   Wt (!) 323 lb (146.5 kg)   SpO2 97%   BMI 49.11 kg/m  Assessment & Plan:  1. Wheezing Wheezing improved post nebulizer treatment.  - DG Chest 2 View; Future  2. Mild intermittent asthma without complication  -  albuterol (PROAIR HFA) 108 (90 Base) MCG/ACT inhaler; Inhale 2 puffs into the lungs every 6 (six) hours as needed for wheezing or shortness of breath.  Dispense: 1 Inhaler; Refill: 1 - montelukast (SINGULAIR) 10 MG tablet; Take 1 tablet (10 mg total) by mouth at bedtime.  Dispense: 30 tablet; Refill: 11 - DG Chest 2 View; Future  3. Persistent cough  - benzonatate (TESSALON PERLES) 100 MG capsule; Take 1 capsule (100 mg total) by mouth 3 (three) times daily as needed for cough.  Dispense: 30 capsule; Refill: 0   RTC: As previously scheduled on Jul 27, 2016 for chronic conditions   [place], MSN, FNP-C

## 2016-06-15 NOTE — Telephone Encounter (Signed)
Refill for glipizide sent into pharmacy. Thanks!

## 2016-06-16 ENCOUNTER — Other Ambulatory Visit: Payer: Self-pay | Admitting: Family Medicine

## 2016-06-16 DIAGNOSIS — J449 Chronic obstructive pulmonary disease, unspecified: Secondary | ICD-10-CM

## 2016-06-16 MED ORDER — BECLOMETHASONE DIPROP HFA 40 MCG/ACT IN AERB: 1.0000 | INHALATION_SPRAY | Freq: Every day | RESPIRATORY_TRACT | 1 refills | Status: DC

## 2016-06-17 ENCOUNTER — Ambulatory Visit: Payer: Medicare Other | Admitting: Registered"

## 2016-06-17 ENCOUNTER — Other Ambulatory Visit: Payer: Self-pay

## 2016-06-17 DIAGNOSIS — E118 Type 2 diabetes mellitus with unspecified complications: Secondary | ICD-10-CM

## 2016-06-17 MED ORDER — GLIPIZIDE 5 MG PO TABS: 5.0000 mg | ORAL_TABLET | Freq: Every day | ORAL | 1 refills | Status: DC

## 2016-06-17 NOTE — Telephone Encounter (Signed)
Refill for Glipizide sent into pharmacy. Thanks!

## 2016-07-05 ENCOUNTER — Ambulatory Visit: Payer: Medicare Other | Admitting: Internal Medicine

## 2016-07-27 ENCOUNTER — Encounter: Payer: Self-pay | Admitting: Family Medicine

## 2016-07-27 ENCOUNTER — Ambulatory Visit (INDEPENDENT_AMBULATORY_CARE_PROVIDER_SITE_OTHER): Payer: Medicare Other | Admitting: Family Medicine

## 2016-07-27 VITALS — BP 141/88 | HR 78 | Temp 98.2°F | Resp 18 | Ht 68.0 in | Wt 323.0 lb

## 2016-07-27 DIAGNOSIS — E119 Type 2 diabetes mellitus without complications: Secondary | ICD-10-CM | POA: Diagnosis not present

## 2016-07-27 DIAGNOSIS — F4522 Body dysmorphic disorder: Secondary | ICD-10-CM

## 2016-07-27 DIAGNOSIS — I10 Essential (primary) hypertension: Secondary | ICD-10-CM

## 2016-07-27 DIAGNOSIS — R635 Abnormal weight gain: Secondary | ICD-10-CM | POA: Diagnosis not present

## 2016-07-27 LAB — POCT URINALYSIS DIP (DEVICE)
BILIRUBIN URINE: NEGATIVE
Glucose, UA: NEGATIVE mg/dL
HGB URINE DIPSTICK: NEGATIVE
KETONES UR: NEGATIVE mg/dL
Leukocytes, UA: NEGATIVE
Nitrite: NEGATIVE
PH: 6 (ref 5.0–8.0)
Protein, ur: NEGATIVE mg/dL
SPECIFIC GRAVITY, URINE: 1.02 (ref 1.005–1.030)
Urobilinogen, UA: 0.2 mg/dL (ref 0.0–1.0)

## 2016-07-27 LAB — POCT GLYCOSYLATED HEMOGLOBIN (HGB A1C): Hemoglobin A1C: 5.5

## 2016-07-27 NOTE — Progress Notes (Signed)
Subjective:    Patient ID: Robert Dawson, male    DOB: [birth year], 41 y.o.   MRN: [id number] Robert Dawson, a 41 year old transgender MTF with a history of HIV, DMII, hypothyroidism, obesity, and intermittent asthma presents for a follow up. She is complaining of weight gain and persistent cough. She says that she has had a persistent cough for the past 4 weeks. She has attempted OTC antihistamines without relief. She describes cough as dry and hacking. She is on Lisinopril daily for hypertension. She endorses periodic shortness of breath. She uses Q-Var as prescribed and is followed by pulmonologist for sarcoidosis. She denies chest pain, fever, fatigue, nausea, vomiting, or diarrhea.    Kim's primary concern is weight gain. Patient has had a 70 pound weight gain since 2 years ago. She has been referred to nutrition and diabetes management and has had diet plans without success. She has been unable to tolerate exercise due to sarcoidosis. She is requesting bariatric surgery.   Obesity: Patient complains of obesity. Patient cites breathing difficulty and chronic conditions as reasons for wanting to lose weight.  Obesity History Weight in late teens: 120 lb. Period of greatest weight gain: 373 lb during 30's Lowest adult weight: 125 Highest adult weight: 373    History of Weight Loss Efforts Greatest amount of weight lost: 50 lb over  several months Amount of time that loss was maintained:  5 months Circumstances associated with regain of weight: Starting prednisone Successful weight loss techniques attempted: very low calorie diet   Current Exercise Habits none  Current Eating Habits Number of regular meals per day: 1 Number of snacking episodes per day: 0 Who shops for food? She shops for food.  Who prepares food? She prepares food Who eats with patient? Friends, brother and cousin Binge behavior?: Yes Purge behavior? No Eating precipitated by stress? Sometimes Guilt  feelings associated with eating? Yes  Other Potential Contributing Factors Use of alcohol: average: No Use of medications that may cause weight gain corticosteroids (prednisone) History of past abuse? none Psych History: anxiety, tobacco use and body dysmorphic disorder Comorbidities: diabetes mellitus and dyslipidemias   HPI Past Medical History:  Diagnosis Date  . Allergy   . Depression   . Diabetes ([place]) 10/03/2015  . HIV disease ([place])   . Immune deficiency disorder [facility])    Social History   Social History  . Marital status: Single    Spouse name: N/A  . Number of children: N/A  . Years of education: N/A   Occupational History  . Not on file.   Social History Main Topics  . Smoking status: Never Smoker  . Smokeless tobacco: Never Used  . Alcohol use No  . Drug use: No  . Sexual activity: Not Currently     Comment: given condoms. Pt is an under.   Other Topics Concern  . Not on file   Social History Narrative  . No narrative on file   No Known Allergies  Immunization History  Administered Date(s) Administered  . H1N1 05/09/2008  . Hepatitis B 04/22/2004, 08/04/2004, 10/27/2004  . Influenza Split 01/14/2011  . Influenza Whole 04/26/2006, 05/09/2008, 04/22/2009, 06/16/2010  . Influenza,inj,Quad PF,36+ Mos 12/12/2012, 01/31/2014, 12/30/2015  . Pneumococcal Polysaccharide-23 11/21/2003, 04/22/2009  . Tdap 01/31/2014   Review of Systems  Constitutional: Positive for unexpected weight change (weight gain). Negative for chills, fever and weight loss.  HENT: Negative for ear pain and rhinorrhea.   Eyes: Negative.   Respiratory: Positive for  cough. Negative for wheezing.   Cardiovascular: Negative for chest pain.  Gastrointestinal: Negative.   Endocrine: Negative for polydipsia, polyphagia and polyuria.  Genitourinary: Negative.   Musculoskeletal: Negative.   Skin: Negative.   Allergic/Immunologic: Positive for environmental allergies and immunocompromised  state (HIV +).  Neurological: Negative.   Hematological: Negative.   Psychiatric/Behavioral: Negative.       Objective:   Physical Exam  Constitutional: He is oriented to person, place, and time. He appears well-developed and well-nourished.  Morbid obesity  HENT:  Head: Normocephalic and atraumatic.  Right Ear: External ear normal.  Left Ear: External ear normal.  Nose: Nose normal.  Mouth/Throat: Oropharynx is clear and moist.  Eyes: Conjunctivae and EOM are normal. Pupils are equal, round, and reactive to light.  Neck: Normal range of motion.  Cardiovascular: Normal rate, regular rhythm, normal heart sounds and intact distal pulses.   Pulmonary/Chest: Effort normal. He has no decreased breath sounds. He has no wheezes. He has no rhonchi. He has no rales.  Abdominal: Soft. Bowel sounds are normal.  Increased abdominal girth  Musculoskeletal: Normal range of motion.  Neurological: He is alert and oriented to person, place, and time. He has normal reflexes.  Skin: Skin is warm and dry.  Psychiatric: He has a normal mood and affect. His behavior is normal. Judgment and thought content normal.     BP (!) 141/88 (BP Location: Right Arm, Patient Position: Sitting, Cuff Size: Large)   Pulse 78   Temp 98.2 F (36.8 C) (Oral)   Resp 18   Ht 5\' 8"  (1.727 m)   Wt (!) 323 lb (146.5 kg)   SpO2 96%   BMI 49.11 kg/m  Assessment & Plan:   1. Morbid obesity ([place]) Patient has had a 70 pound weight gain over the past 2 years. She is requesting bariatric surgery. I will place a referral for a consult. I discussed that she may not be a candidate due to chronic conditions.  She is to continue to follow with diabetes and nutrition. Recommend a lowfat, low carbohydrate diet divided over 5-6 small meals, increase water intake to 6-8 glasses, and increase daily activity. I will schedule a consult with bariatric surgery. I also suggest that she registers for a weight loss seminar at [facility].     2. Body dysmorphic disorder Kim and I discussed her body at length. I recommend that she re-establish with psychiatry for further evaluation.  - Ambulatory referral to Psychiatry  3. Diabetes mellitus without complication (HCC) - HgB A1c - Microalbumin/Creatinine Ratio, Urine  4. Essential hypertension Discontinued Lisinopril due to persistent cough. Will follow up in 1 month for hypertension.  - amLODipine (NORVASC) 5 MG tablet; Take 1 tablet (5 mg total) by mouth daily.  Dispense: 30 tablet; Refill: 5  5. Weight gain Refer to #1   RTC: Follow up in 1 month for hypertension    Donia Pounds  MSN, FNP-C [facility] [place], [place] [address] [address] [facility], MSN, FNP-C

## 2016-07-27 NOTE — Patient Instructions (Addendum)
Will send a referral to bariatric surgery Will send a referral to psychiatric Stop Lisinipril Stop glipizide  Recommend a lowfat, low carbohydrate diet divided over 5-6 small meals, increase water intake to 6-8 glasses, and 150 minutes per week of cardiovascular exercise.   Refrain from sugary drinks

## 2016-07-28 ENCOUNTER — Other Ambulatory Visit: Payer: Self-pay | Admitting: Family Medicine

## 2016-07-28 DIAGNOSIS — E039 Hypothyroidism, unspecified: Secondary | ICD-10-CM

## 2016-07-28 LAB — MICROALBUMIN / CREATININE URINE RATIO: CREATININE, URINE: 104 mg/dL (ref 20–370)

## 2016-07-31 MED ORDER — AMLODIPINE BESYLATE 5 MG PO TABS: 5.0000 mg | ORAL_TABLET | Freq: Every day | ORAL | 5 refills | Status: DC

## 2016-08-03 ENCOUNTER — Other Ambulatory Visit: Payer: Self-pay | Admitting: Family Medicine

## 2016-08-03 DIAGNOSIS — M25559 Pain in unspecified hip: Secondary | ICD-10-CM

## 2016-08-14 ENCOUNTER — Other Ambulatory Visit: Payer: Self-pay | Admitting: Family Medicine

## 2016-08-14 DIAGNOSIS — J4520 Mild intermittent asthma, uncomplicated: Secondary | ICD-10-CM

## 2016-08-17 ENCOUNTER — Telehealth: Payer: Self-pay | Admitting: Internal Medicine

## 2016-08-17 NOTE — Telephone Encounter (Signed)
lmcb for pt.

## 2016-08-18 NOTE — Telephone Encounter (Signed)
atc pt, line rang several times, phone was answered, then hung up.  Wcb.

## 2016-08-19 NOTE — Telephone Encounter (Signed)
lmtcb x1 for pt. 

## 2016-08-20 MED ORDER — PREDNISONE 10 MG PO TABS: 10.0000 mg | ORAL_TABLET | Freq: Every day | ORAL | 3 refills | Status: DC

## 2016-08-20 MED ORDER — AMOXICILLIN 500 MG PO CAPS: 500.0000 mg | ORAL_CAPSULE | Freq: Two times a day (BID) | ORAL | 0 refills | Status: DC

## 2016-08-20 NOTE — Telephone Encounter (Signed)
Rx has been sent to preferred pharmacy.  Pt is aware and voiced her understanding. Nothing further needed.  

## 2016-08-20 NOTE — Telephone Encounter (Signed)
Patient returned phone call, patient contact # [phone], is ok to leave message...ert

## 2016-08-20 NOTE — Telephone Encounter (Signed)
Spoke to patient about symptoms. She states that she has had increased coughing for the past few weeks. Productive cough with clear phlegm. Denied any chest pain or SOB. She stated that during her last OV with you, her prednisone was decreased to 5mg  once a day. She has increased the prednisone back up 10mg  once a day herself and states that she is about to run out of medication. The increased dosage is not working.   Dr. Annamaria Boots, please advise. Pt wishes to use [facility] on [address]. Thanks!

## 2016-08-20 NOTE — Telephone Encounter (Signed)
Patient returning call - she can be reached at [phone] -pr

## 2016-08-20 NOTE — Telephone Encounter (Signed)
lmomtcb x 3  

## 2016-08-20 NOTE — Telephone Encounter (Signed)
lmtcb x1 for pt. 

## 2016-08-20 NOTE — Telephone Encounter (Signed)
Offer prednisone 10 mg, # 30, 1 daily, ref x 3  Amoxacillin 500 mg, # 14, 1 twice daily

## 2016-08-26 ENCOUNTER — Other Ambulatory Visit (HOSPITAL_COMMUNITY)
Admission: RE | Admit: 2016-08-26 | Discharge: 2016-08-26 | Disposition: A | Discharge status: Home / Self Care | Payer: Medicare Other | Source: Ambulatory Visit | Attending: Internal Medicine | Admitting: Internal Medicine

## 2016-08-26 ENCOUNTER — Other Ambulatory Visit: Payer: Medicare Other

## 2016-08-26 DIAGNOSIS — B20 Human immunodeficiency virus [HIV] disease: Secondary | ICD-10-CM | POA: Diagnosis not present

## 2016-08-26 DIAGNOSIS — Z113 Encounter for screening for infections with a predominantly sexual mode of transmission: Secondary | ICD-10-CM | POA: Diagnosis not present

## 2016-08-27 LAB — T-HELPER CELL (CD4) - (RCID CLINIC ONLY)
CD4 % Helper T Cell: 23 % — ABNORMAL LOW (ref 33–55)
CD4 T Cell Abs: 510 /uL (ref 400–2700)

## 2016-08-27 LAB — RPR: RPR Ser Ql: REACTIVE — AB

## 2016-08-27 LAB — URINE CYTOLOGY ANCILLARY ONLY
CHLAMYDIA, DNA PROBE: NEGATIVE
NEISSERIA GONORRHEA: NEGATIVE

## 2016-08-27 LAB — RPR TITER: RPR Titer: 1:32 {titer} — AB

## 2016-08-28 LAB — HIV-1 RNA QUANT-NO REFLEX-BLD
HIV 1 RNA QUANT: DETECTED {copies}/mL — AB
HIV-1 RNA Quant, Log: <1.3 Log copies/mL — AB

## 2016-08-30 LAB — FLUORESCENT TREPONEMAL AB(FTA)-IGG-BLD: Fluorescent Treponemal ABS: REACTIVE — AB

## 2016-09-01 ENCOUNTER — Ambulatory Visit (HOSPITAL_COMMUNITY): Payer: Medicare Other | Admitting: Clinical

## 2016-09-02 ENCOUNTER — Other Ambulatory Visit (INDEPENDENT_AMBULATORY_CARE_PROVIDER_SITE_OTHER): Payer: Medicare Other

## 2016-09-02 ENCOUNTER — Encounter: Payer: Self-pay | Admitting: Internal Medicine

## 2016-09-02 ENCOUNTER — Ambulatory Visit (INDEPENDENT_AMBULATORY_CARE_PROVIDER_SITE_OTHER): Payer: Medicare Other | Admitting: Internal Medicine

## 2016-09-02 VITALS — BP 118/78 | HR 91 | Ht 69.0 in | Wt 328.0 lb

## 2016-09-02 DIAGNOSIS — K219 Gastro-esophageal reflux disease without esophagitis: Secondary | ICD-10-CM

## 2016-09-02 DIAGNOSIS — D869 Sarcoidosis, unspecified: Secondary | ICD-10-CM

## 2016-09-02 DIAGNOSIS — J449 Chronic obstructive pulmonary disease, unspecified: Secondary | ICD-10-CM | POA: Diagnosis not present

## 2016-09-02 DIAGNOSIS — D860 Sarcoidosis of lung: Secondary | ICD-10-CM

## 2016-09-02 LAB — CBC WITH DIFFERENTIAL/PLATELET
BASOS ABS: 0.1 10*3/uL (ref 0.0–0.1)
Basophils Relative: 0.8 % (ref 0.0–3.0)
Eosinophils Absolute: 0.1 10*3/uL (ref 0.0–0.7)
Eosinophils Relative: 0.5 % (ref 0.0–5.0)
HEMATOCRIT: 41.5 % (ref 39.0–52.0)
Hemoglobin: 13.9 g/dL (ref 13.0–17.0)
LYMPHS PCT: 15.9 % (ref 12.0–46.0)
Lymphs Abs: 1.5 10*3/uL (ref 0.7–4.0)
MCHC: 33.4 g/dL (ref 30.0–36.0)
MCV: 90.6 fl (ref 78.0–100.0)
MONOS PCT: 7 % (ref 3.0–12.0)
Monocytes Absolute: 0.7 10*3/uL (ref 0.1–1.0)
Neutro Abs: 7.3 10*3/uL (ref 1.4–7.7)
Neutrophils Relative %: 75.8 % (ref 43.0–77.0)
Platelets: 219 10*3/uL (ref 150.0–400.0)
RBC: 4.58 Mil/uL (ref 4.22–5.81)
RDW: 16.5 % — ABNORMAL HIGH (ref 11.5–15.5)
WBC: 9.6 10*3/uL (ref 4.0–10.5)

## 2016-09-02 LAB — C-REACTIVE PROTEIN: CRP: 1.4 mg/dL (ref 0.5–20.0)

## 2016-09-02 LAB — SEDIMENTATION RATE: Sed Rate: 45 mm/hr — ABNORMAL HIGH (ref 0–15)

## 2016-09-02 LAB — ANGIOTENSIN CONVERTING ENZYME: ANGIOTENSIN-CONVERTING ENZYME: 17 U/L (ref 9–67)

## 2016-09-02 MED ORDER — BUDESONIDE-FORMOTEROL FUMARATE 160-4.5 MCG/ACT IN AERO: INHALATION_SPRAY | RESPIRATORY_TRACT | 6 refills | Status: DC

## 2016-09-02 NOTE — Patient Instructions (Signed)
Get yourself a "pill splitter" at the drug store and use that to split your prednisone pills.  Take one and a half ( total 15 mg) prednisone tabs daily  After 2 weeks, try reducing the prednisone to 1 tab ( 10 mg) daily   Sample and script  Symbicort 160  Inhaler      Inhale 2 puffs then rise mouth, twice daily. Leave it on your bathroom sink and use every morning and every night.  HC Parking  Order- schedule PFT     Dx sarcoid  Order- labs- CBC w diff, CRP, IgE, ACE level, Sed rate       Dx sarcoid

## 2016-09-02 NOTE — Progress Notes (Signed)
HPI  male/male transgender never smoker followed for Sarcoid, interstitial infiltrates, cough, GERD/aspiration pneumonia,complicated by history syphilis, HIV,  depression, DM  ------------------------------------------------------   04/01/2016- 41 year old male/male transgender never smoker followed for Sarcoid interstitial infiltrates, cough, GERD/aspiration pneumonia, complicated by history syphilis, HIV,, depression, DM FOLLOWS FOR: pt states breathing has improved since last OV. pt c/o sob with exertion, prod cough with clear mucus & occ wheezing.  ACE 11/17/15- > 100 Has continued prednisone 10 mg daily Minor occasional cough and wheeze with little sputum. Throat tickle cough worse lying down. Off GERD medicine. Denies heartburn. Asks about weight loss intervention CXR 12/30/2015- IMPRESSION: No active cardiopulmonary disease.  09/02/16- 41 year old male/male transgender never smoker followed for Sarcoid interstitial infiltrates, cough, GERD/aspiration pneumonia, complicated by history syphilis, HIV,, depression, DM FOLLOWS FOR:Pt noticed inreased wheezing and tickle in throat causing increased cough. She has since went back to taking Prednisone 2 tabs daily and this seems to be helping slightly. Weight up to 328 pounds Has begun the application process to be considered for bariatric surgery. Realizes prednisone encourages weight gain. Had recurrence of cough on 10 mg daily prednisone so moved back up to 20 mg daily. There is still mild daily nonproductive cough without fever. Aware of reflux on waking in the morning. Dyspnea on exertion, short walks, blamed on weight. Has not had Qvar. Does have albuterol HFA, nebulizer machine and Singulair used intermittently. Complains of tight numbness along lateral hips from silicone injections. Questions if there is "inflammation". No pain. I suggested we get a CRP and sedimentation rate. CXR 06/15/16 IMPRESSION: 1. No acute infiltrates 2.  Mildly prominent interstitial opacities at the lower lungs could relate to central airways inflammation.              ROS-see HPI   Negative unless "+" Constitutional:    weight loss, night sweats, fevers, chills, fatigue, lassitude. HEENT:    headaches, difficulty swallowing, tooth/dental problems, sore throat,       sneezing, itching, ear ache, nasal congestion, post nasal drip, snoring CV:    chest pain, orthopnea, PND, swelling in lower extremities, anasarca,                                                       dizziness, palpitations Resp:   + shortness of breath with exertion or at rest.                productive cough,   + non-productive cough, coughing up of blood.              change in color of mucus.  +wheezing.   Skin:    rash or lesions. GI:  No-   heartburn, indigestion, abdominal pain, nausea, vomiting,  GU: + HPI MS:   joint pain, stiffness, decreased range of motion, back pain. Neuro-     nothing unusual Psych:  No-change in mood or affect.  +depression or anxiety.   memory loss.  OBJ- Physical Exam  +presents as male, + morbidly obese General- Alert, Oriented, Affect-appropriate, Distress- none acute Skin- rash-none, lesions- none, excoriation- none, + tattoos Lymphadenopathy- L axilla puffy, tender Head- atraumatic            Eyes- Gross vision intact, PERRLA, conjunctivae and secretions clear            Ears- Hearing, canals-normal  Nose- Clear, no-Septal dev, mucus, polyps, erosion, perforation             Throat- Mallampati II , mucosa+tongue lightly coated , drainage- none, tonsils- atrophic Neck- flexible , trachea midline, no stridor , thyroid nl, carotid no bruit Chest - symmetrical excursion , unlabored           Heart/CV- RRR , no murmur , no gallop  , no rub, nl s1 s2                           - JVD- none , edema- none, stasis changes- none, varices- none           Lung- Clear, unlabored, cough-none , dullness-none, rub- none           Chest  wall-  Abd-  Br/ Gen/ Rectal- Not done, not indicated Extrem- cyanosis- none, clubbing, none, atrophy- none, strength- nl Neuro- grossly intact to observation

## 2016-09-03 ENCOUNTER — Other Ambulatory Visit: Payer: Self-pay | Admitting: Family Medicine

## 2016-09-03 DIAGNOSIS — M25559 Pain in unspecified hip: Secondary | ICD-10-CM

## 2016-09-03 LAB — IGE: IgE (Immunoglobulin E), Serum: 157 kU/L — ABNORMAL HIGH (ref <115)

## 2016-09-03 NOTE — Assessment & Plan Note (Addendum)
Increased dry cough is nonspecific but suggestive. Plan-ACE level. Get pill splitter and try to get prednisone down to 15 mg daily for couple of weeks, and then to 10 mg daily.

## 2016-09-03 NOTE — Assessment & Plan Note (Signed)
An active reflux component was identified when we first met but has been better controlled. Reflux precautions were reemphasized.

## 2016-09-03 NOTE — Assessment & Plan Note (Signed)
Always a complicated individual. Weight loss would help blood sugar control. I'm not sure if she would be accepted as a surgical candidate.

## 2016-09-03 NOTE — Assessment & Plan Note (Signed)
Increased cough/bronchitis syndrome, nonspecific. Dyspnea on exertion. Plan-renew HC parking, schedule PFT, labs for CBC, CRP, sedimentation rate, IgE, ACE level.

## 2016-09-06 ENCOUNTER — Other Ambulatory Visit: Payer: Self-pay | Admitting: Family Medicine

## 2016-09-06 DIAGNOSIS — E039 Hypothyroidism, unspecified: Secondary | ICD-10-CM

## 2016-09-06 NOTE — Progress Notes (Signed)
Spoke with pt and notified of results per Dr. Wert. Pt verbalized understanding and denied any questions. 

## 2016-09-09 ENCOUNTER — Ambulatory Visit (INDEPENDENT_AMBULATORY_CARE_PROVIDER_SITE_OTHER): Payer: Medicare Other | Admitting: Internal Medicine

## 2016-09-09 ENCOUNTER — Ambulatory Visit (INDEPENDENT_AMBULATORY_CARE_PROVIDER_SITE_OTHER): Payer: Medicare Other | Admitting: Licensed Clinical Social Worker

## 2016-09-09 ENCOUNTER — Encounter: Payer: Self-pay | Admitting: Internal Medicine

## 2016-09-09 DIAGNOSIS — A528 Late syphilis, latent: Secondary | ICD-10-CM

## 2016-09-09 DIAGNOSIS — R160 Hepatomegaly, not elsewhere classified: Secondary | ICD-10-CM

## 2016-09-09 DIAGNOSIS — B20 Human immunodeficiency virus [HIV] disease: Secondary | ICD-10-CM | POA: Diagnosis not present

## 2016-09-09 DIAGNOSIS — F331 Major depressive disorder, recurrent, moderate: Secondary | ICD-10-CM

## 2016-09-09 DIAGNOSIS — F4321 Adjustment disorder with depressed mood: Secondary | ICD-10-CM

## 2016-09-09 MED ORDER — PENICILLIN G BENZATHINE 1200000 UNIT/2ML IM SUSP
1.2000 10*6.[IU] | Freq: Once | INTRAMUSCULAR | Status: AC
  Administered 2016-09-09: 1.2 10*6.[IU] via INTRAMUSCULAR

## 2016-09-09 MED ORDER — PENICILLIN G BENZATHINE 1200000 UNIT/2ML IM SUSP: 2.4000 10*6.[IU] | INTRAMUSCULAR | Status: DC

## 2016-09-09 MED ORDER — IBUPROFEN 200 MG PO TABS
600.0000 mg | ORAL_TABLET | Freq: Once | ORAL | Status: AC
  Administered 2016-09-09: 600 mg via ORAL

## 2016-09-09 NOTE — Assessment & Plan Note (Signed)
A liver lesion was noted on her abdominal MRI in January. The cause of the lesion was indeterminate. She needs a follow-up MRI next month.

## 2016-09-09 NOTE — Assessment & Plan Note (Addendum)
Her RPR has gone back up from 1:8 to 1:32. I talked to her about the importance of condom use. 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

## 2016-09-09 NOTE — Progress Notes (Signed)
Patient Active Problem List   Diagnosis Date Noted  . Sarcoidosis of lung ([place]) 01/31/2014    Priority: High  . Human immunodeficiency virus (HIV) disease ([place]) 04/26/2006    Priority: High  . Depression 04/26/2006    Priority: Medium  . Hip pain, bilateral 04/27/2016  . Urine leukocytes 04/27/2016  . Weight gain 04/27/2016  . Urinary hesitancy 04/27/2016  . Liver mass 04/05/2016  . Abdominal pain 03/09/2016  . Diabetes mellitus without complication ([place]) 09/73/5329  . 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 GERD (gastroesophageal reflux disease) 08/15/2014  . Pulmonary infiltrates 07/25/2014  . Bronchitis, chronic obstructive ([place]) 07/16/2014  . SOB (shortness of breath) 02/04/2014  . Syncope 02/04/2014  . Immunization due 01/31/2014  . Tachycardia 01/31/2014  . Acute bronchitis 10/03/2013  . Generalized swelling, mass, or lump of abdomen or pelvis 09/21/2013  . Other allergic rhinitis 09/21/2013  . Essential hypertension 09/21/2013  . Preventative health care 09/21/2013  . Wheezing 08/07/2013  . Peripheral neuropathy 08/31/2010  . MOLE 10/14/2009  . SKIN RASH 10/14/2009  . MORBID OBESITY 04/22/2009  . 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 ALLERGIC RHINITIS 04/26/2006  . Hilar adenopathy 04/26/2006    Patient's Medications  New Prescriptions   No medications on file  Previous Medications   AMLODIPINE (NORVASC) 5 MG TABLET    Take 1 tablet (5 mg total) by mouth daily.   BECLOMETHASONE DIPROP HFA (QVAR REDIHALER) 40 MCG/ACT AERB    Inhale 1 puff into the lungs daily.   BLOOD GLUCOSE METER KIT AND SUPPLIES KIT    Dispense based on patient and insurance preference. Use up to four times daily as directed. (FOR ICD-9 250.00, 250.01).   BUDESONIDE-FORMOTEROL (SYMBICORT) 160-4.5 MCG/ACT INHALER    Inhale 2 puffs then rinse mouth, twice daily   EMTRICITABINE-TENOFOVIR AF (DESCOVY) 200-25 MG  TABLET    Take 1 tablet by mouth daily.   LEVOTHYROXINE (SYNTHROID, LEVOTHROID) 100 MCG TABLET    TAKE 1 TABLET(100 MCG) BY MOUTH DAILY BEFORE BREAKFAST   METFORMIN (GLUCOPHAGE) 500 MG TABLET    Take 1 tablet (500 mg total) by mouth daily with breakfast.   MONTELUKAST (SINGULAIR) 10 MG TABLET    Take 1 tablet (10 mg total) by mouth at bedtime.   NAPROXEN (NAPROSYN) 500 MG TABLET    TAKE 1 TABLET(500 MG) BY MOUTH TWICE DAILY WITH A MEAL   NEBULIZERS (COMPRESSOR NEBULIZER) MISC    1 Device by Does not apply route every 6 (six) hours as needed.   PANTOPRAZOLE (PROTONIX) 20 MG TABLET    TAKE 1 TABLET(20 MG) BY MOUTH DAILY   PREDNISONE (DELTASONE) 10 MG TABLET    TAKE 1 TABLET BY MOUTH DAILY OR AS DIRECTED   TIVICAY 50 MG TABLET    TAKE 1 TABLET BY MOUTH DAILY   VENTOLIN HFA 108 (90 BASE) MCG/ACT INHALER    INHALE 2 PUFFS INTO THE LUNGS EVERY 6 HOURS AS NEEDED WHEEZING OR SHORTNESS OF BREATH  Modified Medications   No medications on file  Discontinued Medications   No medications on file    Subjective: Robert Dawson is in for her routine HIV follow-up visit. She has had no problems obtaining, taking or tolerating Descovy and Tivicay. She takes them each morning. She has not missed any doses. She has been sexually active on a few occasions with her boyfriend. She is feeling more depressed. She is upset  about her weight gain and body appearance. She is hoping to be able to undergo gastric sleeve bariatric surgery.   Review of Systems: Review of Systems  Constitutional: Positive for malaise/fatigue. Negative for chills, diaphoresis, fever and weight loss.  HENT: Negative for sore throat.   Respiratory: Negative for cough, sputum production and shortness of breath.   Cardiovascular: Negative for chest pain.  Gastrointestinal: Negative for abdominal pain, diarrhea, heartburn, nausea and vomiting.  Genitourinary: Negative for dysuria and frequency.  Musculoskeletal: Negative for joint pain and myalgias.    Skin: Negative for rash.  Neurological: Negative for dizziness and headaches.  Psychiatric/Behavioral: Positive for depression. Negative for substance abuse. The patient is not nervous/anxious.     Past Medical History:  Diagnosis Date  . Allergy   . Depression   . Diabetes ([place]) 10/03/2015  . HIV disease ([place])   . Immune deficiency disorder [facility])     Social History  Substance Use Topics  . Smoking status: Never Smoker  . Smokeless tobacco: Never Used  . Alcohol use No    Family History  Problem Relation Age of Onset  . Diabetes Maternal Grandmother   . Hypertension Maternal Grandmother     Allergies  Allergen Reactions  . Lisinopril Cough    Objective:  Vitals:   09/09/16 1120  BP: (!) 150/78  Pulse: 96  Temp: 98.2 F (36.8 C)  TempSrc: Oral  Weight: (!) 333 lb (151 kg)  Height: '5\' 9"'  (1.753 m)   Body mass index is 49.18 kg/m.  Physical Exam  Constitutional: He is oriented to person, place, and time. No distress.  HENT:  Mouth/Throat: No oropharyngeal exudate.  Eyes: Conjunctivae are normal.  Cardiovascular: Normal rate and regular rhythm.   No murmur heard. Pulmonary/Chest: Effort normal and breath sounds normal.  Abdominal: Soft. He exhibits no mass. There is no tenderness.  Musculoskeletal: Normal range of motion.  Neurological: He is alert and oriented to person, place, and time.  Skin: No rash noted.  Psychiatric: Mood and affect normal.  Flat affect.    Lab Results Lab Results  Component Value Date   WBC 9.6 09/02/2016   HGB 13.9 09/02/2016   HCT 41.5 09/02/2016   MCV 90.6 09/02/2016   PLT 219.0 09/02/2016    Lab Results  Component Value Date   CREATININE 1.27 04/27/2016   BUN 16 04/27/2016   NA 138 04/27/2016   K 4.4 04/27/2016   CL 103 04/27/2016   CO2 26 04/27/2016    Lab Results  Component Value Date   ALT 14 04/01/2016   AST 31 04/01/2016   ALKPHOS 78 04/01/2016   BILITOT 0.4 04/01/2016    Lab Results  Component  Value Date   CHOL 151 10/20/2015   HDL 40 10/20/2015   LDLCALC 87 10/20/2015   TRIG 120 10/20/2015   CHOLHDL 3.8 10/20/2015   HIV 1 RNA Quant (copies/mL)  Date Value  08/26/2016 <20 DETECTED (A)  04/01/2016 CANCELED  02/24/2016 <20   CD4 T Cell Abs (/uL)  Date Value  08/26/2016 510  02/24/2016 570  09/04/2015 450     Problem List Items Addressed This Visit      High   Human immunodeficiency virus (HIV) disease ([place])    Her infection is under excellent control. She will continue Descovy and Tivicay and follow-up after blood work in 6 months.      Relevant Medications   penicillin g benzathine (BICILLIN LA) 1200000 UNIT/2ML injection 1.2 Million Units (Completed)  penicillin g benzathine (BICILLIN LA) 1200000 UNIT/2ML injection 1.2 Million Units (Completed)   Other Relevant Orders   T-helper cell (CD4)- ([facility] clinic only)   HIV 1 RNA quant-no reflex-bld   RPR     Medium   Depression    She is more depressed. I had her meet with our behavioral health counselor, Sande Rives, today. She also will be meeting with a counselor about the possibility of bariatric surgery.        Unprioritized   Liver mass    A liver lesion was noted on her abdominal MRI in January. The cause of the lesion was indeterminate. She needs a follow-up MRI next month.      Relevant Orders   MR ABDOMEN W WO CONTRAST   SYPHILIS, LATE, LATENT    Her RPR has gone back up from 1:8 to 1:32. I talked to her about the importance of condom use. 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      Relevant Medications   penicillin g benzathine (BICILLIN LA) 1200000 UNIT/2ML injection 1.2 Million Units (Completed)   penicillin g benzathine (BICILLIN LA) 1200000 UNIT/2ML injection 1.2 Million Units (Completed)   ibuprofen (ADVIL,MOTRIN) tablet 600 mg (Completed)        Michel Bickers, MD [facility] [place] [phone] pager   [phone]  cell 09/09/2016, 1:13 PM

## 2016-09-09 NOTE — Assessment & Plan Note (Signed)
Her infection is under excellent control. She will continue Descovy and Tivicay and follow-up after blood work in 6 months.

## 2016-09-09 NOTE — Assessment & Plan Note (Signed)
She is more depressed. I had her meet with our behavioral health counselor, Sande Rives, today. She also will be meeting with a counselor about the possibility of bariatric surgery.

## 2016-09-14 NOTE — Progress Notes (Signed)
Integrated Behavioral Health Initial Visit  MRN: [id number] Name: Robert Dawson   Session Start time: 12:00 pm Session End time: 12:20 pm Total time: 20 minutes  Type of Service: [place] Interpretor:No. Interpretor Name and Language: N/A   Warm Hand Off Completed.       SUBJECTIVE: Robert Dawson is a 41 y.o. male accompanied by patient. Patient was referred by [name] for body image issues.  Patient reports the following symptoms/concerns: Patient reported that she is unsatisfied with her weight and body image.  Patient denied being unsatisfied with her transition from male to male and reported the issue is related to weight gain.  The patient reported that her PCP placed her on a steroid medication that is causing weight gain.  The patient reported gaining in excess of 140 pounds since starting the medication, which has caused social isolation and discomfort.  The patient reported that she is about to have surgery to have the GI sleeve put on her stomach but before surgery must begin sessions with a therapist.  The patient reported that she is waiting to receive a call to schedule her first session, which will be within the next two weeks.  The patient denied knowing the arrangements of where she will receive sessions.  Severity of problem: moderate  OBJECTIVE: Mood: Anxious and Depressed and Affect: Appropriate Risk of harm to self or others: No plan to harm self or others  Thought process: coherent Thought content: logical   GOALS ADDRESSED: Patient will reduce symptoms of: anxiety and depression and increase knowledge and/or ability of: coping skills and also: Increase healthy adjustment to current life circumstances   INTERVENTIONS: Supportive Counseling   ASSESSMENT: Patient currently experiencing dissatisfaction with weight gain. Patient may benefit from receiving services from local mental health.  PLAN: 1. Patient  will make appointment arrangements to begin counseling  2. Follow up with behavioral health clinician after one and a half weeks of not hearing back regarding the appointment.   Sande Rives, [facility]

## 2016-09-16 ENCOUNTER — Ambulatory Visit (INDEPENDENT_AMBULATORY_CARE_PROVIDER_SITE_OTHER): Payer: Medicare Other | Admitting: *Deleted

## 2016-09-16 DIAGNOSIS — A539 Syphilis, unspecified: Secondary | ICD-10-CM | POA: Diagnosis present

## 2016-09-16 MED ORDER — PENICILLIN G BENZATHINE 1200000 UNIT/2ML IM SUSP
1.2000 10*6.[IU] | Freq: Once | INTRAMUSCULAR | Status: AC
  Administered 2016-09-16: 1.2 10*6.[IU] via INTRAMUSCULAR

## 2016-09-17 ENCOUNTER — Other Ambulatory Visit: Payer: Self-pay | Admitting: Internal Medicine

## 2016-09-17 ENCOUNTER — Telehealth: Payer: Self-pay

## 2016-09-17 DIAGNOSIS — B20 Human immunodeficiency virus [HIV] disease: Secondary | ICD-10-CM

## 2016-09-17 NOTE — Telephone Encounter (Signed)
Patient called to ask when psychiatry appointment is scheduled. I can not see this in epic put I called to confirm with Behavior Health that patient is scheduled. They have him scheduled for 09-27-2016@9 :00am. I advised patient of this and gave patient their phone number. Thanks!

## 2016-09-18 ENCOUNTER — Other Ambulatory Visit: Payer: Self-pay | Admitting: Family Medicine

## 2016-09-18 DIAGNOSIS — J4520 Mild intermittent asthma, uncomplicated: Secondary | ICD-10-CM

## 2016-09-23 ENCOUNTER — Ambulatory Visit (INDEPENDENT_AMBULATORY_CARE_PROVIDER_SITE_OTHER): Payer: Medicare Other | Admitting: *Deleted

## 2016-09-23 DIAGNOSIS — A539 Syphilis, unspecified: Secondary | ICD-10-CM

## 2016-09-23 MED ORDER — PENICILLIN G BENZATHINE 1200000 UNIT/2ML IM SUSP
1.2000 10*6.[IU] | Freq: Once | INTRAMUSCULAR | Status: AC
  Administered 2016-09-23: 1.2 10*6.[IU] via INTRAMUSCULAR

## 2016-09-23 NOTE — Patient Instructions (Signed)
Patient advised to always use protection for all sexual encounters.  Patient advised that the patient will be contacted by the local health department about sexual partners.  Patient given condoms, lubricant and dental dams.

## 2016-09-23 NOTE — Progress Notes (Signed)
STI treatment per Dr Megan Salon.  Patient advised to always use protection for all sexual encounters.  Patient advised that the patient will be contacted by the local health department about sexual partners.  Patient given condoms, lubricant and dental dams.

## 2016-09-27 ENCOUNTER — Ambulatory Visit (INDEPENDENT_AMBULATORY_CARE_PROVIDER_SITE_OTHER): Payer: Medicare Other | Admitting: Clinical

## 2016-09-27 ENCOUNTER — Encounter (HOSPITAL_COMMUNITY): Payer: Self-pay | Admitting: Clinical

## 2016-09-27 ENCOUNTER — Other Ambulatory Visit: Payer: Self-pay

## 2016-09-27 DIAGNOSIS — I10 Essential (primary) hypertension: Secondary | ICD-10-CM

## 2016-09-27 DIAGNOSIS — F411 Generalized anxiety disorder: Secondary | ICD-10-CM

## 2016-09-27 DIAGNOSIS — F4310 Post-traumatic stress disorder, unspecified: Secondary | ICD-10-CM | POA: Diagnosis not present

## 2016-09-27 DIAGNOSIS — F331 Major depressive disorder, recurrent, moderate: Secondary | ICD-10-CM | POA: Diagnosis not present

## 2016-09-27 MED ORDER — AMLODIPINE BESYLATE 5 MG PO TABS: 5.0000 mg | ORAL_TABLET | Freq: Every day | ORAL | 5 refills | Status: DC
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
## 2016-09-28 ENCOUNTER — Ambulatory Visit
Admission: RE | Admit: 2016-09-28 | Discharge: 2016-09-28 | Disposition: A | Discharge status: Home / Self Care | Payer: Medicare Other | Source: Ambulatory Visit | Attending: Internal Medicine | Admitting: Internal Medicine

## 2016-09-28 DIAGNOSIS — R160 Hepatomegaly, not elsewhere classified: Secondary | ICD-10-CM

## 2016-09-28 MED ORDER — GADOXETATE DISODIUM 0.25 MMOL/ML IV SOLN
10.0000 mL | Freq: Once | INTRAVENOUS | Status: AC | PRN
  Administered 2016-09-28: 10 mL via INTRAVENOUS

## 2016-10-11 ENCOUNTER — Other Ambulatory Visit: Payer: Self-pay | Admitting: Family Medicine

## 2016-10-11 DIAGNOSIS — E039 Hypothyroidism, unspecified: Secondary | ICD-10-CM

## 2016-10-12 ENCOUNTER — Other Ambulatory Visit: Payer: Self-pay

## 2016-10-12 DIAGNOSIS — E118 Type 2 diabetes mellitus with unspecified complications: Secondary | ICD-10-CM

## 2016-10-12 DIAGNOSIS — E119 Type 2 diabetes mellitus without complications: Secondary | ICD-10-CM

## 2016-10-12 MED ORDER — METFORMIN HCL 500 MG PO TABS: 500.0000 mg | ORAL_TABLET | Freq: Every day | ORAL | 1 refills | Status: DC

## 2016-10-12 NOTE — Telephone Encounter (Signed)
Refill request for metformin has been approved and sent into pharmacy. Thanks!

## 2016-10-13 ENCOUNTER — Other Ambulatory Visit: Payer: Self-pay | Admitting: Family Medicine

## 2016-10-13 DIAGNOSIS — J4520 Mild intermittent asthma, uncomplicated: Secondary | ICD-10-CM

## 2016-10-28 ENCOUNTER — Ambulatory Visit (INDEPENDENT_AMBULATORY_CARE_PROVIDER_SITE_OTHER): Payer: Medicare Other | Admitting: Family Medicine

## 2016-10-28 ENCOUNTER — Ambulatory Visit (HOSPITAL_COMMUNITY)
Admission: RE | Admit: 2016-10-28 | Discharge: 2016-10-28 | Disposition: A | Discharge status: Home / Self Care | Payer: Medicare Other | Source: Ambulatory Visit | Attending: Family Medicine | Admitting: Family Medicine

## 2016-10-28 ENCOUNTER — Other Ambulatory Visit: Payer: Self-pay | Admitting: Internal Medicine

## 2016-10-28 ENCOUNTER — Encounter: Payer: Self-pay | Admitting: Family Medicine

## 2016-10-28 ENCOUNTER — Other Ambulatory Visit: Payer: Self-pay | Admitting: Family Medicine

## 2016-10-28 VITALS — BP 140/87 | HR 82 | Temp 98.2°F | Resp 16 | Ht 69.0 in | Wt 330.0 lb

## 2016-10-28 DIAGNOSIS — I10 Essential (primary) hypertension: Secondary | ICD-10-CM | POA: Diagnosis not present

## 2016-10-28 DIAGNOSIS — R05 Cough: Secondary | ICD-10-CM | POA: Diagnosis not present

## 2016-10-28 DIAGNOSIS — R0600 Dyspnea, unspecified: Secondary | ICD-10-CM | POA: Diagnosis not present

## 2016-10-28 DIAGNOSIS — R053 Chronic cough: Secondary | ICD-10-CM

## 2016-10-28 DIAGNOSIS — E039 Hypothyroidism, unspecified: Secondary | ICD-10-CM | POA: Diagnosis not present

## 2016-10-28 DIAGNOSIS — R160 Hepatomegaly, not elsewhere classified: Secondary | ICD-10-CM

## 2016-10-28 DIAGNOSIS — R0609 Other forms of dyspnea: Secondary | ICD-10-CM

## 2016-10-28 DIAGNOSIS — R918 Other nonspecific abnormal finding of lung field: Secondary | ICD-10-CM | POA: Diagnosis not present

## 2016-10-28 DIAGNOSIS — J181 Lobar pneumonia, unspecified organism: Principal | ICD-10-CM

## 2016-10-28 DIAGNOSIS — J189 Pneumonia, unspecified organism: Secondary | ICD-10-CM

## 2016-10-28 LAB — POCT URINALYSIS DIP (DEVICE)
Bilirubin Urine: NEGATIVE
Glucose, UA: NEGATIVE mg/dL
HGB URINE DIPSTICK: NEGATIVE
KETONES UR: NEGATIVE mg/dL
Nitrite: NEGATIVE
PH: 5.5 (ref 5.0–8.0)
PROTEIN: NEGATIVE mg/dL
Urobilinogen, UA: 0.2 mg/dL (ref 0.0–1.0)

## 2016-10-28 LAB — TSH: TSH: 1.74 mIU/L (ref 0.40–4.50)

## 2016-10-28 MED ORDER — ALBUTEROL SULFATE (2.5 MG/3ML) 0.083% IN NEBU
2.5000 mg | INHALATION_SOLUTION | Freq: Once | RESPIRATORY_TRACT | Status: AC
  Administered 2016-10-28: 2.5 mg via RESPIRATORY_TRACT

## 2016-10-28 MED ORDER — LEVOFLOXACIN 500 MG PO TABS: 500.0000 mg | ORAL_TABLET | Freq: Every day | ORAL | 0 refills | Status: DC

## 2016-10-28 MED ORDER — IPRATROPIUM BROMIDE 0.02 % IN SOLN
0.5000 mg | Freq: Once | RESPIRATORY_TRACT | Status: AC
  Administered 2016-10-28: 0.5 mg via RESPIRATORY_TRACT

## 2016-10-28 NOTE — Progress Notes (Signed)
al

## 2016-10-28 NOTE — Progress Notes (Signed)
Subjective:    Patient ID: Robert Dawson, male    DOB: 1975/07/15, 41 y.o.   MRN: [id number] HPI  Ms. Nakul Avino, a 41 year old transgender MTF with a history of HIV, DMII, hypothyroidism, obesity, and intermittent asthma presents for a follow up. She is complaining of persistent cough and shortness of breath on exertion. She says that she has had a persistent cough for the past several months. She has attempted OTC antihistamines and cough supressants without relief. She describes cough as dry and hacking. She is on Lisinopril daily for hypertension. She endorses periodic shortness of breath. She uses symbicort and prednisone as prescribed and is followed by pulmonologist for sarcoidosis. She denies chest pain, fever, fatigue, nausea, vomiting, or diarrhea.   Past Medical History:  Diagnosis Date  . Allergy   . Depression   . Diabetes ([place]) 10/03/2015  . HIV disease ([place])   . Immune deficiency disorder [facility])    Social History   Social History  . Marital status: Single    Spouse name: N/A  . Number of children: N/A  . Years of education: N/A   Occupational History  . Not on file.   Social History Main Topics  . Smoking status: Never Smoker  . Smokeless tobacco: Never Used  . Alcohol use No  . Drug use: No  . Sexual activity: Not Currently    Partners: Male, Male    Birth control/ protection: Condom     Comment: given condoms. Pt is an under.   Other Topics Concern  . Not on file   Social History Narrative  . No narrative on file   Allergies  Allergen Reactions  . Lisinopril Cough    Immunization History  Administered Date(s) Administered  . H1N1 05/09/2008  . Hepatitis B 04/22/2004, 08/04/2004, 10/27/2004  . Influenza Split 01/14/2011  . Influenza Whole 04/26/2006, 05/09/2008, 04/22/2009, 06/16/2010  . Influenza,inj,Quad PF,36+ Mos 12/12/2012, 01/31/2014, 12/30/2015  . Pneumococcal Polysaccharide-23 11/21/2003, 04/22/2009  . Tdap 01/31/2014   Review  of Systems  Constitutional: Positive for unexpected weight change (weight gain).  HENT: Negative for ear pain and rhinorrhea.   Eyes: Negative.   Respiratory: Positive for cough (persistent) and shortness of breath. Negative for apnea.   Gastrointestinal: Negative.   Endocrine: Negative for polydipsia, polyphagia and polyuria.  Genitourinary: Negative.   Musculoskeletal: Negative.   Skin: Negative.   Allergic/Immunologic: Positive for environmental allergies and immunocompromised state (HIV +).  Neurological: Negative.   Hematological: Negative.   Psychiatric/Behavioral: Negative.       Objective:   Physical Exam  Constitutional: He is oriented to person, place, and time. He appears well-developed and well-nourished.  Morbid obesity  HENT:  Head: Normocephalic and atraumatic.  Right Ear: External ear normal.  Left Ear: External ear normal.  Nose: Nose normal.  Mouth/Throat: Oropharynx is clear and moist.  Eyes: Pupils are equal, round, and reactive to light. Conjunctivae and EOM are normal.  Neck: Normal range of motion.  Cardiovascular: Normal rate, regular rhythm, normal heart sounds and intact distal pulses.   Pulmonary/Chest: Effort normal. He has decreased breath sounds in the right lower field and the left lower field. He has no wheezes. He has no rhonchi. He has no rales.  Abdominal: Soft. Bowel sounds are normal.  Increased abdominal girth  Musculoskeletal: Normal range of motion.  Neurological: He is alert and oriented to person, place, and time. He has normal reflexes.  Skin: Skin is warm and dry.  Psychiatric: He has a  normal mood and affect. His behavior is normal. Judgment and thought content normal.     BP 140/87 (BP Location: Left Arm, Patient Position: Sitting, Cuff Size: Large)   Pulse 82   Temp 98.2 F (36.8 C) (Oral)   Resp 16   Ht 5\' 9"  (1.753 m)   Wt (!) 330 lb (149.7 kg)   SpO2 96%   BMI 48.73 kg/m  Assessment & Plan:  1. Persistent cough Will  review chest xray to rule out any acute pulmonary findings - DG Chest 2 View; Future - albuterol (PROVENTIL) (2.5 MG/3ML) 0.083% nebulizer solution 2.5 mg; Take 3 mLs (2.5 mg total) by nebulization once. - ipratropium (ATROVENT) nebulizer solution 0.5 mg; Take 2.5 mLs (0.5 mg total) by nebulization once.  2. Dyspnea on exertion Lung sounds improved on auscultation following duo nebulizer treatment - DG Chest 2 View; Future - albuterol (PROVENTIL) (2.5 MG/3ML) 0.083% nebulizer solution 2.5 mg; Take 3 mLs (2.5 mg total) by nebulization once. - ipratropium (ATROVENT) nebulizer solution 0.5 mg; Take 2.5 mLs (0.5 mg total) by nebulization once.  3. Essential hypertension Blood pressure is at goal on current medication regimen. No medication changes warranted  4. Acquired hypothyroidism Will continue levothyroxine at current dosage. Will follow up by phone with any lab abnormalities  - TSH   RTC: Will follow up in 3 months for hypertension.    Donia Pounds  MSN, FNP-C [facility] [address] [place], [place] [address] [phone]

## 2016-10-28 NOTE — Patient Instructions (Addendum)
Will follow up by phone after reviewing chest xray Chest X-Ray A chest X-ray is a painless test that uses radiation to create images of the structures inside of your chest. Chest X-rays are used to look for many health conditions, including heart failure, pneumonia, tuberculosis, rib fractures, breathing disorders, and cancer. They may be used to diagnose chest pain, constant coughing, or trouble breathing. Tell a health care provider about:  Any allergies you have.  All medicines you are taking, including vitamins, herbs, eye drops, creams, and over-the-counter medicines.  Any surgeries you have had.  Any medical conditions you have.  Whether you are pregnant or may be pregnant. What are the risks? Getting a chest X-ray is a safe procedure. However, you will be exposed to a small amount of radiation. Being exposed to too much radiation over a lifetime can increase the risk of cancer. This risk is small, but it may occur if you have many X-rays throughout your life. What happens before the procedure?  You may be asked to remove glasses, jewelry, and any other metal objects.  You will be asked to undress from the waist up. You may be given a hospital gown to wear.  You may be asked to wear a protective lead apron to protect parts of your body from radiation. What happens during the procedure?  You will be asked to stand still as each picture is taken to get the best possible images.  You will be asked to take a deep breath and hold your breath for a few seconds.  The X-ray machine will create a picture of your chest using a tiny burst of radiation. This is painless.  More pictures may be taken from other angles. Typically, one picture will be taken while you face the X-ray camera, and another picture will be taken from the side while you stand. If you cannot stand, you may be asked to lie down. The procedure may vary among health care providers and hospitals. What happens after the  procedure?  The X-ray(s) will be reviewed by your health care provider or an X-ray (radiology) specialist.  It is up to you to get your test results. Ask your health care provider, or the department that is doing the test, when your results will be ready.  Your health care provider will tell you if you need more tests or a follow-up exam. Keep all follow-up visits as told by your health care provider. This is important. Summary  A chest X-ray is a safe, painless test that is used to examine the inside of the chest, heart, and lungs.  You will need to undress from the waist up and remove jewelry and metal objects before the procedure.  You will be exposed to a small amount of radiation during the procedure.  The X-ray machine will take one or more pictures of your chest while you remain as still as possible.  Later, a health care provider or specialist will review the test results with you. This information is not intended to replace advice given to you by your health care provider. Make sure you discuss any questions you have with your health care provider. Document Released: 05/11/2016 Document Revised: 05/11/2016 Document Reviewed: 05/11/2016 Elsevier Interactive Patient Education  2018 [place] of Breath, Adult Shortness of breath means you have trouble breathing. Your lungs are organs for breathing. Follow these instructions at home: Pay attention to any changes in your symptoms. Take these actions to help with your condition:  Do not smoke. Smoking can cause shortness of breath. If you need help to quit smoking, ask your doctor.  Avoid things that can make it harder to breathe, such as: ? Mold. ? Dust. ? Air pollution. ? Chemical smells. ? Things that can cause allergy symptoms (allergens), if you have allergies.  Keep your living space clean and free of mold and dust.  Rest as needed. Slowly return to your usual activities.  Take over-the-counter and  prescription medicines, including oxygen and inhaled medicines, only as told by your doctor.  Keep all follow-up visits as told by your doctor. This is important.  Contact a doctor if:  Your condition does not get better as soon as expected.  You have a hard time doing your normal activities, even after you rest.  You have new symptoms. Get help right away if:  You have trouble breathing when you are resting.  You feel light-headed or you faint.  You have a cough that is not helped by medicines.  You cough up blood.  You have pain with breathing.  You have pain in your chest, arms, shoulders, or belly (abdomen).  You have a fever.  You cannot walk up stairs.  You cannot exercise the way you normally do. This information is not intended to replace advice given to you by your health care provider. Make sure you discuss any questions you have with your health care provider. Document Released: 09/01/2007 Document Revised: 04/01/2016 Document Reviewed: 04/01/2016 Elsevier Interactive Patient Education  2017 [employer].

## 2016-10-28 NOTE — Progress Notes (Signed)
Meds ordered this encounter  Medications  . levofloxacin (LEVAQUIN) 500 MG tablet    Sig: Take 1 tablet (500 mg total) by mouth daily.    Dispense:  7 tablet    Refill:  0     Donia Pounds  MSN, FNP-C [facility] [address] [place], [place] [address] [phone]

## 2016-10-29 ENCOUNTER — Telehealth: Payer: Self-pay

## 2016-10-29 NOTE — Telephone Encounter (Signed)
Called, no answer. No Voicemail was unable to leave a message. Will try later.

## 2016-10-29 NOTE — Telephone Encounter (Signed)
-----   Message from Dorena Dew, [place] sent at 10/29/2016  8:28 AM EDT ----- Regarding: lab results Please inform patient that TSH is within a normal range, no medication changes are warranted at this time. Please remind her that I sent Levofloxacin 500 mg for 7 days and she will repeat chest xray on August 30th. Follow up in office as scheduled.   Thanks ----- Message ----- From: Interface, [facility] Sent: 10/28/2016   8:32 PM To: Dorena Dew, FNP

## 2016-11-03 ENCOUNTER — Telehealth: Payer: Self-pay

## 2016-11-03 NOTE — Telephone Encounter (Signed)
Spoke with patient advised that the xray should be done around August 30th and that no appointment is needed for this, just to walk in to radiology on August 30th. Thanks!

## 2016-11-12 ENCOUNTER — Other Ambulatory Visit: Payer: Medicare Other

## 2016-11-16 ENCOUNTER — Other Ambulatory Visit: Payer: Self-pay | Admitting: Family Medicine

## 2016-11-16 DIAGNOSIS — J4520 Mild intermittent asthma, uncomplicated: Secondary | ICD-10-CM

## 2016-12-04 ENCOUNTER — Other Ambulatory Visit: Payer: Self-pay | Admitting: Family Medicine

## 2016-12-04 DIAGNOSIS — M25559 Pain in unspecified hip: Secondary | ICD-10-CM

## 2016-12-09 ENCOUNTER — Ambulatory Visit (INDEPENDENT_AMBULATORY_CARE_PROVIDER_SITE_OTHER): Payer: Medicare Other | Admitting: Internal Medicine

## 2016-12-09 ENCOUNTER — Encounter: Payer: Self-pay | Admitting: Internal Medicine

## 2016-12-09 ENCOUNTER — Ambulatory Visit (INDEPENDENT_AMBULATORY_CARE_PROVIDER_SITE_OTHER)
Admission: RE | Admit: 2016-12-09 | Discharge: 2016-12-09 | Disposition: A | Discharge status: Home / Self Care | Payer: Medicare Other | Source: Ambulatory Visit | Attending: Internal Medicine | Admitting: Internal Medicine

## 2016-12-09 VITALS — BP 120/72 | HR 78 | Ht 69.0 in | Wt 324.0 lb

## 2016-12-09 DIAGNOSIS — J449 Chronic obstructive pulmonary disease, unspecified: Secondary | ICD-10-CM | POA: Diagnosis not present

## 2016-12-09 DIAGNOSIS — R0602 Shortness of breath: Secondary | ICD-10-CM | POA: Diagnosis not present

## 2016-12-09 DIAGNOSIS — D869 Sarcoidosis, unspecified: Secondary | ICD-10-CM | POA: Diagnosis not present

## 2016-12-09 DIAGNOSIS — R05 Cough: Secondary | ICD-10-CM | POA: Diagnosis not present

## 2016-12-09 DIAGNOSIS — D860 Sarcoidosis of lung: Secondary | ICD-10-CM | POA: Diagnosis not present

## 2016-12-09 LAB — PULMONARY FUNCTION TEST
DL/VA % pred: 116 %
DL/VA: 5.35 ml/min/mmHg/L
DLCO COR: 25.45 ml/min/mmHg
DLCO UNC % PRED: 81 %
DLCO cor % pred: 82 %
DLCO unc: 25.23 ml/min/mmHg
FEF 25-75 PRE: 1.97 L/s
FEF 25-75 Post: 2.01 L/sec
FEF2575-%CHANGE-POST: 2 %
FEF2575-%PRED-PRE: 55 %
FEF2575-%Pred-Post: 56 %
FEV1-%CHANGE-POST: 1 %
FEV1-%PRED-PRE: 77 %
FEV1-%Pred-Post: 78 %
FEV1-POST: 2.69 L
FEV1-PRE: 2.65 L
FEV1FVC-%Change-Post: 3 %
FEV1FVC-%PRED-PRE: 88 %
FEV6-%Change-Post: -1 %
FEV6-%PRED-POST: 88 %
FEV6-%PRED-PRE: 90 %
FEV6-Post: 3.63 L
FEV6-Pre: 3.69 L
FEV6FVC-%Pred-Post: 102 %
FEV6FVC-%Pred-Pre: 102 %
FVC-%Change-Post: -1 %
FVC-%PRED-POST: 86 %
FVC-%Pred-Pre: 88 %
FVC-Post: 3.63 L
FVC-Pre: 3.69 L
POST FEV6/FVC RATIO: 100 %
PRE FEV1/FVC RATIO: 72 %
PRE FEV6/FVC RATIO: 100 %
Post FEV1/FVC ratio: 74 %
RV % PRED: 85 %
RV: 1.56 L
TLC % PRED: 76 %
TLC: 5.14 L

## 2016-12-09 NOTE — Patient Instructions (Addendum)
Order- CXR   Dx sarcoid  Ok to remain on present meds. If you can, try keeping your prednisone dose a little below 10 mg (2 tabs) daily.  Please call as needed

## 2016-12-09 NOTE — Progress Notes (Signed)
PFT done today. 

## 2016-12-09 NOTE — Progress Notes (Signed)
HPI male/male transgender never smoker followed for Sarcoid, interstitial infiltrates, cough, GERD/aspiration pneumonia,complicated by history syphilis, HIV,  depression, DM2 PFT 12/09/16-mild obstruction and restriction without response to bronchodilator. FVC 3.63/86%, FEV1 2.69/78%, ratio 0.74, TLC 76%, DLCO 81% ------------------------------------------------------   09/02/16- 41 year old male/male transgender never smoker followed for Sarcoid interstitial infiltrates, cough, GERD/aspiration pneumonia, complicated by history syphilis, HIV,, depression, DM FOLLOWS FOR:Pt noticed inreased wheezing and tickle in throat causing increased cough. She has since went back to taking Prednisone 2 tabs daily and this seems to be helping slightly. Weight up to 328 pounds Has begun the application process to be considered for bariatric surgery. Realizes prednisone encourages weight gain. Had recurrence of cough on 10 mg daily prednisone so moved back up to 20 mg daily. There is still mild daily nonproductive cough without fever. Aware of reflux on waking in the morning. Dyspnea on exertion, short walks, blamed on weight. Has not had Qvar. Does have albuterol HFA, nebulizer machine and Singulair used intermittently. Complains of tight numbness along lateral hips from silicone injections. Questions if there is "inflammation". No pain. I suggested we get a CRP and sedimentation rate. CXR 06/15/16 IMPRESSION: 1. No acute infiltrates 2. Mildly prominent interstitial opacities at the lower lungs could relate to central airways inflammation.  12/09/16-  41 year old male/male transgender never smoker followed for Sarcoid interstitial infiltrates, cough, GERD/aspiration pneumonia, complicated by history syphilis, HIV,, depression, DM FOLLOWS FOR: Pt states she went back up to 2 tablets of Prednisone as she noticed her cough has started again. Inhaler was not working(Symbicort) She tried to stay on Symbicort and  get by with prednisone 15 mg daily, but dry cough returned and persists. Much more comfortable on prednisone 20 mg daily. She says sugars are "fine". Denies fever or adenopathy. Pneumonia was questioned early August and PCP treated with Levaquin which she says she has completed. No purulent sputum. Not recognizing reflux events. CXR 10/28/16 IMPRESSION: Mildly increased perihilar density on the right which may reflect atelectasis or early pneumonia. Asymmetric pulmonary edema is felt less likely. Followup PA and lateral chest X-ray is recommended in 3-4 weeks following trial of antibiotic therapy to ensure resolution and exclude underlying malignancy. PFT 12/09/16-mild obstruction and restriction without response to bronchodilator. FVC 3.63/86%, FEV1 2.69/78%, ratio 0.74, TLC 76%, DLCO 81%             ROS-see HPI   + = positive Constitutional:    weight loss, night sweats, fevers, chills, fatigue, lassitude. HEENT:    headaches, difficulty swallowing, tooth/dental problems, sore throat,       sneezing, itching, ear ache, nasal congestion, post nasal drip, snoring CV:    chest pain, orthopnea, PND, swelling in lower extremities, anasarca,                                                       dizziness, palpitations Resp:   + shortness of breath with exertion or at rest.                productive cough,   + non-productive cough, coughing up of blood.              change in color of mucus.  +wheezing.   Skin:    rash or lesions. GI:  No-   heartburn, indigestion, abdominal pain, nausea, vomiting,  GU: +  HPI MS:   joint pain, stiffness, decreased range of motion, back pain. Neuro-     nothing unusual Psych:  No-change in mood or affect.  +depression or anxiety.   memory loss.  OBJ- Physical Exam  +presents as male, + morbidly obese General- Alert, Oriented, Affect-appropriate, Distress- none acute Skin- rash-none, lesions- none, excoriation- none, + tattoos Lymphadenopathy- L axilla puffy,  tender Head- atraumatic            Eyes- Gross vision intact, PERRLA, conjunctivae and secretions clear            Ears- Hearing, canals-normal            Nose- Clear, no-Septal dev, mucus, polyps, erosion, perforation             Throat- Mallampati II , mucosa-clear , drainage- none, tonsils- atrophic Neck- flexible , trachea midline, no stridor , thyroid nl, carotid no bruit Chest - symmetrical excursion , unlabored           Heart/CV- RRR , no murmur , no gallop  , no rub, nl s1 s2                           - JVD- none , edema- none, stasis changes- none, varices- none           Lung- Clear, unlabored, cough-none , dullness-none, rub- none           Chest wall-  Abd-  Br/ Gen/ Rectal- Not done, not indicated Extrem- cyanosis- none, clubbing, none, atrophy- none, strength- nl Neuro- grossly intact to observation

## 2016-12-10 NOTE — Assessment & Plan Note (Signed)
Increased markings on CXR are difficult to assess because of overlying soft tissue shadows. There may be some increase of sarcoid-related markings. Plan-we discussed and accepted maintenance prednisone dose 20 mg daily for now with emphasis on push for lower doses gradually if tolerated. CXR updated.

## 2016-12-10 NOTE — Assessment & Plan Note (Signed)
PFT today shows obstructive pattern noted in 2015 has significantly improved. Maintenance prednisone has clearly helped, but with associated blood sugar concerns. Plan-CXR

## 2016-12-13 ENCOUNTER — Other Ambulatory Visit: Payer: Self-pay | Admitting: Family Medicine

## 2016-12-13 DIAGNOSIS — J4520 Mild intermittent asthma, uncomplicated: Secondary | ICD-10-CM

## 2017-01-14 ENCOUNTER — Other Ambulatory Visit: Payer: Self-pay | Admitting: Family Medicine

## 2017-01-14 DIAGNOSIS — J4520 Mild intermittent asthma, uncomplicated: Secondary | ICD-10-CM

## 2017-01-28 ENCOUNTER — Encounter: Payer: Self-pay | Admitting: Family Medicine

## 2017-01-28 ENCOUNTER — Ambulatory Visit (INDEPENDENT_AMBULATORY_CARE_PROVIDER_SITE_OTHER): Payer: Medicare Other | Admitting: Family Medicine

## 2017-01-28 DIAGNOSIS — R7303 Prediabetes: Secondary | ICD-10-CM | POA: Diagnosis not present

## 2017-01-28 DIAGNOSIS — E039 Hypothyroidism, unspecified: Secondary | ICD-10-CM

## 2017-01-28 DIAGNOSIS — I10 Essential (primary) hypertension: Secondary | ICD-10-CM | POA: Diagnosis not present

## 2017-01-28 LAB — TSH: TSH: 2.43 mIU/L (ref 0.40–4.50)

## 2017-01-28 LAB — POCT GLYCOSYLATED HEMOGLOBIN (HGB A1C): HEMOGLOBIN A1C: 5.6

## 2017-01-28 MED ORDER — LEVOTHYROXINE SODIUM 100 MCG PO TABS: ORAL_TABLET | ORAL | 0 refills | Status: DC

## 2017-01-28 NOTE — Progress Notes (Signed)
Subjective:    Patient ID: Robert Dawson, male    DOB: 1976-02-05, 41 y.o.   MRN: [id number]  [name], a 41 year old transgender MTF with a history of HIV, DMII, hypothyroidism, obesity, and intermittent asthma presents for a follow up chronic problems.  Patient feels well and is without complaint.  She is taking all medications consistently.  A referral she has increased her water intake 4-5 bottles per day. Patient is also requesting refills chronic medications.   Hypertension  This is a chronic problem. Associated symptoms include shortness of breath. Pertinent negatives include no anxiety, blurred vision, chest pain, headaches, neck pain, orthopnea or peripheral edema. Agents associated with hypertension include NSAIDs. Risk factors for coronary artery disease include obesity, smoking/tobacco exposure and sedentary lifestyle. The current treatment provides no improvement. There is no history of heart failure, left ventricular hypertrophy or retinopathy. Identifiable causes of hypertension include sleep apnea and a thyroid problem. There is no history of chronic renal disease.  Medication Refill  Pertinent negatives include no chest pain, headaches, neck pain, sore throat or swollen glands.    Past Medical History:  Diagnosis Date  . Allergy   . Depression   . Diabetes ([place]) 10/03/2015  . HIV disease ([place])   . Immune deficiency disorder [facility])    Social History   Social History  . Marital status: Single    Spouse name: N/A  . Number of children: N/A  . Years of education: N/A   Occupational History  . Not on file.   Social History Main Topics  . Smoking status: Never Smoker  . Smokeless tobacco: Never Used  . Alcohol use No  . Drug use: No  . Sexual activity: Not Currently    Partners: Male, Male    Birth control/ protection: Condom     Comment: given condoms. Pt is an under.   Other Topics Concern  . Not on file   Social History Narrative  . No  narrative on file   Allergies  Allergen Reactions  . Lisinopril Cough    Immunization History  Administered Date(s) Administered  . H1N1 05/09/2008  . Hepatitis B 04/22/2004, 08/04/2004, 10/27/2004  . Influenza Split 01/14/2011  . Influenza Whole 04/26/2006, 05/09/2008, 04/22/2009, 06/16/2010  . Influenza,inj,Quad PF,6+ Mos 12/12/2012, 01/31/2014, 12/30/2015  . Pneumococcal Polysaccharide-23 11/21/2003, 04/22/2009  . Tdap 01/31/2014   Review of Systems  HENT: Negative for ear pain, rhinorrhea and sore throat.   Eyes: Negative.  Negative for blurred vision.  Respiratory: Positive for shortness of breath. Negative for apnea.   Cardiovascular: Negative for chest pain and orthopnea.  Gastrointestinal: Negative.   Endocrine: Negative for polydipsia, polyphagia and polyuria.  Genitourinary: Negative.   Musculoskeletal: Negative.  Negative for neck pain.  Skin: Negative.   Allergic/Immunologic: Positive for environmental allergies and immunocompromised state (HIV +).  Neurological: Negative.  Negative for headaches.  Hematological: Negative.   Psychiatric/Behavioral: Negative.       Objective:   Physical Exam  Constitutional: He is oriented to person, place, and time. He appears well-developed and well-nourished.  Morbid obesity  HENT:  Head: Normocephalic and atraumatic.  Right Ear: External ear normal.  Left Ear: External ear normal.  Nose: Nose normal.  Mouth/Throat: Oropharynx is clear and moist.  Eyes: Pupils are equal, round, and reactive to light. Conjunctivae and EOM are normal.  Neck: Normal range of motion.  Cardiovascular: Normal rate, regular rhythm, normal heart sounds and intact distal pulses.   Pulmonary/Chest: Effort normal. He  has decreased breath sounds in the right lower field and the left lower field. He has no wheezes. He has no rhonchi. He has no rales.  Abdominal: Soft. Bowel sounds are normal.  Increased abdominal girth  Musculoskeletal: Normal range  of motion.  Neurological: He is alert and oriented to person, place, and time. He has normal reflexes.  Skin: Skin is warm and dry.  Psychiatric: He has a normal mood and affect. His behavior is normal. Judgment and thought content normal.     BP 120/83 (BP Location: Right Arm, Patient Position: Sitting, Cuff Size: Normal)   Pulse 100   Temp 98.5 F (36.9 C) (Oral)   Resp 18   Ht 5\' 9"  (1.753 m)   Wt (!) 317 lb (143.8 kg)   SpO2 96%   BMI 46.81 kg/m  Assessment & Plan:  1. MORBID OBESITY Recommend a lowfat, low carbohydrate diet divided over 5-6 small meals, increase water intake to 6-8 glasses, and 150 minutes per week of cardiovascular exercise.  Please follow up with bariatric services as schedule.   2. Essential hypertension Blood pressure is at goal on current medication regimen.  No medication changes warranted on today  3. Prediabetes Recommend a low-fat - POCT A1C  4. Hypothyroidism (acquired) - levothyroxine (SYNTHROID, LEVOTHROID) 100 MCG tablet; TAKE 1 TABLET(100 MCG) BY MOUTH DAILY BEFORE BREAKFAST  Dispense: 90 tablet; Refill: 0 - TSH   RTC: 6 months for chronic conditions   Donia Pounds  MSN, FNP-C Patient [facility] [address] [place], [place] [address] [phone]

## 2017-01-28 NOTE — Patient Instructions (Addendum)
Please complete forms services. We will follow-up by phone with any abnormal laboratory results Your blood pressure is at goal today, no medication changes warranted.  Hemoglobin A1c is 5.6 which is at goal, will continue metformin 500 mg daily with breakfast.   Recommend a lowfat, low carbohydrate diet divided over 5-6 small meals, increase water intake to 6-8 glasses, and 150 minutes per week of cardiovascular exercise.

## 2017-01-31 ENCOUNTER — Telehealth: Payer: Self-pay

## 2017-01-31 NOTE — Telephone Encounter (Signed)
-----   Message from Dorena Dew, [place] sent at 01/31/2017  9:10 AM EST ----- Regarding: lab results Please inform patient that thyroid levels normalized.  Will continue levothyroxine at current dosage.  No other medication changes warranted. Thanks

## 2017-01-31 NOTE — Telephone Encounter (Signed)
Called and spoke with patient. Advised that thyroid levels were in range and to continue same dosage of thyroid medication. Patient verbalized understanding. Thanks!

## 2017-02-14 ENCOUNTER — Other Ambulatory Visit: Payer: Self-pay

## 2017-02-14 ENCOUNTER — Other Ambulatory Visit: Payer: Self-pay | Admitting: Family Medicine

## 2017-02-14 DIAGNOSIS — J4520 Mild intermittent asthma, uncomplicated: Secondary | ICD-10-CM

## 2017-02-24 ENCOUNTER — Ambulatory Visit (INDEPENDENT_AMBULATORY_CARE_PROVIDER_SITE_OTHER): Payer: Medicare Other

## 2017-02-24 DIAGNOSIS — Z23 Encounter for immunization: Secondary | ICD-10-CM

## 2017-03-01 ENCOUNTER — Other Ambulatory Visit: Payer: Medicare Other

## 2017-03-07 ENCOUNTER — Other Ambulatory Visit: Payer: Self-pay

## 2017-03-09 ENCOUNTER — Other Ambulatory Visit: Payer: Medicare Other

## 2017-03-09 DIAGNOSIS — B20 Human immunodeficiency virus [HIV] disease: Secondary | ICD-10-CM

## 2017-03-10 LAB — FLUORESCENT TREPONEMAL AB(FTA)-IGG-BLD: FLUORESCENT TREPONEMAL ABS: REACTIVE — AB

## 2017-03-10 LAB — T-HELPER CELL (CD4) - (RCID CLINIC ONLY)
CD4 T CELL ABS: 600 /uL (ref 400–2700)
CD4 T CELL HELPER: 29 % — AB (ref 33–55)

## 2017-03-10 LAB — RPR TITER

## 2017-03-10 LAB — RPR: RPR: REACTIVE — AB

## 2017-03-11 LAB — HIV-1 RNA QUANT-NO REFLEX-BLD
HIV 1 RNA QUANT: 28 {copies}/mL — AB
HIV-1 RNA QUANT, LOG: 1.45 {Log_copies}/mL — AB

## 2017-03-12 ENCOUNTER — Other Ambulatory Visit: Payer: Self-pay | Admitting: Internal Medicine

## 2017-03-12 DIAGNOSIS — B20 Human immunodeficiency virus [HIV] disease: Secondary | ICD-10-CM

## 2017-03-13 ENCOUNTER — Other Ambulatory Visit: Payer: Self-pay | Admitting: Family Medicine

## 2017-03-13 DIAGNOSIS — M25559 Pain in unspecified hip: Secondary | ICD-10-CM

## 2017-03-13 DIAGNOSIS — J4520 Mild intermittent asthma, uncomplicated: Secondary | ICD-10-CM

## 2017-03-15 ENCOUNTER — Ambulatory Visit (INDEPENDENT_AMBULATORY_CARE_PROVIDER_SITE_OTHER): Payer: Medicare Other | Admitting: Internal Medicine

## 2017-03-15 DIAGNOSIS — B20 Human immunodeficiency virus [HIV] disease: Secondary | ICD-10-CM | POA: Diagnosis not present

## 2017-03-15 DIAGNOSIS — F321 Major depressive disorder, single episode, moderate: Secondary | ICD-10-CM | POA: Diagnosis not present

## 2017-03-15 DIAGNOSIS — R160 Hepatomegaly, not elsewhere classified: Secondary | ICD-10-CM

## 2017-03-15 LAB — COMPREHENSIVE METABOLIC PANEL
AG Ratio: 1.2 (calc) (ref 1.0–2.5)
ALT: 10 U/L (ref 9–46)
AST: 29 U/L (ref 10–40)
Albumin: 3.7 g/dL (ref 3.6–5.1)
Alkaline phosphatase (APISO): 75 U/L (ref 40–115)
BUN: 10 mg/dL (ref 7–25)
CO2: 27 mmol/L (ref 20–32)
Calcium: 9.2 mg/dL (ref 8.6–10.3)
Chloride: 107 mmol/L (ref 98–110)
Creat: 1.17 mg/dL (ref 0.60–1.35)
Globulin: 3.2 g/dL (calc) (ref 1.9–3.7)
Glucose, Bld: 94 mg/dL (ref 65–99)
Potassium: 4.5 mmol/L (ref 3.5–5.3)
Sodium: 141 mmol/L (ref 135–146)
Total Bilirubin: 0.3 mg/dL (ref 0.2–1.2)
Total Protein: 6.9 g/dL (ref 6.1–8.1)

## 2017-03-15 LAB — CBC
HCT: 40.9 % (ref 38.5–50.0)
Hemoglobin: 14 g/dL (ref 13.2–17.1)
MCH: 30.3 pg (ref 27.0–33.0)
MCHC: 34.2 g/dL (ref 32.0–36.0)
MCV: 88.5 fL (ref 80.0–100.0)
MPV: 11.2 fL (ref 7.5–12.5)
Platelets: 248 10*3/uL (ref 140–400)
RBC: 4.62 10*6/uL (ref 4.20–5.80)
RDW: 13.9 % (ref 11.0–15.0)
WBC: 7 10*3/uL (ref 3.8–10.8)

## 2017-03-15 NOTE — Assessment & Plan Note (Signed)
Her infection is under very good long-term control. She will continue her current medications and follow-up in 6 months.

## 2017-03-15 NOTE — Assessment & Plan Note (Signed)
She had a stable liver lesion of unknown cause on her repeat MRI several months ago. I will plan on repeating an MRI at the time of her next visit.

## 2017-03-15 NOTE — Assessment & Plan Note (Signed)
I will schedule a visit for her with Sande Rives, our mental health counselor.

## 2017-03-15 NOTE — Progress Notes (Signed)
Patient Active Problem List   Diagnosis Date Noted  . Sarcoidosis of lung ([place]) 01/31/2014    Priority: High  . Human immunodeficiency virus (HIV) disease ([place]) 04/26/2006    Priority: High  . Depression 04/26/2006    Priority: Medium  . Hip pain, bilateral 04/27/2016  . Urine leukocytes 04/27/2016  . Weight gain 04/27/2016  . Urinary hesitancy 04/27/2016  . Liver mass 04/05/2016  . Abdominal pain 03/09/2016  . 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 GERD (gastroesophageal reflux disease) 08/15/2014  . Pulmonary infiltrates 07/25/2014  . Bronchitis, chronic obstructive ([place]) 07/16/2014  . SOB (shortness of breath) 02/04/2014  . Syncope 02/04/2014  . Immunization due 01/31/2014  . Tachycardia 01/31/2014  . Generalized swelling, mass, or lump of abdomen or pelvis 09/21/2013  . Other allergic rhinitis 09/21/2013  . Essential hypertension 09/21/2013  . Preventative health care 09/21/2013  . Wheezing 08/07/2013  . Peripheral neuropathy 08/31/2010  . MOLE 10/14/2009  . SKIN RASH 10/14/2009  . MORBID OBESITY 04/22/2009  . 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 ALLERGIC RHINITIS 04/26/2006  . Hilar adenopathy 04/26/2006      Medication List        Accurate as of 03/15/17 11:45 AM. Always use your most recent med list.          amLODipine 5 MG tablet Commonly known as:  NORVASC Take 1 tablet (5 mg total) by mouth daily.   beclomethasone 40 MCG/ACT inhaler Commonly known as:  QVAR REDIHALER Inhale 1 puff into the lungs daily.   blood glucose meter kit and supplies Kit Dispense based on patient and insurance preference. Use up to four times daily as directed. (FOR ICD-9 250.00, 250.01).   Compressor Nebulizer Misc 1 Device by Does not apply route every 6 (six) hours as needed.   DESCOVY 200-25 MG tablet Generic drug:  emtricitabine-tenofovir AF TAKE 1 TABLET BY MOUTH DAILY     levothyroxine 100 MCG tablet Commonly known as:  SYNTHROID, LEVOTHROID TAKE 1 TABLET(100 MCG) BY MOUTH DAILY BEFORE BREAKFAST   metFORMIN 500 MG tablet Commonly known as:  GLUCOPHAGE Take 1 tablet (500 mg total) by mouth daily with breakfast.   montelukast 10 MG tablet Commonly known as:  SINGULAIR Take 1 tablet (10 mg total) by mouth at bedtime.   naproxen 500 MG tablet Commonly known as:  NAPROSYN TAKE 1 TABLET(500 MG) BY MOUTH TWICE DAILY WITH A MEAL   pantoprazole 20 MG tablet Commonly known as:  PROTONIX TAKE 1 TABLET(20 MG) BY MOUTH DAILY   predniSONE 10 MG tablet Commonly known as:  DELTASONE TAKE 1 TABLET BY MOUTH DAILY OR AS DIRECTED   TIVICAY 50 MG tablet Generic drug:  dolutegravir TAKE 1 TABLET BY MOUTH DAILY   VENTOLIN HFA 108 (90 Base) MCG/ACT inhaler Generic drug:  albuterol INHALE 2 PUFFS INTO THE LUNGS EVERY 6 HOURS AS NEEDED WHEEZING OR SHORTNESS OF BREATH       Subjective: [name] is in for her routine HIV follow-up visit. She has not had any problems obtaining, taking or tolerating her Descovy and Tivicay. She denies missing any doses. She has not been measuring her blood sugars at home because she does not have supplies but recalls that she was told her diabetes is under good control when she saw her primary care provider last month. She tells me that she is now only taking 10 mg of prednisone for  her sarcoidosis although her last visit with Dr. Annamaria Boots in September indicated she was going to stay on 20 mg. She has very minor, intermittent dry cough but otherwise her breathing is fine. She remains depressed. She has not been sexually active with her ex-boyfriend or anyone else since her last visit. She is still very focused about the fact that she believes he gave her HIVand never told her he was positive.  Review of Systems: Review of Systems  Constitutional: Positive for weight loss. Negative for chills, diaphoresis, fever and malaise/fatigue.  HENT:  Negative for sore throat.   Respiratory: Negative for cough, sputum production and shortness of breath.   Cardiovascular: Negative for chest pain.  Gastrointestinal: Negative for abdominal pain, diarrhea, heartburn, nausea and vomiting.  Genitourinary: Negative for dysuria and frequency.  Musculoskeletal: Negative for joint pain and myalgias.  Skin: Negative for rash.  Neurological: Negative for dizziness and headaches.  Psychiatric/Behavioral: Positive for depression. Negative for substance abuse. The patient is not nervous/anxious.     Past Medical History:  Diagnosis Date  . Allergy   . Depression   . Diabetes ([place]) 10/03/2015  . HIV disease ([place])   . Immune deficiency disorder [facility])     Social History   Tobacco Use  . Smoking status: Never Smoker  . Smokeless tobacco: Never Used  Substance Use Topics  . Alcohol use: No    Alcohol/week: 0.0 oz  . Drug use: No    Family History  Problem Relation Age of Onset  . Diabetes Maternal Grandmother   . Hypertension Maternal Grandmother     Allergies  Allergen Reactions  . Lisinopril Cough    Health Maintenance  Topic Date Due  . PAP SMEAR  04/20/1995  . OPHTHALMOLOGY EXAM  03/17/2017  . FOOT EXAM  04/27/2017  . URINE MICROALBUMIN  07/27/2017  . HEMOGLOBIN A1C  07/28/2017  . TETANUS/TDAP  02/01/2024  . PNEUMOCOCCAL POLYSACCHARIDE VACCINE  Completed  . HIV Screening  Completed    Objective:  Vitals:   03/15/17 1059  BP: (!) 138/97  Pulse: 97  Temp: 97.9 F (36.6 C)  TempSrc: Oral  Weight: (!) 318 lb (144.2 kg)   Body mass index is 46.96 kg/m.  Physical Exam  Constitutional: She is oriented to person, place, and time.  HENT:  Mouth/Throat: No oropharyngeal exudate.  Eyes: Conjunctivae are normal.  Cardiovascular: Normal rate and regular rhythm.  No murmur heard. Pulmonary/Chest: Breath sounds normal.  Abdominal: Soft. She exhibits no mass. There is no tenderness.  Musculoskeletal: Normal range of  motion.  Neurological: She is alert and oriented to person, place, and time.  Skin: No rash noted.  Psychiatric: Mood normal.    Lab Results Lab Results  Component Value Date   WBC 9.6 09/02/2016   HGB 13.9 09/02/2016   HCT 41.5 09/02/2016   MCV 90.6 09/02/2016   PLT 219.0 09/02/2016    Lab Results  Component Value Date   CREATININE 1.27 04/27/2016   BUN 16 04/27/2016   NA 138 04/27/2016   K 4.4 04/27/2016   CL 103 04/27/2016   CO2 26 04/27/2016    Lab Results  Component Value Date   ALT 14 04/01/2016   AST 31 04/01/2016   ALKPHOS 78 04/01/2016   BILITOT 0.4 04/01/2016    Lab Results  Component Value Date   CHOL 151 10/20/2015   HDL 40 10/20/2015   LDLCALC 87 10/20/2015   TRIG 120 10/20/2015   CHOLHDL 3.8 10/20/2015   Lab  Results  Component Value Date   LABRPR REACTIVE (A) 03/09/2017   RPRTITER 1:8 (H) 03/09/2017   HIV 1 RNA Quant (copies/mL)  Date Value  03/09/2017 28 (H)  08/26/2016 <20 DETECTED (A)  04/01/2016 CANCELED   CD4 T Cell Abs (/uL)  Date Value  03/09/2017 600  08/26/2016 510  02/24/2016 570     Problem List Items Addressed This Visit      High   Human immunodeficiency virus (HIV) disease ([place])    Her infection is under very good long-term control. She will continue her current medications and follow-up in 6 months.      Relevant Orders   CBC   Comprehensive metabolic panel   T-helper cell (CD4)- ([facility] clinic only)   HIV 1 RNA quant-no reflex-bld   CBC   Comprehensive metabolic panel   Lipid panel   RPR     Medium   Depression    I will schedule a visit for her with Sande Rives, our mental health counselor.        Unprioritized   Liver mass    She had a stable liver lesion of unknown cause on her repeat MRI several months ago. I will plan on repeating an MRI at the time of her next visit.           Michel Bickers, MD [facility] [place] [facility] [phone] pager   [phone] cell 03/15/2017, 11:45 AM

## 2017-03-23 ENCOUNTER — Other Ambulatory Visit: Payer: Self-pay | Admitting: Internal Medicine

## 2017-03-24 ENCOUNTER — Other Ambulatory Visit: Payer: Self-pay

## 2017-03-24 DIAGNOSIS — E119 Type 2 diabetes mellitus without complications: Secondary | ICD-10-CM

## 2017-03-24 DIAGNOSIS — E118 Type 2 diabetes mellitus with unspecified complications: Secondary | ICD-10-CM

## 2017-03-24 MED ORDER — METFORMIN HCL 500 MG PO TABS: 500.0000 mg | ORAL_TABLET | Freq: Every day | ORAL | 1 refills | Status: DC

## 2017-03-25 ENCOUNTER — Other Ambulatory Visit: Payer: Self-pay

## 2017-04-06 ENCOUNTER — Other Ambulatory Visit: Payer: Self-pay | Admitting: Internal Medicine

## 2017-04-06 ENCOUNTER — Other Ambulatory Visit: Payer: Self-pay | Admitting: Family Medicine

## 2017-04-06 DIAGNOSIS — E039 Hypothyroidism, unspecified: Secondary | ICD-10-CM

## 2017-04-11 ENCOUNTER — Ambulatory Visit (INDEPENDENT_AMBULATORY_CARE_PROVIDER_SITE_OTHER)
Admission: RE | Admit: 2017-04-11 | Discharge: 2017-04-11 | Disposition: A | Discharge status: Home / Self Care | Payer: Medicare Other | Source: Ambulatory Visit | Attending: Internal Medicine | Admitting: Internal Medicine

## 2017-04-11 ENCOUNTER — Ambulatory Visit (INDEPENDENT_AMBULATORY_CARE_PROVIDER_SITE_OTHER): Payer: Medicare Other | Admitting: Internal Medicine

## 2017-04-11 ENCOUNTER — Encounter: Payer: Self-pay | Admitting: Internal Medicine

## 2017-04-11 ENCOUNTER — Other Ambulatory Visit: Payer: Medicare Other

## 2017-04-11 VITALS — BP 128/64 | HR 108 | Ht 69.0 in | Wt 314.6 lb

## 2017-04-11 DIAGNOSIS — D860 Sarcoidosis of lung: Secondary | ICD-10-CM

## 2017-04-11 DIAGNOSIS — J209 Acute bronchitis, unspecified: Secondary | ICD-10-CM

## 2017-04-11 DIAGNOSIS — J449 Chronic obstructive pulmonary disease, unspecified: Secondary | ICD-10-CM | POA: Diagnosis not present

## 2017-04-11 NOTE — Patient Instructions (Signed)
Order- lab- ACE level     Dx sarcoid  Order- CXR   Acute bronchitis, sarcoid  Referral to Bariatric program

## 2017-04-11 NOTE — Assessment & Plan Note (Signed)
Exacerbation of bronchitis.  In the past this is been a combination of active sarcoid and strongly suspected reflux with aspiration.  She remains on low-dose maintenance prednisone. Plan CXR, ACE level.  Consider antibiotic if symptoms persist.

## 2017-04-11 NOTE — Assessment & Plan Note (Signed)
We need to assess for sarcoid activity.  Acute illness over the last 7-10 days is more consistent with respiratory infection at this time of year. Plan-ACE level CXR

## 2017-04-11 NOTE — Progress Notes (Signed)
HPI male/male transgender never smoker followed for Sarcoid, interstitial infiltrates, cough, GERD/aspiration pneumonia,complicated by history syphilis, HIV,  depression, DM2 PFT 12/09/16-mild obstruction and restriction without response to bronchodilator. FVC 3.63/86%, FEV1 2.69/78%, ratio 0.74, TLC 76%, DLCO 81% ------------------------------------------------------   12/09/16-  42 year old male/male transgender never smoker followed for Sarcoid interstitial infiltrates, cough, GERD/aspiration pneumonia, complicated by history syphilis, HIV,, depression, DM FOLLOWS FOR: Pt states she went back up to 2 tablets of Prednisone as she noticed her cough has started again. Inhaler was not working(Symbicort) She tried to stay on Symbicort and get by with prednisone 15 mg daily, but dry cough returned and persists. Much more comfortable on prednisone 20 mg daily. She says sugars are "fine". Denies fever or adenopathy. Pneumonia was questioned early August and PCP treated with Levaquin which she says she has completed. No purulent sputum. Not recognizing reflux events. CXR 10/28/16 IMPRESSION: Mildly increased perihilar density on the right which may reflect atelectasis or early pneumonia. Asymmetric pulmonary edema is felt less likely. Followup PA and lateral chest X-ray is recommended in 3-4 weeks following trial of antibiotic therapy to ensure resolution and exclude underlying malignancy. PFT 12/09/16-mild obstruction and restriction without response to bronchodilator. FVC 3.63/86%, FEV1 2.69/78%, ratio 0.74, TLC 76%, DLCO 81%  04/11/17- 42 year old male/male transgender never smoker followed for Sarcoid interstitial infiltrates, cough, GERD/aspiration pneumonia, complicated by history syphilis, HIV,, depression, DM ----Sarcoidosis: Pt is having flare up-congestion, rattles in chest, cough as well.  For the last week and a half has been noticing chest rattle and congestion without fever or sore  throat, adenopathy or rash.  Sputum scant white Has not recognized a reflux event. Had flu vax.             ROS-see HPI   + = positive Constitutional:    weight loss, night sweats, fevers, chills, fatigue, lassitude. HEENT:    headaches, difficulty swallowing, tooth/dental problems, sore throat,       sneezing, itching, ear ache, nasal congestion, post nasal drip, snoring CV:    chest pain, orthopnea, PND, swelling in lower extremities, anasarca,                             dizziness, palpitations Resp:   + shortness of breath with exertion or at rest.                +productive cough,   + non-productive cough, coughing up of blood.              change in color of mucus.  +wheezing.   Skin:    rash or lesions. GI:  No-   heartburn, indigestion, abdominal pain, nausea, vomiting,  GU: + HPI MS:   joint pain, stiffness, decreased range of motion, back pain. Neuro-     nothing unusual Psych:  No-change in mood or affect.  +depression or anxiety.   memory loss.  OBJ- Physical Exam  +presents as male, + morbidly obese General- Alert, Oriented, Affect-appropriate, Distress- none acute Skin- rash-none, lesions- none, excoriation- none, + tattoos Lymphadenopathy- L axilla puffy, tender Head- atraumatic            Eyes- Gross vision intact, PERRLA, conjunctivae and secretions clear            Ears- Hearing, canals-normal            Nose- Clear, no-Septal dev, mucus, polyps, erosion, perforation  Throat- Mallampati II , mucosa-clear , drainage- none, tonsils- atrophic Neck- flexible , trachea midline, no stridor , thyroid nl, carotid no bruit Chest - symmetrical excursion , unlabored           Heart/CV- RRR , no murmur , no gallop  , no rub, nl s1 s2                           - JVD- none , edema- none, stasis changes- none, varices- none           Lung-+coarse breath sounds with mild wheeze, unlabored, cough-none , dullness-none, rub- none           Chest wall-  Abd-  Br/ Gen/  Rectal- Not done, not indicated Extrem- cyanosis- none, clubbing, none, atrophy- none, strength- nl Neuro- grossly intact to observation

## 2017-04-12 LAB — ANGIOTENSIN CONVERTING ENZYME: ANGIOTENSIN-CONVERTING ENZYME: 135 U/L — AB (ref 9–67)

## 2017-04-13 ENCOUNTER — Other Ambulatory Visit: Payer: Self-pay | Admitting: *Deleted

## 2017-04-13 MED ORDER — PREDNISONE 20 MG PO TABS: 20.0000 mg | ORAL_TABLET | Freq: Every day | ORAL | 0 refills | Status: DC

## 2017-05-06 ENCOUNTER — Other Ambulatory Visit: Payer: Self-pay | Admitting: Family Medicine

## 2017-05-06 DIAGNOSIS — M25559 Pain in unspecified hip: Secondary | ICD-10-CM

## 2017-05-31 ENCOUNTER — Other Ambulatory Visit: Payer: Self-pay | Admitting: Internal Medicine

## 2017-06-06 ENCOUNTER — Other Ambulatory Visit: Payer: Self-pay | Admitting: Family Medicine

## 2017-06-06 DIAGNOSIS — J4520 Mild intermittent asthma, uncomplicated: Secondary | ICD-10-CM

## 2017-06-24 ENCOUNTER — Other Ambulatory Visit: Payer: Self-pay | Admitting: Family Medicine

## 2017-06-24 ENCOUNTER — Ambulatory Visit (INDEPENDENT_AMBULATORY_CARE_PROVIDER_SITE_OTHER): Payer: Medicare Other | Admitting: Family Medicine

## 2017-06-24 ENCOUNTER — Encounter: Payer: Self-pay | Admitting: Family Medicine

## 2017-06-24 VITALS — BP 138/88 | HR 85 | Temp 97.8°F | Resp 16 | Ht 69.0 in | Wt 316.0 lb

## 2017-06-24 DIAGNOSIS — R1032 Left lower quadrant pain: Secondary | ICD-10-CM

## 2017-06-24 DIAGNOSIS — Z6841 Body Mass Index (BMI) 40.0 and over, adult: Secondary | ICD-10-CM | POA: Diagnosis not present

## 2017-06-24 DIAGNOSIS — D860 Sarcoidosis of lung: Secondary | ICD-10-CM

## 2017-06-24 DIAGNOSIS — E039 Hypothyroidism, unspecified: Secondary | ICD-10-CM | POA: Diagnosis not present

## 2017-06-24 DIAGNOSIS — I10 Essential (primary) hypertension: Secondary | ICD-10-CM

## 2017-06-24 DIAGNOSIS — K7689 Other specified diseases of liver: Secondary | ICD-10-CM

## 2017-06-24 DIAGNOSIS — R7303 Prediabetes: Secondary | ICD-10-CM | POA: Diagnosis not present

## 2017-06-24 DIAGNOSIS — R339 Retention of urine, unspecified: Secondary | ICD-10-CM

## 2017-06-24 DIAGNOSIS — B20 Human immunodeficiency virus [HIV] disease: Secondary | ICD-10-CM

## 2017-06-24 DIAGNOSIS — Z202 Contact with and (suspected) exposure to infections with a predominantly sexual mode of transmission: Secondary | ICD-10-CM

## 2017-06-24 LAB — POCT URINALYSIS DIP (DEVICE)
Bilirubin Urine: NEGATIVE
Glucose, UA: NEGATIVE mg/dL
Ketones, ur: NEGATIVE mg/dL
NITRITE: NEGATIVE
PH: 5.5 (ref 5.0–8.0)
PROTEIN: NEGATIVE mg/dL
Specific Gravity, Urine: 1.025 (ref 1.005–1.030)
UROBILINOGEN UA: 0.2 mg/dL (ref 0.0–1.0)

## 2017-06-24 MED ORDER — NAPROXEN 500 MG PO TABS: 500.0000 mg | ORAL_TABLET | Freq: Two times a day (BID) | ORAL | 0 refills | Status: DC

## 2017-06-24 NOTE — Patient Instructions (Signed)
We will follow-up by phone with any abnormal laboratory results. Your blood pressure is at goal on current medication regimen, no changes warranted.  - Continue medication, monitor blood pressure at home. Continue DASH diet. Reminder to go to the ER if any CP, SOB, nausea, dizziness, severe HA, changes vision/speech, left arm numbness and tingling and jaw pain.  We will reviewed urology notes as they become available.  Your MRI has been scheduled for history of lung nodule.  I have sent a new referral to bariatric surgery.  I recommend that you continue a low-fat low carbohydrate diet divided over small meals throughout the day.  I did not recommend consuming 1 meal a day.  Typically one small meal will decrease metabolism and lead to weight gain.

## 2017-06-24 NOTE — Progress Notes (Signed)
Subjective:    Patient ID: [name], adult    DOB: [birth year], 42 y.o.   MRN: [id number] Hayk "Gerre Couch, a 42 year old transgender MTF with a history of HIV, DMII, hypothyroidism, obesity, and intermittent asthma presents for a follow up of chronic conditions. She has been taking all prescribed medications consistently.  Patient says that she has been following a low fat diet. She has not been exercising routinely. She does not check blood pressure at home. She continues to follow up with pulmonology consistently for sarcoidosis. . She denies chest pain, fever, fatigue, nausea, vomiting, or diarrhea.   [name] continues to complain of weight gain. Patient has had a 70 pound weight gain over the past 2 years. She has been referred to nutrition and diabetes management and has had diet plans without success. She has been unable to tolerate exercise due to sarcoidosis. She is requesting bariatric surgery.   Patient has a history of groin pain over the past several weeks. Patient endorses occasional unprotected sexual intercourse. He also reports a history of urinary retention. He has been taking flomax consistently. He denies urinary urgency, dysuria, incontinence, or low back pain. Patient was previously referred to urology for this problem. He says that no further interventions were prescribed.   Patient complains of obesity. Patient cites breathing difficulty and chronic conditions as reasons for wanting to lose weight.  Obesity History Weight in late teens: 120 lb. Period of greatest weight gain: 373 lb during 30's Lowest adult weight: 125 Highest adult weight: 373    History of Weight Loss Efforts Greatest amount of weight lost: 50 lb over  several months Amount of time that loss was maintained:  5 months Circumstances associated with regain of weight: Starting prednisone Successful weight loss techniques attempted: very low calorie diet   Current Exercise Habits  none  Current Eating Habits Number of regular meals per day: 1 Number of snacking episodes per day: 0 Who shops for food? She shops for food.  Who prepares food? She prepares food Who eats with patient? Friends, brother and cousin Binge behavior?: Yes Purge behavior? No Eating precipitated by stress? Sometimes Guilt feelings associated with eating? Yes  Other Potential Contributing Factors Use of alcohol: average: No Use of medications that may cause weight gain corticosteroids (prednisone) History of past abuse? none Psych History: anxiety, tobacco use and body dysmorphic disorder Comorbidities: diabetes mellitus and dyslipidemias   Groin Pain  Associated symptoms include coughing. Pertinent negatives include no chest pain, chills or fever.   Past Medical History:  Diagnosis Date  . Allergy   . Depression   . Diabetes ([place]) 10/03/2015  . HIV disease ([place])   . Immune deficiency disorder [facility])    Social History   Socioeconomic History  . Marital status: Single    Spouse name: Not on file  . Number of children: Not on file  . Years of education: Not on file  . Highest education level: Not on file  Occupational History  . Not on file  Social Needs  . Financial resource strain: Not on file  . Food insecurity:    Worry: Not on file    Inability: Not on file  . Transportation needs:    Medical: Not on file    Non-medical: Not on file  Tobacco Use  . Smoking status: Never Smoker  . Smokeless tobacco: Never Used  Substance and Sexual Activity  . Alcohol use: No    Alcohol/week: 0.0 oz  . Drug  use: No  . Sexual activity: Not Currently    Partners: Male, Male    Birth control/protection: Condom    Comment: given condoms. Pt is an under.  Lifestyle  . Physical activity:    Days per week: Not on file    Minutes per session: Not on file  . Stress: Not on file  Relationships  . Social connections:    Talks on phone: Not on file    Gets together: Not on file     Attends religious service: Not on file    Active member of club or organization: Not on file    Attends meetings of clubs or organizations: Not on file    Relationship status: Not on file  . Intimate partner violence:    Fear of current or ex partner: Not on file    Emotionally abused: Not on file    Physically abused: Not on file    Forced sexual activity: Not on file  Other Topics Concern  . Not on file  Social History Narrative  . Not on file   Allergies  Allergen Reactions  . Lisinopril Cough    Immunization History  Administered Date(s) Administered  . H1N1 05/09/2008  . Hepatitis B 04/22/2004, 08/04/2004, 10/27/2004  . Influenza Split 01/14/2011  . Influenza Whole 04/26/2006, 05/09/2008, 04/22/2009, 06/16/2010  . Influenza,inj,Quad PF,6+ Mos 12/12/2012, 01/31/2014, 12/30/2015, 02/24/2017  . Pneumococcal Polysaccharide-23 11/21/2003, 04/22/2009  . Tdap 01/31/2014   Review of Systems  Constitutional: Positive for unexpected weight change (weight gain). Negative for chills and fever.  HENT: Negative for ear pain and rhinorrhea.   Eyes: Negative.   Respiratory: Positive for cough. Negative for wheezing.   Cardiovascular: Negative for chest pain.  Gastrointestinal: Negative.   Endocrine: Negative for polydipsia, polyphagia and polyuria.  Genitourinary: Negative.   Musculoskeletal: Negative.   Skin: Negative.   Allergic/Immunologic: Positive for environmental allergies and immunocompromised state (HIV +).  Neurological: Negative.   Hematological: Negative.   Psychiatric/Behavioral: Negative.       Objective:   Physical Exam  Constitutional: She is oriented to person, place, and time. She appears well-developed and well-nourished.  Morbid obesity  HENT:  Head: Normocephalic and atraumatic.  Right Ear: External ear normal.  Left Ear: External ear normal.  Nose: Nose normal.  Mouth/Throat: Oropharynx is clear and moist.  Eyes: Pupils are equal, round, and reactive  to light. Conjunctivae and EOM are normal.  Neck: Normal range of motion.  Cardiovascular: Normal rate, regular rhythm, normal heart sounds and intact distal pulses.  Pulmonary/Chest: Effort normal. She has no decreased breath sounds. She has no wheezes. She has no rhonchi. She has no rales.  Abdominal: Soft. Bowel sounds are normal.  Increased abdominal girth  Musculoskeletal: Normal range of motion.  Neurological: She is alert and oriented to person, place, and time. She has normal reflexes.  Skin: Skin is warm and dry.  Psychiatric: She has a normal mood and affect. Her behavior is normal. Judgment and thought content normal.     BP 138/88 (BP Location: Right Arm, Patient Position: Sitting, Cuff Size: Large)   Pulse 85   Temp 97.8 F (36.6 C) (Oral)   Resp 16   Ht 5\' 9"  (1.753 m)   Wt (!) 316 lb (143.3 kg)   SpO2 100%   BMI 46.67 kg/m  Assessment & Plan:   Morbid obesity ([place]) Patient has had a 70 pound weight gain over the past 2 years. She is requesting bariatric surgery. I previously sent  a referral to bariatric surgery. Patient says that she has not been notified to schedule an appointment. She has been to nutritionist and has attempted to make dietary changes.  She is to continue to follow with diabetes and nutrition. Recommend a lowfat, low carbohydrate diet divided over 5-6 small meals, increase water intake to 6-8 glasses, and increase daily activity.  Also, a previous referral was sent to psychiatry for history of body dysmorphic disorder.  [name] states that she is followed up with psychiatry.  Prediabetes - POCT urinalysis dip (device) - HgB A1c  Possible exposure to STD - GC/Chlamydia Probe Amp([facility])   Morbid obesity with BMI of 45.0-49.9, adult (HCC) - Amb Referral to Bariatric Surgery  Urinary retention - POCT urinalysis dip (device) Will add on urine culture Liver nodule IMPRESSION: Stable appearing indeterminate lesion within the left hepatic  lobe. Recommend an additional follow-up abdominal MRI with and without contrast in 6 months to ensure continued stability.  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  Will repeat MRI - MR Abdomen W Wo Contrast; Future  Human immunodeficiency virus (HIV) disease ([place]) Continue to follow up with [name] as scheduled  Sarcoidosis of lung [facility]) Continue to follow up with pulmonology as scheduled.   Left groin pain Patient was previously sent to urology for this problem. Will contact urology for notes. Will review notes at they become available Urine leukocytes will follow urine culture.   Acquired hypothyroidism  - TSH  Essential hypertension Blood pressure is at goal on current medication regimen. No medication changes warranted on today.  - POCT urinalysis dip (device) - Basic Metabolic Panel - CBC  RTC: Follow up as previously scheduled   Donia Pounds  MSN, FNP-C Patient [facility] [address] [place], [place] [address] [phone]

## 2017-06-25 LAB — BASIC METABOLIC PANEL
BUN/Creatinine Ratio: 9 (ref 9–20)
BUN: 12 mg/dL (ref 6–24)
CO2: 23 mmol/L (ref 20–29)
CREATININE: 1.27 mg/dL (ref 0.76–1.27)
Calcium: 9.3 mg/dL (ref 8.7–10.2)
Chloride: 101 mmol/L (ref 96–106)
GFR calc non Af Amer: 69 mL/min/{1.73_m2} (ref >59)
GFR, EST AFRICAN AMERICAN: 79 mL/min/{1.73_m2} (ref >59)
Glucose: 98 mg/dL (ref 65–99)
POTASSIUM: 4.6 mmol/L (ref 3.5–5.2)
SODIUM: 141 mmol/L (ref 134–144)

## 2017-06-25 LAB — CBC
Hematocrit: 41.8 % (ref 37.5–51.0)
Hemoglobin: 13.8 g/dL (ref 13.0–17.7)
MCH: 29.4 pg (ref 26.6–33.0)
MCHC: 33 g/dL (ref 31.5–35.7)
MCV: 89 fL (ref 79–97)
PLATELETS: 294 10*3/uL (ref 150–379)
RBC: 4.69 x10E6/uL (ref 4.14–5.80)
RDW: 14.6 % (ref 12.3–15.4)
WBC: 7 10*3/uL (ref 3.4–10.8)

## 2017-06-25 LAB — TSH: TSH: 1.54 u[IU]/mL (ref 0.450–4.500)

## 2017-06-28 ENCOUNTER — Ambulatory Visit (HOSPITAL_COMMUNITY): Admission: RE | Admit: 2017-06-28 | Payer: Medicare Other | Source: Ambulatory Visit

## 2017-06-28 ENCOUNTER — Telehealth: Payer: Self-pay

## 2017-06-28 NOTE — Telephone Encounter (Signed)
Called and spoke with patient. Informed that all labs were within normal range. Asked that patient continue to eat a low fat/low carb diet to help assist with weight loss efforts. I informed that we are sending a referral to [facility] Bariatric Surgery for consult. Thanks!

## 2017-06-28 NOTE — Telephone Encounter (Signed)
-----   Message from Dorena Dew,  sent at 06/25/2017 10:09 AM EDT ----- Regarding: lab results Please inform patient that all labs are within a normal range. No further medication changes warranted at this time. Continue a low fat/low carbohydrate diet and low impact exercise regimen to assist with weight loss efforts. I have sent a referral to [facility] Bariatric Surgery for consult. Follow up in office as scheduled.   Donia Pounds  MSN, FNP-C Patient [facility] [address] [place], [place] [address] [phone]

## 2017-07-01 LAB — GC/CHLAMYDIA PROBE AMP
Chlamydia trachomatis, NAA: NEGATIVE
Neisseria gonorrhoeae by PCR: NEGATIVE

## 2017-07-04 ENCOUNTER — Telehealth: Payer: Self-pay

## 2017-07-04 NOTE — Telephone Encounter (Signed)
Called and spoke with patient, patient missed mri appointment and needed to rescheduled. I have provided number to scheduling to make new appointment. Thanks!

## 2017-07-05 ENCOUNTER — Other Ambulatory Visit: Payer: Self-pay | Admitting: Internal Medicine

## 2017-07-05 ENCOUNTER — Other Ambulatory Visit: Payer: Self-pay | Admitting: Family Medicine

## 2017-07-05 DIAGNOSIS — J4520 Mild intermittent asthma, uncomplicated: Secondary | ICD-10-CM

## 2017-07-05 DIAGNOSIS — B20 Human immunodeficiency virus [HIV] disease: Secondary | ICD-10-CM

## 2017-07-14 ENCOUNTER — Ambulatory Visit (HOSPITAL_COMMUNITY): Payer: Medicare Other | Attending: Family Medicine

## 2017-07-20 ENCOUNTER — Ambulatory Visit (HOSPITAL_COMMUNITY)
Admission: RE | Admit: 2017-07-20 | Discharge: 2017-07-20 | Disposition: A | Discharge status: Home / Self Care | Payer: Medicare Other | Source: Ambulatory Visit | Attending: Family Medicine | Admitting: Family Medicine

## 2017-07-20 DIAGNOSIS — K7689 Other specified diseases of liver: Secondary | ICD-10-CM | POA: Diagnosis present

## 2017-07-20 MED ORDER — GADOXETATE DISODIUM 0.25 MMOL/ML IV SOLN
10.0000 mL | Freq: Once | INTRAVENOUS | Status: AC | PRN
  Administered 2017-07-20: 10 mL via INTRAVENOUS

## 2017-07-28 ENCOUNTER — Telehealth: Payer: Self-pay

## 2017-07-28 ENCOUNTER — Ambulatory Visit: Payer: Self-pay | Admitting: Family Medicine

## 2017-07-28 NOTE — Telephone Encounter (Signed)
Patient has paper work she wants filled out for bypass surgery, I advised to bring in during next appointment. Thanks!

## 2017-07-29 ENCOUNTER — Encounter: Payer: Self-pay | Admitting: Family Medicine

## 2017-07-29 ENCOUNTER — Ambulatory Visit (INDEPENDENT_AMBULATORY_CARE_PROVIDER_SITE_OTHER): Payer: Medicare Other | Admitting: Family Medicine

## 2017-07-29 NOTE — Patient Instructions (Signed)
Exercising to Lose Weight Exercising can help you to lose weight. In order to lose weight through exercise, you need to do vigorous-intensity exercise. You can tell that you are exercising with vigorous intensity if you are breathing very hard and fast and cannot hold a conversation while exercising. Moderate-intensity exercise helps to maintain your current weight. You can tell that you are exercising at a moderate level if you have a higher heart rate and faster breathing, but you are still able to hold a conversation. How often should I exercise? Choose an activity that you enjoy and set realistic goals. Your health care provider can help you to make an activity plan that works for you. Exercise regularly as directed by your health care provider. This may include:  Doing resistance training twice each week, such as: ? Push-ups. ? Sit-ups. ? Lifting weights. ? Using resistance bands.  Doing a given intensity of exercise for a given amount of time. Choose from these options: ? 150 minutes of moderate-intensity exercise every week. ? 75 minutes of vigorous-intensity exercise every week. ? A mix of moderate-intensity and vigorous-intensity exercise every week.  Children, pregnant women, people who are out of shape, people who are overweight, and older adults may need to consult a health care provider for individual recommendations. If you have any sort of medical condition, be sure to consult your health care provider before starting a new exercise program. What are some activities that can help me to lose weight?  Walking at a rate of at least 4.5 miles an hour.  Jogging or running at a rate of 5 miles per hour.  Biking at a rate of at least 10 miles per hour.  Lap swimming.  Roller-skating or in-line skating.  Cross-country skiing.  Vigorous competitive sports, such as football, basketball, and soccer.  Jumping rope.  Aerobic dancing. How can I be more active in my day-to-day  activities?  Use the stairs instead of the elevator.  Take a walk during your lunch break.  If you drive, park your car farther away from work or school.  If you take public transportation, get off one stop early and walk the rest of the way.  Make all of your phone calls while standing up and walking around.  Get up, stretch, and walk around every 30 minutes throughout the day. What guidelines should I follow while exercising?  Do not exercise so much that you hurt yourself, feel dizzy, or get very short of breath.  Consult your health care provider prior to starting a new exercise program.  Wear comfortable clothes and shoes with good support.  Drink plenty of water while you exercise to prevent dehydration or heat stroke. Body water is lost during exercise and must be replaced.  Work out until you breathe faster and your heart beats faster. This information is not intended to replace advice given to you by your health care provider. Make sure you discuss any questions you have with your health care provider. Document Released: 04/17/2010 Document Revised: 08/21/2015 Document Reviewed: 08/16/2013 [employer]vier Interactive Patient Education  2018 [employer]  

## 2017-07-29 NOTE — Progress Notes (Signed)
Subjective:    Patient ID: Robert Dawson, adult    DOB: [birth year], 42 y.o.   MRN: [id number] Robert Dawson "Robert Dawson, a 42 year old transgender MTF with a history of HIV, DMII, hypothyroidism, obesity, and intermittent asthma presents for a follow up of morbid obesity.  Patient is followed by bariatric clinic.  She is interested in undergoing bariatric surgery and is requiring assistance completing forms.  Patient says that she has learning disability and has difficulty understanding questions on the forearms.  She has been taking all prescribed medications consistently.  Patient says that she has been following a low fat diet.  She states that she is cut down on soda and sweet tea, but continues to drink 4 to 5 cups of juice per day.  She has not been exercising routinely. She does not check blood pressure at home. She continues to follow up with pulmonology consistently for sarcoidosis. . She denies chest pain, fever, fatigue, nausea, vomiting, or diarrhea.   Robert Dawson continues to complain of weight gain. Patient has had a 70 pound weight gain over the past 2 years. She has been referred to nutrition and diabetes management and has had diet plans without success. She has been unable to tolerate exercise due to sarcoidosis. She is requesting bariatric surgery.   Patient has a history of groin pain over the past several weeks. Patient endorses occasional unprotected sexual intercourse. He also reports a history of urinary retention. He has been taking flomax consistently. He denies urinary urgency, dysuria, incontinence, or low back pain. Patient was previously referred to urology for this problem. He says that no further interventions were prescribed.   Patient complains of obesity. Patient cites breathing difficulty and chronic conditions as reasons for wanting to lose weight.  Obesity History Weight in late teens: 120 lb. Period of greatest weight gain: 373 lb during 30's Lowest adult weight:  125 Highest adult weight: 373    History of Weight Loss Efforts Greatest amount of weight lost: 50 lb over  several months Amount of time that loss was maintained:  5 months Circumstances associated with regain of weight: Starting prednisone Successful weight loss techniques attempted: very low calorie diet   Current Exercise Habits none  Current Eating Habits Number of regular meals per day: 1 Number of snacking episodes per day: 0 Who shops for food? She shops for food.  Who prepares food? She prepares food Who eats with patient? Friends, brother and cousin Binge behavior?: Yes Purge behavior? No Eating precipitated by stress? Sometimes Guilt feelings associated with eating? Yes  Other Potential Contributing Factors Use of alcohol: average: No Use of medications that may cause weight gain corticosteroids (prednisone) History of past abuse? none Psych History: anxiety, tobacco use and body dysmorphic disorder Comorbidities: diabetes mellitus and dyslipidemias   HPI Past Medical History:  Diagnosis Date  . Allergy   . Depression   . Diabetes ([place]) 10/03/2015  . HIV disease ([place])   . Immune deficiency disorder [facility])    Social History   Socioeconomic History  . Marital status: Single    Spouse name: Not on file  . Number of children: Not on file  . Years of education: Not on file  . Highest education level: Not on file  Occupational History  . Not on file  Social Needs  . Financial resource strain: Not on file  . Food insecurity:    Worry: Not on file    Inability: Not on file  . Transportation needs:  Medical: Not on file    Non-medical: Not on file  Tobacco Use  . Smoking status: Never Smoker  . Smokeless tobacco: Never Used  Substance and Sexual Activity  . Alcohol use: No    Alcohol/week: 0.0 oz  . Drug use: No  . Sexual activity: Not Currently    Partners: Male, Male    Birth control/protection: Condom    Comment: given condoms. Pt is an under.   Lifestyle  . Physical activity:    Days per week: Not on file    Minutes per session: Not on file  . Stress: Not on file  Relationships  . Social connections:    Talks on phone: Not on file    Gets together: Not on file    Attends religious service: Not on file    Active member of club or organization: Not on file    Attends meetings of clubs or organizations: Not on file    Relationship status: Not on file  . Intimate partner violence:    Fear of current or ex partner: Not on file    Emotionally abused: Not on file    Physically abused: Not on file    Forced sexual activity: Not on file  Other Topics Concern  . Not on file  Social History Narrative  . Not on file   Allergies  Allergen Reactions  . Lisinopril Cough    Immunization History  Administered Date(s) Administered  . H1N1 05/09/2008  . Hepatitis B 04/22/2004, 08/04/2004, 10/27/2004  . Influenza Split 01/14/2011  . Influenza Whole 04/26/2006, 05/09/2008, 04/22/2009, 06/16/2010  . Influenza,inj,Quad PF,6+ Mos 12/12/2012, 01/31/2014, 12/30/2015, 02/24/2017  . Pneumococcal Polysaccharide-23 11/21/2003, 04/22/2009  . Tdap 01/31/2014   Review of Systems  Constitutional: Positive for unexpected weight change (weight gain). Negative for chills and fever.  HENT: Negative for ear pain and rhinorrhea.   Eyes: Negative.   Respiratory: Positive for cough. Negative for wheezing.   Cardiovascular: Negative for chest pain.  Gastrointestinal: Negative.   Endocrine: Negative for polydipsia, polyphagia and polyuria.  Genitourinary: Negative.   Musculoskeletal: Negative.   Skin: Negative.   Allergic/Immunologic: Positive for environmental allergies and immunocompromised state (HIV +).  Neurological: Negative.   Hematological: Negative.   Psychiatric/Behavioral: Negative.       Objective:   Physical Exam  Constitutional: She is oriented to person, place, and time. She appears well-developed and well-nourished.  Morbid  obesity  HENT:  Head: Normocephalic and atraumatic.  Right Ear: External ear normal.  Left Ear: External ear normal.  Nose: Nose normal.  Mouth/Throat: Oropharynx is clear and moist.  Eyes: Pupils are equal, round, and reactive to light. Conjunctivae and EOM are normal.  Neck: Normal range of motion.  Cardiovascular: Normal rate, regular rhythm, normal heart sounds and intact distal pulses.  Pulmonary/Chest: Effort normal. She has no decreased breath sounds. She has no wheezes. She has no rhonchi. She has no rales.  Abdominal: Soft. Bowel sounds are normal.  Increased abdominal girth  Musculoskeletal: Normal range of motion.  Neurological: She is alert and oriented to person, place, and time. She has normal reflexes.  Skin: Skin is warm and dry.  Psychiatric: She has a normal mood and affect. Her behavior is normal. Judgment and thought content normal.     BP (!) 139/92 (BP Location: Left Arm, Patient Position: Sitting, Cuff Size: Large)   Pulse 92   Temp 98.8 F (37.1 C) (Oral)   Resp 16   Ht 5\' 9"  (1.753 m)  Wt (!) 313 lb (142 kg)   SpO2 97%   BMI 46.22 kg/m  Assessment & Plan:   Morbid obesity ([place]) Patient has had a 70 pound weight gain over the past 2 years. She is requesting bariatric surgery. I previously sent a referral to bariatric surgery. She has been to nutritionist and has attempted to make dietary changes.   She is to continue to follow with diabetes and nutrition. Recommend a lowfat, low carbohydrate diet divided over 5-6 small meals, increase water intake to 6-8 glasses, and increase daily activity.  Also, a previous referral was sent to psychiatry for history of body dysmorphic disorder.  Robert Dawson states that she has followed up with psychiatry.   Assisted patient is in completing forms due to learning disability.   RTC: Follow-up in clinic as previously scheduled for chronic conditions  Donia Pounds  MSN, FNP-C [facility] [address] [place], [place] [address] [phone]

## 2017-08-04 ENCOUNTER — Other Ambulatory Visit: Payer: Self-pay | Admitting: Family Medicine

## 2017-08-04 ENCOUNTER — Other Ambulatory Visit: Payer: Self-pay | Admitting: Internal Medicine

## 2017-08-04 DIAGNOSIS — J4520 Mild intermittent asthma, uncomplicated: Secondary | ICD-10-CM

## 2017-08-11 ENCOUNTER — Ambulatory Visit: Payer: Self-pay | Admitting: Internal Medicine

## 2017-08-30 ENCOUNTER — Other Ambulatory Visit: Payer: Medicare Other

## 2017-08-30 DIAGNOSIS — B20 Human immunodeficiency virus [HIV] disease: Secondary | ICD-10-CM

## 2017-08-31 LAB — COMPREHENSIVE METABOLIC PANEL
AG Ratio: 1.1 (calc) (ref 1.0–2.5)
ALKALINE PHOSPHATASE (APISO): 101 U/L (ref 40–115)
ALT: 14 U/L (ref 9–46)
AST: 33 U/L (ref 10–40)
Albumin: 3.7 g/dL (ref 3.6–5.1)
BILIRUBIN TOTAL: 0.5 mg/dL (ref 0.2–1.2)
BUN: 9 mg/dL (ref 7–25)
CALCIUM: 9.4 mg/dL (ref 8.6–10.3)
CO2: 26 mmol/L (ref 20–32)
CREATININE: 1.22 mg/dL (ref 0.60–1.35)
Chloride: 104 mmol/L (ref 98–110)
Globulin: 3.5 g/dL (calc) (ref 1.9–3.7)
Glucose, Bld: 114 mg/dL — ABNORMAL HIGH (ref 65–99)
Potassium: 4.5 mmol/L (ref 3.5–5.3)
Sodium: 138 mmol/L (ref 135–146)
TOTAL PROTEIN: 7.2 g/dL (ref 6.1–8.1)

## 2017-08-31 LAB — LIPID PANEL
CHOL/HDL RATIO: 4.7 (calc) (ref <5.0)
CHOLESTEROL: 154 mg/dL (ref <200)
HDL: 33 mg/dL — AB (ref >40)
LDL CHOLESTEROL (CALC): 100 mg/dL — AB
NON-HDL CHOLESTEROL (CALC): 121 mg/dL (ref <130)
Triglycerides: 115 mg/dL (ref <150)

## 2017-08-31 LAB — CBC
HEMATOCRIT: 41 % (ref 38.5–50.0)
HEMOGLOBIN: 14.3 g/dL (ref 13.2–17.1)
MCH: 30 pg (ref 27.0–33.0)
MCHC: 34.9 g/dL (ref 32.0–36.0)
MCV: 86 fL (ref 80.0–100.0)
MPV: 11.2 fL (ref 7.5–12.5)
Platelets: 282 10*3/uL (ref 140–400)
RBC: 4.77 10*6/uL (ref 4.20–5.80)
RDW: 14.4 % (ref 11.0–15.0)
WBC: 7.6 10*3/uL (ref 3.8–10.8)

## 2017-08-31 LAB — T-HELPER CELL (CD4) - (RCID CLINIC ONLY)
CD4 % Helper T Cell: 26 % — ABNORMAL LOW (ref 33–55)
CD4 T Cell Abs: 630 /uL (ref 400–2700)

## 2017-08-31 LAB — RPR TITER: RPR Titer: 1:4 {titer} — ABNORMAL HIGH

## 2017-08-31 LAB — RPR: RPR: REACTIVE — AB

## 2017-08-31 LAB — FLUORESCENT TREPONEMAL AB(FTA)-IGG-BLD: Fluorescent Treponemal ABS: REACTIVE — AB

## 2017-09-01 LAB — HIV-1 RNA QUANT-NO REFLEX-BLD
HIV 1 RNA QUANT: NOT DETECTED {copies}/mL
HIV-1 RNA QUANT, LOG: NOT DETECTED {Log_copies}/mL

## 2017-09-03 ENCOUNTER — Other Ambulatory Visit: Payer: Self-pay | Admitting: Internal Medicine

## 2017-09-03 ENCOUNTER — Other Ambulatory Visit: Payer: Self-pay | Admitting: Family Medicine

## 2017-09-03 DIAGNOSIS — B20 Human immunodeficiency virus [HIV] disease: Secondary | ICD-10-CM

## 2017-09-03 DIAGNOSIS — J4520 Mild intermittent asthma, uncomplicated: Secondary | ICD-10-CM

## 2017-09-04 ENCOUNTER — Other Ambulatory Visit: Payer: Self-pay | Admitting: Internal Medicine

## 2017-09-04 ENCOUNTER — Other Ambulatory Visit: Payer: Self-pay | Admitting: Family Medicine

## 2017-09-04 DIAGNOSIS — J4520 Mild intermittent asthma, uncomplicated: Secondary | ICD-10-CM

## 2017-09-05 ENCOUNTER — Other Ambulatory Visit: Payer: Self-pay | Admitting: *Deleted

## 2017-09-05 DIAGNOSIS — B20 Human immunodeficiency virus [HIV] disease: Secondary | ICD-10-CM

## 2017-09-05 MED ORDER — EMTRICITABINE-TENOFOVIR AF 200-25 MG PO TABS
1.0000 | ORAL_TABLET | Freq: Every day | ORAL | 5 refills | Status: DC
Start: 2017-09-05 — End: 2017-09-13

## 2017-09-05 MED ORDER — DOLUTEGRAVIR SODIUM 50 MG PO TABS: 50.0000 mg | ORAL_TABLET | Freq: Every day | ORAL | 5 refills | Status: DC

## 2017-09-09 ENCOUNTER — Ambulatory Visit: Payer: Medicare Other | Admitting: Family Medicine

## 2017-09-13 ENCOUNTER — Encounter: Payer: Self-pay | Admitting: Internal Medicine

## 2017-09-13 ENCOUNTER — Ambulatory Visit (INDEPENDENT_AMBULATORY_CARE_PROVIDER_SITE_OTHER): Payer: Medicare Other | Admitting: Internal Medicine

## 2017-09-13 DIAGNOSIS — B20 Human immunodeficiency virus [HIV] disease: Secondary | ICD-10-CM | POA: Diagnosis not present

## 2017-09-13 DIAGNOSIS — A528 Late syphilis, latent: Secondary | ICD-10-CM

## 2017-09-13 DIAGNOSIS — D860 Sarcoidosis of lung: Secondary | ICD-10-CM

## 2017-09-13 MED ORDER — BICTEGRAVIR-EMTRICITAB-TENOFOV 50-200-25 MG PO TABS: 1.0000 | ORAL_TABLET | Freq: Every day | ORAL | 5 refills | Status: DC

## 2017-09-13 NOTE — Progress Notes (Signed)
Patient Active Problem List   Diagnosis Date Noted  . Sarcoidosis of lung ([place]) 01/31/2014    Priority: High  . Human immunodeficiency virus (HIV) disease ([place]) 04/26/2006    Priority: High  . Depression 04/26/2006    Priority: Medium  . Hip pain, bilateral 04/27/2016  . Urine leukocytes 04/27/2016  . Weight gain 04/27/2016  . Urinary hesitancy 04/27/2016  . Liver mass 04/05/2016  . Abdominal pain 03/09/2016  . 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 GERD (gastroesophageal reflux disease) 08/15/2014  . Pulmonary infiltrates 07/25/2014  . Bronchitis, chronic obstructive ([place]) 07/16/2014  . SOB (shortness of breath) 02/04/2014  . Syncope 02/04/2014  . Immunization due 01/31/2014  . Tachycardia 01/31/2014  . Generalized swelling, mass, or lump of abdomen or pelvis 09/21/2013  . Other allergic rhinitis 09/21/2013  . Essential hypertension 09/21/2013  . Preventative health care 09/21/2013  . Wheezing 08/07/2013  . Peripheral neuropathy 08/31/2010  . MOLE 10/14/2009  . SKIN RASH 10/14/2009  . MORBID OBESITY 04/22/2009  . 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 ALLERGIC RHINITIS 04/26/2006  . Hilar adenopathy 04/26/2006    Patient's Medications  New Prescriptions   BICTEGRAVIR-EMTRICITABINE-TENOFOVIR AF (BIKTARVY) 50-200-25 MG TABS TABLET    Take 1 tablet by mouth daily.  Previous Medications   AMLODIPINE (NORVASC) 5 MG TABLET    Take 1 tablet (5 mg total) by mouth daily.   BECLOMETHASONE DIPROP HFA (QVAR REDIHALER) 40 MCG/ACT AERB    Inhale 1 puff into the lungs daily.   BLOOD GLUCOSE METER KIT AND SUPPLIES KIT    Dispense based on patient and insurance preference. Use up to four times daily as directed. (FOR ICD-9 250.00, 250.01).   LEVOTHYROXINE (SYNTHROID, LEVOTHROID) 100 MCG TABLET    TAKE 1 TABLET(100 MCG) BY MOUTH DAILY BEFORE BREAKFAST   METFORMIN (GLUCOPHAGE) 1000 MG TABLET    TAKE 1 TABLET  BY MOUTH TWICE DAILY WITH MEALS   METFORMIN (GLUCOPHAGE) 500 MG TABLET    Take 1 tablet (500 mg total) by mouth daily with breakfast.   MONTELUKAST (SINGULAIR) 10 MG TABLET    TAKE 1 TABLET(10 MG) BY MOUTH AT BEDTIME   MONTELUKAST (SINGULAIR) 10 MG TABLET    TAKE 1 TABLET(10 MG) BY MOUTH AT BEDTIME   NAPROXEN (NAPROSYN) 500 MG TABLET    TAKE 1 TABLET(500 MG) BY MOUTH TWICE DAILY WITH A MEAL   NEBULIZERS (COMPRESSOR NEBULIZER) MISC    1 Device by Does not apply route every 6 (six) hours as needed.   PREDNISONE (DELTASONE) 10 MG TABLET    TAKE 1 TABLET(10 MG) BY MOUTH DAILY WITH BREAKFAST   TAMSULOSIN (FLOMAX) 0.4 MG CAPS CAPSULE    Take 1 capsule by mouth daily.   VENTOLIN HFA 108 (90 BASE) MCG/ACT INHALER    INHALE 2 PUFFS INTO THE LUNGS EVERY 6 HOURS AS NEEDED WHEEZING OR SHORTNESS OF BREATH  Modified Medications   No medications on file  Discontinued Medications   DOLUTEGRAVIR (TIVICAY) 50 MG TABLET    Take 1 tablet (50 mg total) by mouth daily.   EMTRICITABINE-TENOFOVIR AF (DESCOVY) 200-25 MG TABLET    Take 1 tablet by mouth daily.    Subjective: Robert Dawson is in for her routine HIV follow-up visit.  She tells me that her pharmacy has been sending her Descovy and Tivicay at different times each month although she calls for refills of both at the same  time.  This is causing her to run short of 1 several times recently.  When this happens she takes the one that she has.  She is working with the bariatric clinic to try to lose weight.  She is hoping to have gastric sleeve surgery.  She is planning on moving back to her hometown of [place], [place] later this week.  She wants to continue receiving her care here.  Review of Systems: Review of Systems  Constitutional: Negative for chills, diaphoresis, fever, malaise/fatigue and weight loss.  HENT: Negative for sore throat.   Respiratory: Negative for cough, sputum production and shortness of breath.   Cardiovascular: Negative for chest pain.   Gastrointestinal: Negative for abdominal pain, diarrhea, heartburn, nausea and vomiting.  Genitourinary: Negative for dysuria and frequency.  Musculoskeletal: Negative for joint pain and myalgias.  Skin: Negative for rash.  Neurological: Negative for dizziness and headaches.  Psychiatric/Behavioral: Positive for depression. Negative for substance abuse. The patient is not nervous/anxious.     Past Medical History:  Diagnosis Date  . Allergy   . Depression   . Diabetes ([place]) 10/03/2015  . HIV disease ([place])   . Immune deficiency disorder [facility])     Social History   Tobacco Use  . Smoking status: Never Smoker  . Smokeless tobacco: Never Used  Substance Use Topics  . Alcohol use: No    Alcohol/week: 0.0 oz  . Drug use: No    Family History  Problem Relation Age of Onset  . Diabetes Maternal Grandmother   . Hypertension Maternal Grandmother     Allergies  Allergen Reactions  . Lisinopril Cough    Health Maintenance  Topic Date Due  . PAP SMEAR  04/20/1995  . OPHTHALMOLOGY EXAM  03/17/2017  . FOOT EXAM  04/27/2017  . URINE MICROALBUMIN  07/27/2017  . HEMOGLOBIN A1C  07/28/2017  . TETANUS/TDAP  02/01/2024  . PNEUMOCOCCAL POLYSACCHARIDE VACCINE  Completed  . HIV Screening  Completed    Objective:  Vitals:   09/13/17 1109  BP: 125/87  Pulse: 85  Temp: 98.1 F (36.7 C)  TempSrc: Oral  Weight: (!) 317 lb (143.8 kg)   Body mass index is 46.81 kg/m.  Physical Exam  Constitutional: She is oriented to person, place, and time.  She is in good spirits.  Her weight is down 16 pounds over the past year.  HENT:  Mouth/Throat: No oropharyngeal exudate.  Eyes: Conjunctivae are normal.  Cardiovascular: Normal rate, regular rhythm and normal heart sounds.  No murmur heard. Pulmonary/Chest: Effort normal and breath sounds normal.  Abdominal: Soft. She exhibits no mass. There is no tenderness.  Musculoskeletal: Normal range of motion.  Neurological: She is alert and  oriented to person, place, and time.  Skin: No rash noted.    Lab Results Lab Results  Component Value Date   WBC 7.6 08/30/2017   HGB 14.3 08/30/2017   HCT 41.0 08/30/2017   MCV 86.0 08/30/2017   PLT 282 08/30/2017    Lab Results  Component Value Date   CREATININE 1.22 08/30/2017   BUN 9 08/30/2017   NA 138 08/30/2017   K 4.5 08/30/2017   CL 104 08/30/2017   CO2 26 08/30/2017    Lab Results  Component Value Date   ALT 14 08/30/2017   AST 33 08/30/2017   ALKPHOS 78 04/01/2016   BILITOT 0.5 08/30/2017    Lab Results  Component Value Date   CHOL 154 08/30/2017   HDL 33 (L) 08/30/2017  LDLCALC 100 (H) 08/30/2017   TRIG 115 08/30/2017   CHOLHDL 4.7 08/30/2017   Lab Results  Component Value Date   LABRPR REACTIVE (A) 08/30/2017   RPRTITER 1:4 (H) 08/30/2017   HIV 1 RNA Quant (copies/mL)  Date Value  08/30/2017 <20 NOT DETECTED  03/09/2017 28 (H)  08/26/2016 <20 DETECTED (A)   CD4 T Cell Abs (/uL)  Date Value  08/30/2017 630  03/09/2017 600  08/26/2016 510     Problem List Items Addressed This Visit      High   Human immunodeficiency virus (HIV) disease ([place])    Her infection remains under excellent long-term control.  In order to lessen the risk that she will not have her complete antiretroviral regimen we will consolidate her to drug regimen to [facility].  She will follow-up here in 6 months.      Relevant Medications   bictegravir-emtricitabine-tenofovir AF (BIKTARVY) 50-200-25 MG TABS tablet   Sarcoidosis of lung ([facility])    It appears that her sarcoidosis is under good control on 10 mg of prednisone daily.        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      Relevant Medications   bictegravir-emtricitabine-tenofovir AF (BIKTARVY) 50-200-25 MG TABS tablet        Michel Bickers, MD [facility] for Infectious [place] [phone] pager   [phone] cell 09/13/2017, 12:11 PM

## 2017-09-13 NOTE — Progress Notes (Signed)
HPI: Robert Dawson is a 42 y.o. adult who presents to the [facility] clinic today to follow-up with [name] for her HIV infection.  Patient Active Problem List   Diagnosis Date Noted  . Hip pain, bilateral 04/27/2016  . Urine leukocytes 04/27/2016  . Weight gain 04/27/2016  . Urinary hesitancy 04/27/2016  . Liver mass 04/05/2016  . Abdominal pain 03/09/2016  . 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 GERD (gastroesophageal reflux disease) 08/15/2014  . Pulmonary infiltrates 07/25/2014  . Bronchitis, chronic obstructive ([place]) 07/16/2014  . SOB (shortness of breath) 02/04/2014  . Syncope 02/04/2014  . Sarcoidosis of lung ([place]) 01/31/2014  . Immunization due 01/31/2014  . Tachycardia 01/31/2014  . Generalized swelling, mass, or lump of abdomen or pelvis 09/21/2013  . Other allergic rhinitis 09/21/2013  . Essential hypertension 09/21/2013  . Preventative health care 09/21/2013  . Wheezing 08/07/2013  . Peripheral neuropathy 08/31/2010  . MOLE 10/14/2009  . SKIN RASH 10/14/2009  . MORBID OBESITY 04/22/2009  . 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 Human immunodeficiency virus (HIV) disease ([place]) 04/26/2006  . 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 Depression 04/26/2006  . ALLERGIC RHINITIS 04/26/2006  . Hilar adenopathy 04/26/2006    Patient's Medications  New Prescriptions   BICTEGRAVIR-EMTRICITABINE-TENOFOVIR AF (BIKTARVY) 50-200-25 MG TABS TABLET    Take 1 tablet by mouth daily.  Previous Medications   AMLODIPINE (NORVASC) 5 MG TABLET    Take 1 tablet (5 mg total) by mouth daily.   BECLOMETHASONE DIPROP HFA (QVAR REDIHALER) 40 MCG/ACT AERB    Inhale 1 puff into the lungs daily.   BLOOD GLUCOSE METER KIT AND SUPPLIES KIT    Dispense based on patient and insurance preference. Use up to four times daily as directed. (FOR ICD-9 250.00, 250.01).   LEVOTHYROXINE (SYNTHROID, LEVOTHROID) 100 MCG TABLET    TAKE 1 TABLET(100 MCG) BY MOUTH DAILY BEFORE  BREAKFAST   METFORMIN (GLUCOPHAGE) 1000 MG TABLET    TAKE 1 TABLET BY MOUTH TWICE DAILY WITH MEALS   METFORMIN (GLUCOPHAGE) 500 MG TABLET    Take 1 tablet (500 mg total) by mouth daily with breakfast.   MONTELUKAST (SINGULAIR) 10 MG TABLET    TAKE 1 TABLET(10 MG) BY MOUTH AT BEDTIME   MONTELUKAST (SINGULAIR) 10 MG TABLET    TAKE 1 TABLET(10 MG) BY MOUTH AT BEDTIME   NAPROXEN (NAPROSYN) 500 MG TABLET    TAKE 1 TABLET(500 MG) BY MOUTH TWICE DAILY WITH A MEAL   NEBULIZERS (COMPRESSOR NEBULIZER) MISC    1 Device by Does not apply route every 6 (six) hours as needed.   PREDNISONE (DELTASONE) 10 MG TABLET    TAKE 1 TABLET(10 MG) BY MOUTH DAILY WITH BREAKFAST   TAMSULOSIN (FLOMAX) 0.4 MG CAPS CAPSULE    Take 1 capsule by mouth daily.   VENTOLIN HFA 108 (90 BASE) MCG/ACT INHALER    INHALE 2 PUFFS INTO THE LUNGS EVERY 6 HOURS AS NEEDED WHEEZING OR SHORTNESS OF BREATH  Modified Medications   No medications on file  Discontinued Medications   DOLUTEGRAVIR (TIVICAY) 50 MG TABLET    Take 1 tablet (50 mg total) by mouth daily.   EMTRICITABINE-TENOFOVIR AF (DESCOVY) 200-25 MG TABLET    Take 1 tablet by mouth daily.    Allergies: Allergies  Allergen Reactions  . Lisinopril Cough    Past Medical History: Past Medical History:  Diagnosis Date  . Allergy   . Depression   . Diabetes ([place]) 10/03/2015  .  HIV disease ([place])   . Immune deficiency disorder [facility])     Social History: Social History   Socioeconomic History  . Marital status: Single    Spouse name: Not on file  . Number of children: Not on file  . Years of education: Not on file  . Highest education level: Not on file  Occupational History  . Not on file  Social Needs  . Financial resource strain: Not on file  . Food insecurity:    Worry: Not on file    Inability: Not on file  . Transportation needs:    Medical: Not on file    Non-medical: Not on file  Tobacco Use  . Smoking status: Never Smoker  . Smokeless tobacco: Never Used   Substance and Sexual Activity  . Alcohol use: No    Alcohol/week: 0.0 oz  . Drug use: No  . Sexual activity: Not Currently    Partners: Male, Male    Birth control/protection: Condom    Comment: given condoms. Pt is an under.  Lifestyle  . Physical activity:    Days per week: Not on file    Minutes per session: Not on file  . Stress: Not on file  Relationships  . Social connections:    Talks on phone: Not on file    Gets together: Not on file    Attends religious service: Not on file    Active member of club or organization: Not on file    Attends meetings of clubs or organizations: Not on file    Relationship status: Not on file  Other Topics Concern  . Not on file  Social History Narrative  . Not on file    Labs: Lab Results  Component Value Date   HIV1RNAQUANT <20 NOT DETECTED 08/30/2017   HIV1RNAQUANT 28 (H) 03/09/2017   HIV1RNAQUANT <20 DETECTED (A) 08/26/2016   CD4TABS 630 08/30/2017   CD4TABS 600 03/09/2017   CD4TABS 510 08/26/2016    RPR and STI Lab Results  Component Value Date   LABRPR REACTIVE (A) 08/30/2017   LABRPR REACTIVE (A) 03/09/2017   LABRPR REACTIVE (A) 08/26/2016   LABRPR REACTIVE (A) 04/01/2016   LABRPR REACTIVE (A) 02/24/2016   RPRTITER 1:4 (H) 08/30/2017   RPRTITER 1:8 (H) 03/09/2017   RPRTITER 1:32 (A) 08/26/2016   RPRTITER 1:8 04/01/2016   RPRTITER 1:8 02/24/2016    STI Results GC CT  Latest Ref Rng & Units - Negative  06/24/2017 - Negative  08/26/2016 Negative Negative  02/24/2016 Negative Negative  09/04/2015 Negative Negative  12/25/2011 - NEGATIVE    Hepatitis B Lab Results  Component Value Date   HEPBSAB POS (A) 11/07/2007   HEPBSAG NO 05/23/2006   Hepatitis C No results found for: HEPCAB, HCVRNAPCRQN Hepatitis A No results found for: HAV Lipids: Lab Results  Component Value Date   CHOL 154 08/30/2017   TRIG 115 08/30/2017   HDL 33 (L) 08/30/2017   CHOLHDL 4.7 08/30/2017   VLDL 24 10/20/2015   LDLCALC 100  (H) 08/30/2017    Current HIV Regimen: Tivicay + Descovy  Assessment: Robert Dawson is here today to follow-up with [name] for her HIV infection.  She is currently taking Tivicay and Descovy but has some concerns and complaints with the [facility] she is currently getting them filled through.  She states that sometimes she has one without the other for almost a week at a time and has sometimes taken Tivicay alone with no Descovy because of this.  I  counseled her on the importance of never taking one without the other, but to help with this, we will change her to [facility].  I counseled her on how to take [place] including one pill once daily with or without food. Told her to pick that up and stop taking the Tivicay and Descovy once she picked it up. She will be moving soon but states she has a [facility] in [facility] that she will get the Rx transferred to. Will send in Rx for her.  I gave her a co-pay card and told her to call me with any issues.  Plan: - Stop Tivicay and Descovy - Start Biktarvy PO once daily  Ltanya Bayley L. Seamus Warehime, PharmD, [place], [place] for Infectious Disease 09/13/2017, 11:43 AM

## 2017-09-13 NOTE — Assessment & Plan Note (Signed)
She has been treated for late latent syphilis and remains serofast.

## 2017-09-13 NOTE — Assessment & Plan Note (Signed)
Her infection remains under excellent long-term control.  In order to lessen the risk that she will not have her complete antiretroviral regimen we will consolidate her to drug regimen to [facility].  She will follow-up here in 6 months.

## 2017-09-13 NOTE — Assessment & Plan Note (Signed)
It appears that her sarcoidosis is under good control on 10 mg of prednisone daily.

## 2017-09-30 ENCOUNTER — Other Ambulatory Visit: Payer: Self-pay | Admitting: Family Medicine

## 2017-09-30 DIAGNOSIS — R1032 Left lower quadrant pain: Secondary | ICD-10-CM

## 2017-10-03 ENCOUNTER — Other Ambulatory Visit: Payer: Self-pay | Admitting: Family Medicine

## 2017-10-03 ENCOUNTER — Other Ambulatory Visit: Payer: Self-pay | Admitting: Internal Medicine

## 2017-10-03 ENCOUNTER — Other Ambulatory Visit: Payer: Self-pay

## 2017-10-03 DIAGNOSIS — J4520 Mild intermittent asthma, uncomplicated: Secondary | ICD-10-CM

## 2017-10-03 DIAGNOSIS — E039 Hypothyroidism, unspecified: Secondary | ICD-10-CM

## 2017-10-03 MED ORDER — MONTELUKAST SODIUM 10 MG PO TABS: ORAL_TABLET | ORAL | 0 refills | Status: DC

## 2017-11-14 ENCOUNTER — Ambulatory Visit: Payer: Self-pay | Admitting: Internal Medicine

## 2017-11-29 IMAGING — MR MR ABDOMEN WO/W CM
16 series · 48 of 48 positions shown · IV contrast (10 EOVIST)
Comparison: Hepatic MRI 04/20/2016.

CLINICAL DATA: Patient with indeterminate liver mass on prior MRI.
Follow-up evaluation.

EXAM:
MRI ABDOMEN WITHOUT AND WITH CONTRAST
TECHNIQUE: Multiplanar multisequence MR imaging of the abdomen was performed
both before and after the administration of intravenous contrast.
CONTRAST:  10mL EOVIST GADOXETATE DISODIUM 0.25 MOL/L IV SOLN

[Series 6: T2 · coronal · 5.0mm · 1.76mm/px · 2 of 43 slices shown (1 of 3)]
[im 1/43]
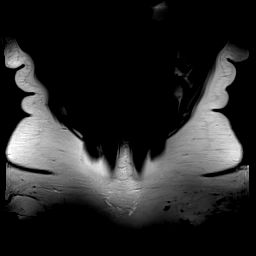
[im 43/43]
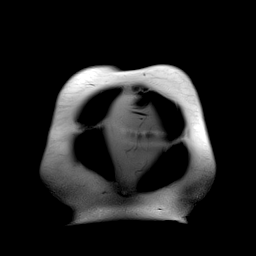

[Series 7: T1 · axial · 3.0mm · 1.31mm/px · z∈[-95,+165]mm · 6 of 176 slices shown]
[im 1/176]
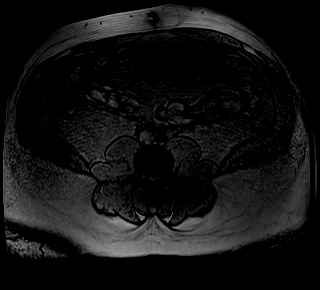
[im 36/176]
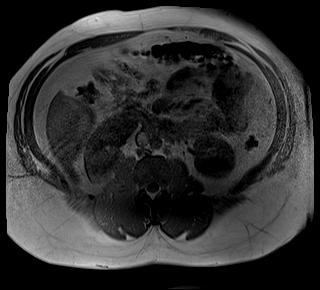
[im 71/176]
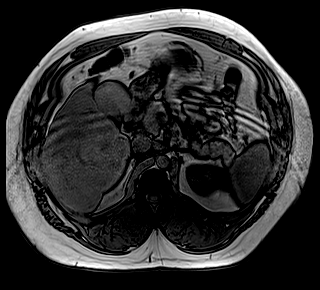
[im 106/176]
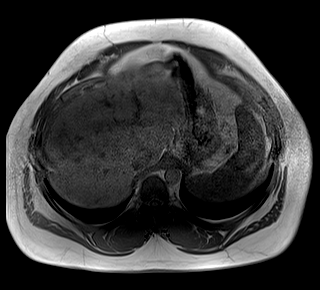
[im 141/176]
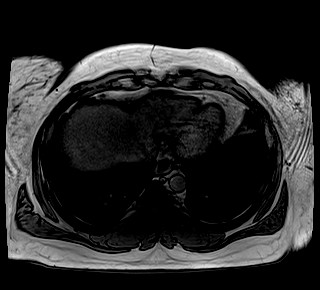
[im 176/176]
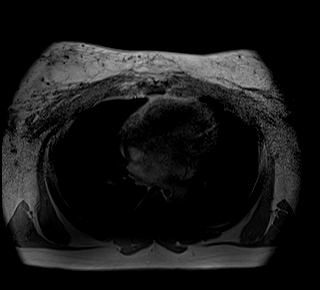

[Series 8: T1 dynamic · axial · non-contrast · 3.0mm · 1.31mm/px · z∈[-95,+165]mm · 3 of 88 slices shown]
[im 1/88]
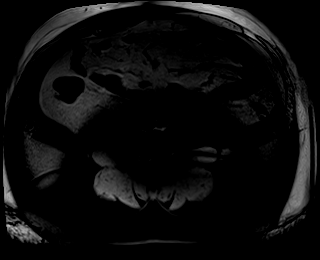
[im 44/88]
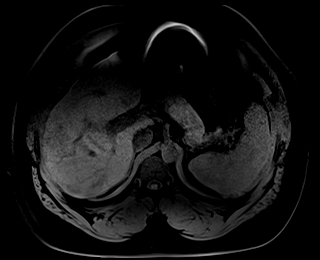
[im 88/88]
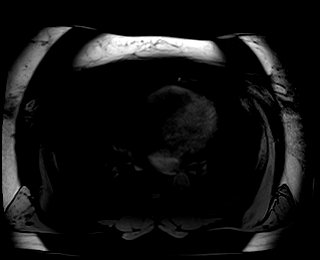

[Series 9: T1 dynamic post-contrast · axial · 3.0mm · 1.31mm/px · z∈[-95,+165]mm · 3 of 88 slices shown (1 of 9)]
[im 1/88]
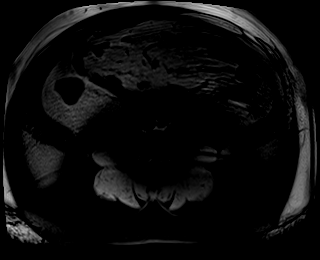
[im 44/88]
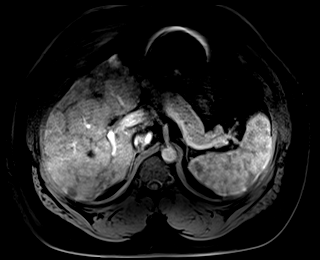
[im 88/88]
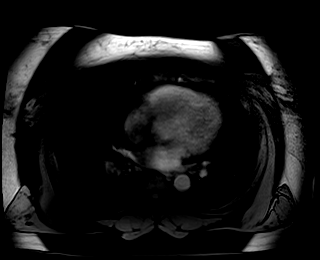

[Series 10: T1 dynamic post-contrast · axial · 3.0mm · 1.31mm/px · z∈[-95,+165]mm · 3 of 88 slices shown (2 of 9)]
[im 1/88]
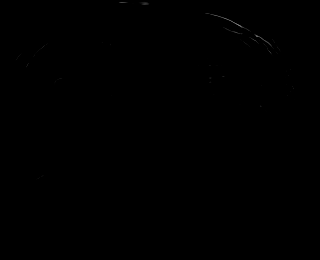
[im 44/88]
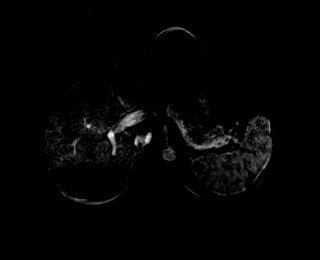
[im 88/88]
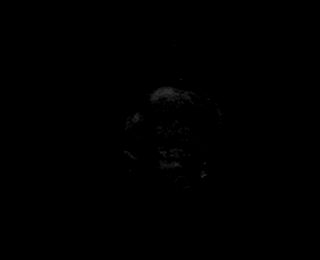

[Series 11: T1 dynamic post-contrast · axial · 3.0mm · 1.31mm/px · z∈[-95,+165]mm · 3 of 88 slices shown (3 of 9)]
[im 1/88]
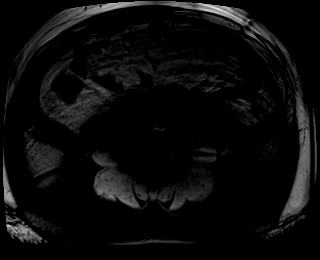
[im 44/88]
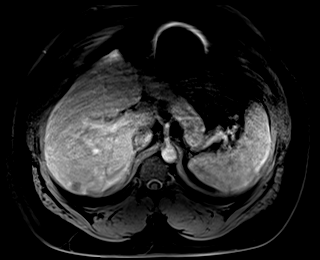
[im 88/88]
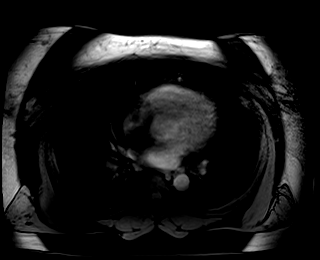

[Series 12: T1 dynamic post-contrast · axial · 3.0mm · 1.31mm/px · z∈[-95,+165]mm · 3 of 88 slices shown (4 of 9)]
[im 1/88]
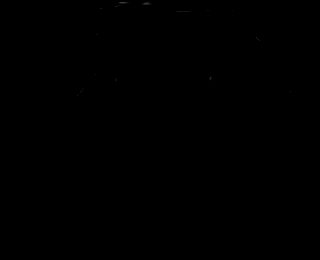
[im 44/88]
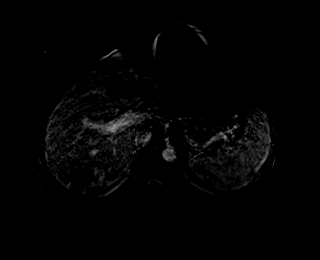
[im 88/88]
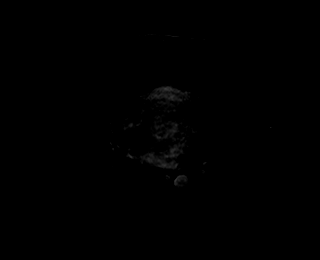

[Series 13: T1 dynamic post-contrast · axial · 3.0mm · 1.31mm/px · z∈[-95,+165]mm · 3 of 88 slices shown (5 of 9)]
[im 1/88]
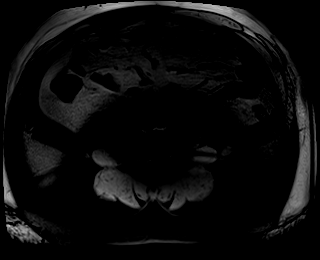
[im 44/88]
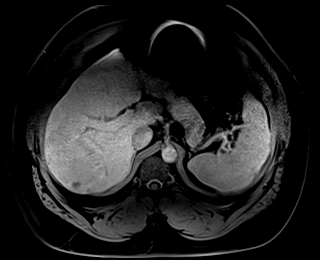
[im 88/88]
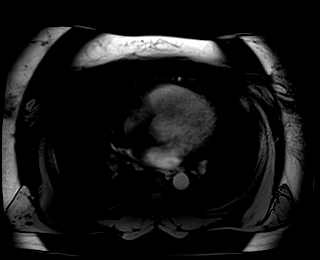

[Series 14: T1 dynamic post-contrast · axial · 3.0mm · 1.31mm/px · z∈[-95,+165]mm · 3 of 88 slices shown (6 of 9)]
[im 1/88]
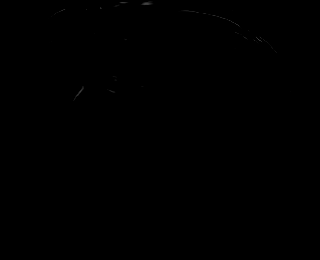
[im 44/88]
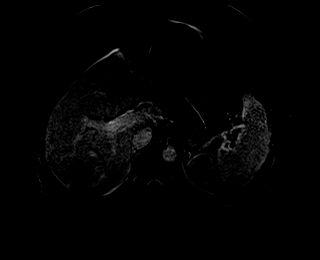
[im 88/88]
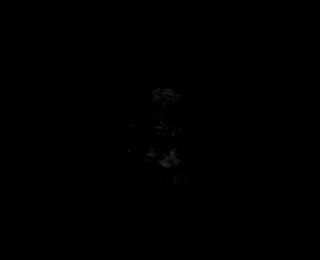

[Series 15: T1 dynamic post-contrast · coronal · 3.0mm · 1.41mm/px · 3 of 88 slices shown (7 of 9)]
[im 1/88]
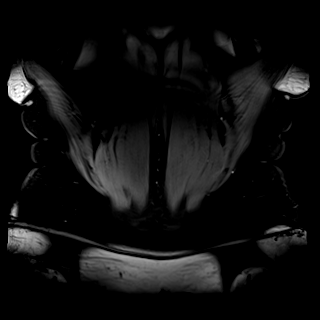
[im 44/88]
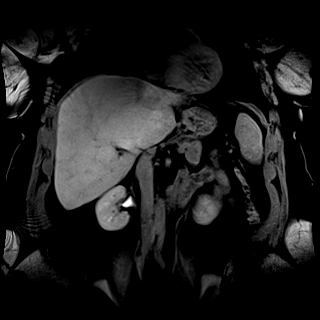
[im 88/88]
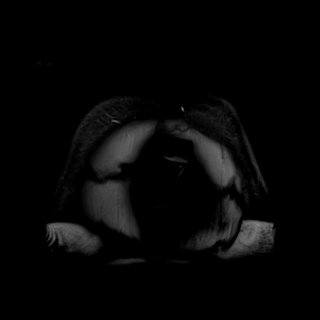

[Series 16: T2 · axial · 5.0mm · 1.76mm/px · z∈[-93,+171]mm · 2 of 45 slices shown (2 of 3)]
[im 1/45]
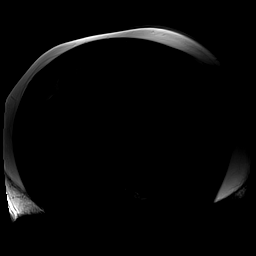
[im 45/45]
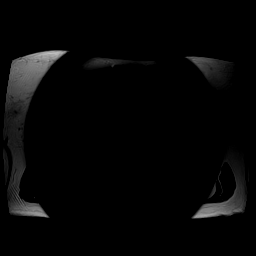

[Series 17: DWI · axial · 5.0mm · 1.68mm/px · z∈[-103,+173]mm · 5 of 141 slices shown (1 of 2)]
[im 1/141]
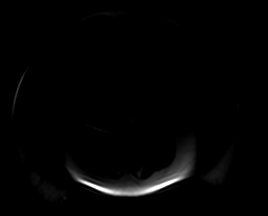
[im 36/141]
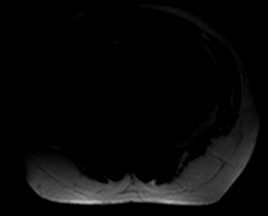
[im 71/141]
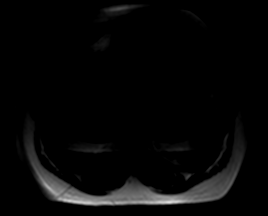
[im 106/141]
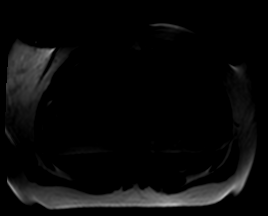
[im 141/141]
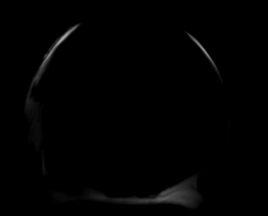

[Series 18: DWI · axial · 5.0mm · 1.68mm/px · z∈[-103,+173]mm · 2 of 47 slices shown (2 of 2)]
[im 1/47]
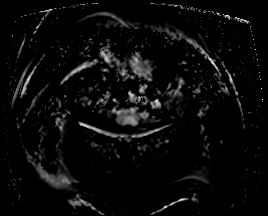
[im 47/47]
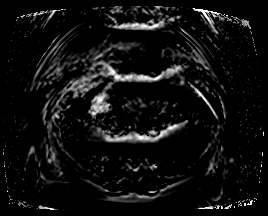

[Series 19: T2 · axial · 6.0mm · 1.41mm/px · 1 of 37 slices shown (3 of 3)]
[im 1/37]
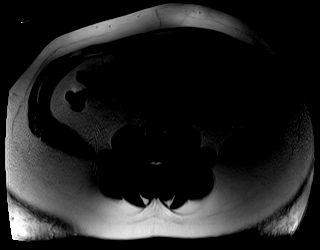

[Series 20: T1 dynamic post-contrast · axial · 3.0mm · 1.31mm/px · z∈[-95,+165]mm · 3 of 88 slices shown (8 of 9)]
[im 1/88]
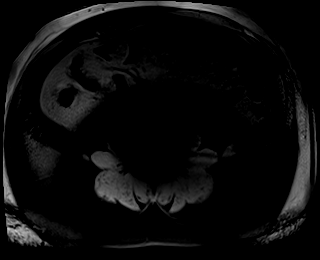
[im 44/88]
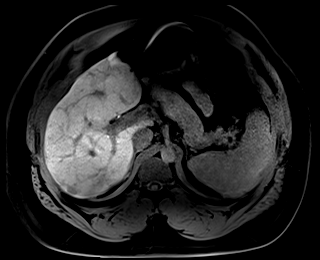
[im 88/88]
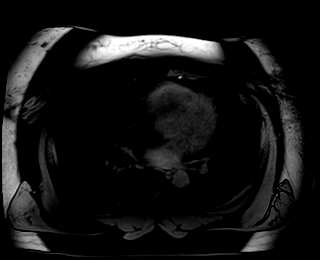

[Series 21: T1 dynamic post-contrast · axial · 3.0mm · 1.31mm/px · z∈[-95,+165]mm · 3 of 88 slices shown (9 of 9)]
[im 1/88]
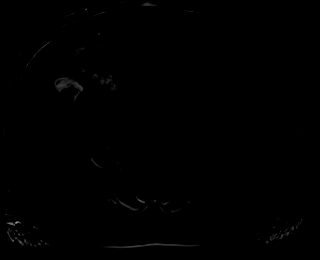
[im 44/88]
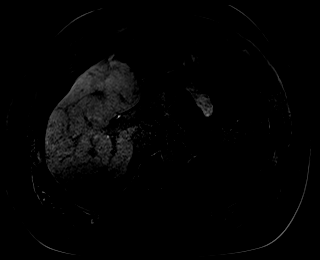
[im 88/88]
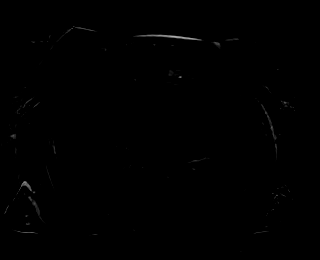

[48 of 48 positions shown; findings below may reference images not displayed]

FINDINGS: Lower chest: Lung bases are clear.  Normal heart size.

Hepatobiliary: Liver is mildly nodular in contour. Bandlike areas of
increased T2 attenuation throughout the liver, predominately
involving the hepatic dome, suggestive of fibrosis. Re- demonstrated
1.6 cm hemangioma right hepatic lobe (image 47; series 11).
Unchanged subcentimeter cyst or small hemangioma within the hepatic
dome (image 15; series 9). Within the anterior left hepatic lobe
adjacent to the falciform ligament is an unchanged 3.0 x 1.7 cm
lesion with mild peripheral contrast enhancement and no delayed
central enhancement. No signal intensity identified on T2 weighted
imaging.

Pancreas:  Unremarkable

Spleen:  Unremarkable

Adrenals/Urinary Tract: The adrenal glands are normal. No
hydronephrosis. Normal symmetric renal contrast enhancement.

Stomach/Bowel: No abnormal bowel wall thickening or evidence for
bowel obstruction.

Vascular/Lymphatic: Normal caliber abdominal aorta. No
retroperitoneal lymphadenopathy.

Other:  None.

Musculoskeletal: No abnormal osseous marrow signal.
IMPRESSION: Stable appearing indeterminate lesion within the left hepatic lobe.
Recommend an additional follow-up abdominal MRI with and without
contrast in 6 months to ensure continued stability.

Findings suggestive of confluent hepatic fibrosis throughout the
liver.

## 2017-12-02 ENCOUNTER — Other Ambulatory Visit: Payer: Self-pay | Admitting: Internal Medicine

## 2018-01-02 ENCOUNTER — Other Ambulatory Visit: Payer: Self-pay

## 2018-01-02 DIAGNOSIS — E039 Hypothyroidism, unspecified: Secondary | ICD-10-CM

## 2018-01-02 MED ORDER — METFORMIN HCL 1000 MG PO TABS: 1000.0000 mg | ORAL_TABLET | Freq: Two times a day (BID) | ORAL | 0 refills | Status: DC

## 2018-01-02 MED ORDER — LEVOTHYROXINE SODIUM 100 MCG PO TABS: ORAL_TABLET | ORAL | 0 refills | Status: DC

## 2018-02-09 ENCOUNTER — Encounter: Payer: Self-pay | Admitting: Family Medicine

## 2018-02-09 ENCOUNTER — Ambulatory Visit (INDEPENDENT_AMBULATORY_CARE_PROVIDER_SITE_OTHER): Payer: 59 | Admitting: Family Medicine

## 2018-02-09 VITALS — BP 138/93 | HR 90 | Temp 98.5°F | Resp 18 | Ht 69.0 in | Wt 338.0 lb

## 2018-02-09 DIAGNOSIS — R339 Retention of urine, unspecified: Secondary | ICD-10-CM | POA: Diagnosis not present

## 2018-02-09 DIAGNOSIS — E039 Hypothyroidism, unspecified: Secondary | ICD-10-CM | POA: Diagnosis not present

## 2018-02-09 DIAGNOSIS — D860 Sarcoidosis of lung: Secondary | ICD-10-CM

## 2018-02-09 DIAGNOSIS — J4520 Mild intermittent asthma, uncomplicated: Secondary | ICD-10-CM

## 2018-02-09 DIAGNOSIS — I10 Essential (primary) hypertension: Secondary | ICD-10-CM

## 2018-02-09 DIAGNOSIS — E118 Type 2 diabetes mellitus with unspecified complications: Secondary | ICD-10-CM

## 2018-02-09 LAB — POCT URINALYSIS DIPSTICK
Bilirubin, UA: NEGATIVE
Blood, UA: NEGATIVE
Glucose, UA: NEGATIVE
Ketones, UA: NEGATIVE
Nitrite, UA: NEGATIVE
Protein, UA: NEGATIVE
Spec Grav, UA: >=1.03 — AB (ref 1.010–1.025)
Urobilinogen, UA: 0.2 E.U./dL
pH, UA: 5.5 (ref 5.0–8.0)

## 2018-02-09 MED ORDER — AMLODIPINE BESYLATE 5 MG PO TABS: 5.0000 mg | ORAL_TABLET | Freq: Every day | ORAL | 3 refills | Status: DC

## 2018-02-09 MED ORDER — MONTELUKAST SODIUM 10 MG PO TABS: ORAL_TABLET | ORAL | 0 refills | Status: DC

## 2018-02-09 MED ORDER — METFORMIN HCL 1000 MG PO TABS: 1000.0000 mg | ORAL_TABLET | Freq: Two times a day (BID) | ORAL | 2 refills | Status: DC

## 2018-02-09 MED ORDER — LEVOTHYROXINE SODIUM 100 MCG PO TABS: ORAL_TABLET | ORAL | 3 refills | Status: DC

## 2018-02-09 NOTE — Progress Notes (Signed)
[facility]   Progress Note: General Provider: [name], FNP  SUBJECTIVE:   Majed Dawson is a 42 y.o. adult who  has a past medical history of Allergy, Depression, Diabetes ([place]) (10/03/2015), HIV disease ([place]), and Immune deficiency disorder ([place]).. Patient presents today for Follow-up (wants routine labs ); Breast Problem (wants be referred to have breast reduction); Edema (under legs from injection in past ); and Hypertension Transgender M to F. Patient with a hx of unregulated  silicon injections. Patient states that the hips and the breast are "inflammed and hard".  Patient states she would like to have breast reduction surgery. Reports that she has implants and received black market silicone injections. Reports that her breasts are continuing to grow and she is having back pain due to this.   Patient states that she was going through the process of bariatric surgery. Missed 2 appointments and states that she now has to restart the process.  Patient states that she missed her last pulmonologist appt. Patient called and rescheduled.   Review of Systems  Constitutional: Negative.   HENT: Negative.   Eyes: Negative.   Respiratory: Negative.   Cardiovascular: Negative.   Gastrointestinal: Negative.   Genitourinary: Negative.   Musculoskeletal: Negative.   Skin: Negative.        Hardening of the skin due to past injections.   Neurological: Negative.   Psychiatric/Behavioral: Negative.      OBJECTIVE: BP (!) 138/93 (BP Location: Left Arm, Patient Position: Sitting, Cuff Size: Large)   Pulse 90   Temp 98.5 F (36.9 C) (Oral)   Resp 18   Ht 5\' 9"  (1.753 m)   Wt (!) 338 lb (153.3 kg)   SpO2 97%   BMI 49.91 kg/m   Physical Exam  Constitutional: She is oriented to person, place, and time. She appears well-developed and well-nourished. No distress.  HENT:  Head: Normocephalic and atraumatic.  Eyes: Pupils are equal, round,  and reactive to light. Conjunctivae and EOM are normal.  Neck: Normal range of motion.  Cardiovascular: Normal rate, regular rhythm, normal heart sounds and intact distal pulses.  Pulmonary/Chest: Effort normal and breath sounds normal. No respiratory distress.  Abdominal: Soft. Bowel sounds are normal. She exhibits no distension.  Musculoskeletal: Normal range of motion.  Neurological: She is alert and oriented to person, place, and time.  Skin: Skin is warm and dry.  Bilateral lateral thighs are indurated and discolored. No signs of infection noted. Non-tender with palpation.   Psychiatric: She has a normal mood and affect. Her behavior is normal. Judgment and thought content normal.  Nursing note and vitals reviewed.   ASSESSMENT/PLAN:   1. Essential hypertension Continue with current medications. Encouraged weight loss  - Urinalysis Dipstick - amLODipine (NORVASC) 5 MG tablet; Take 1 tablet (5 mg total) by mouth daily.  Dispense: 90 tablet; Refill: 3  2. Mild intermittent asthma without complication Patient followed by pulmonology. Missed last appointment and needs refills today.  - montelukast (SINGULAIR) 10 MG tablet; TAKE 1 TABLET(10 MG) BY MOUTH AT BEDTIME  Dispense: 90 tablet; Refill: 0  3. Hypothyroidism (acquired) Pending labs. Will adjust medications accordingly.    - levothyroxine (SYNTHROID, LEVOTHROID) 100 MCG tablet; TAKE 1 TABLET(100 MCG) BY MOUTH DAILY BEFORE BREAKFAST  Dispense: 90 tablet; Refill: 3 - Thyroid Panel With TSH - Comprehensive metabolic panel  4. Urinary retention Patient seen in the past for this. Has not followed up.  - Ambulatory referral to Urology  5. Type 2 diabetes mellitus with complication, without long-term current use of insulin (HCC) A1C- 5.7 and below the goal of 7. Continue with current medication.  - metFORMIN (GLUCOPHAGE) 1000 MG tablet; Take 1 tablet (1,000 mg total) by mouth 2 (two) times daily with a meal.  Dispense: 180 tablet;  Refill: 2 - Lipid Panel - Comprehensive metabolic panel  6. Sarcoidosis of lung ([place]) Followed by pulmonology. Missed last appointment .     With regards to hardening of the skin, patient advised to seek further evaluation by plastic surgeon. Patient states that she does not have the money to do so. Advised patient that she has gained a significant amount of weight and this can contribute to her increase in breast size. Advised patient to restart bariatric surgery endeavors. This provider will not prescribe phentermine due to her hypertension.   25 minutes spent with patient face to face.   The patient was given clear instructions to go to ER or return to medical center if symptoms do not improve, worsen or new problems develop. The patient verbalized understanding and agreed with plan of care.   Ms. [name], FNP-BC [facility] [address] [place], [place] [address] [phone]     This note has been created with Dragon speech recognition software and smart phrase technology. Any transcriptional errors are unintentional.

## 2018-02-10 LAB — COMPREHENSIVE METABOLIC PANEL
ALT: 11 IU/L (ref 0–44)
AST: 35 IU/L (ref 0–40)
Albumin/Globulin Ratio: 1.2 (ref 1.2–2.2)
Albumin: 4.2 g/dL (ref 3.5–5.5)
Alkaline Phosphatase: 93 IU/L (ref 39–117)
BUN/Creatinine Ratio: 14 (ref 9–20)
BUN: 17 mg/dL (ref 6–24)
Bilirubin Total: 0.2 mg/dL (ref 0.0–1.2)
CO2: 21 mmol/L (ref 20–29)
Calcium: 10 mg/dL (ref 8.7–10.2)
Chloride: 99 mmol/L (ref 96–106)
Creatinine, Ser: 1.19 mg/dL (ref 0.76–1.27)
GFR calc Af Amer: 86 mL/min/{1.73_m2} (ref >59)
GFR calc non Af Amer: 74 mL/min/{1.73_m2} (ref >59)
Globulin, Total: 3.6 g/dL (ref 1.5–4.5)
Glucose: 103 mg/dL — ABNORMAL HIGH (ref 65–99)
Potassium: 5.2 mmol/L (ref 3.5–5.2)
Sodium: 137 mmol/L (ref 134–144)
Total Protein: 7.8 g/dL (ref 6.0–8.5)

## 2018-02-10 LAB — THYROID PANEL WITH TSH
Free Thyroxine Index: 2.9 (ref 1.2–4.9)
T3 Uptake Ratio: 38 % (ref 24–39)
T4, Total: 7.6 ug/dL (ref 4.5–12.0)
TSH: 1.67 u[IU]/mL (ref 0.450–4.500)

## 2018-02-10 LAB — LIPID PANEL
Chol/HDL Ratio: 4.3 ratio (ref 0.0–5.0)
Cholesterol, Total: 169 mg/dL (ref 100–199)
HDL: 39 mg/dL — ABNORMAL LOW (ref >39)
LDL Calculated: 103 mg/dL — ABNORMAL HIGH (ref 0–99)
Triglycerides: 137 mg/dL (ref 0–149)
VLDL Cholesterol Cal: 27 mg/dL (ref 5–40)

## 2018-03-15 ENCOUNTER — Ambulatory Visit (INDEPENDENT_AMBULATORY_CARE_PROVIDER_SITE_OTHER): Payer: 59 | Admitting: Internal Medicine

## 2018-03-15 ENCOUNTER — Encounter: Payer: Self-pay | Admitting: Internal Medicine

## 2018-03-15 DIAGNOSIS — Z6841 Body Mass Index (BMI) 40.0 and over, adult: Secondary | ICD-10-CM

## 2018-03-15 DIAGNOSIS — A528 Late syphilis, latent: Secondary | ICD-10-CM

## 2018-03-15 DIAGNOSIS — B20 Human immunodeficiency virus [HIV] disease: Secondary | ICD-10-CM | POA: Diagnosis not present

## 2018-03-15 DIAGNOSIS — Z23 Encounter for immunization: Secondary | ICD-10-CM

## 2018-03-15 DIAGNOSIS — F320 Major depressive disorder, single episode, mild: Secondary | ICD-10-CM

## 2018-03-15 DIAGNOSIS — Z888 Allergy status to other drugs, medicaments and biological substances status: Secondary | ICD-10-CM

## 2018-03-15 DIAGNOSIS — F329 Major depressive disorder, single episode, unspecified: Secondary | ICD-10-CM | POA: Diagnosis not present

## 2018-03-15 DIAGNOSIS — Z8619 Personal history of other infectious and parasitic diseases: Secondary | ICD-10-CM

## 2018-03-15 DIAGNOSIS — Z79899 Other long term (current) drug therapy: Secondary | ICD-10-CM

## 2018-03-15 NOTE — Assessment & Plan Note (Signed)
She went back to her old eating habits after moving back home to the [place], [place].  I asked her to recommit to her diet and reentering the bariatric program here.

## 2018-03-15 NOTE — Assessment & Plan Note (Signed)
Her infection has been under excellent, long-term control.  She will continue Biktarvy and get repeat blood work today.  She received her influenza vaccine today.  She will follow-up in 6 months.

## 2018-03-15 NOTE — Progress Notes (Signed)
Patient Active Problem List   Diagnosis Date Noted  . Sarcoidosis of lung ([place]) 01/31/2014    Priority: High  . Human immunodeficiency virus (HIV) disease ([place]) 04/26/2006    Priority: High  . Depression 04/26/2006    Priority: Medium  . Hip pain, bilateral 04/27/2016  . Urine leukocytes 04/27/2016  . Weight gain 04/27/2016  . Urinary hesitancy 04/27/2016  . Liver mass 04/05/2016  . Abdominal pain 03/09/2016  . 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 GERD (gastroesophageal reflux disease) 08/15/2014  . Pulmonary infiltrates 07/25/2014  . Bronchitis, chronic obstructive ([place]) 07/16/2014  . SOB (shortness of breath) 02/04/2014  . Syncope 02/04/2014  . Immunization due 01/31/2014  . Tachycardia 01/31/2014  . Generalized swelling, mass, or lump of abdomen or pelvis 09/21/2013  . Other allergic rhinitis 09/21/2013  . Essential hypertension 09/21/2013  . Preventative health care 09/21/2013  . Wheezing 08/07/2013  . Peripheral neuropathy 08/31/2010  . MOLE 10/14/2009  . SKIN RASH 10/14/2009  . MORBID OBESITY 04/22/2009  . 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 ALLERGIC RHINITIS 04/26/2006  . Hilar adenopathy 04/26/2006    Patient's Medications  New Prescriptions   No medications on file  Previous Medications   AMLODIPINE (NORVASC) 5 MG TABLET    Take 1 tablet (5 mg total) by mouth daily.   BECLOMETHASONE DIPROP HFA (QVAR REDIHALER) 40 MCG/ACT AERB    Inhale 1 puff into the lungs daily.   BICTEGRAVIR-EMTRICITABINE-TENOFOVIR AF (BIKTARVY) 50-200-25 MG TABS TABLET    Take 1 tablet by mouth daily.   BLOOD GLUCOSE METER KIT AND SUPPLIES KIT    Dispense based on patient and insurance preference. Use up to four times daily as directed. (FOR ICD-9 250.00, 250.01).   LEVOTHYROXINE (SYNTHROID, LEVOTHROID) 100 MCG TABLET    Take 1 tablet by mouth once daily   LEVOTHYROXINE (SYNTHROID, LEVOTHROID) 100 MCG  TABLET    TAKE 1 TABLET(100 MCG) BY MOUTH DAILY BEFORE BREAKFAST   METFORMIN (GLUCOPHAGE) 1000 MG TABLET    Take 1 tablet (1,000 mg total) by mouth 2 (two) times daily with a meal.   MONTELUKAST (SINGULAIR) 10 MG TABLET    TAKE 1 TABLET(10 MG) BY MOUTH AT BEDTIME   NAPROXEN (NAPROSYN) 500 MG TABLET    TAKE 1 TABLET(500 MG) BY MOUTH TWICE DAILY WITH A MEAL   NEBULIZERS (COMPRESSOR NEBULIZER) MISC    1 Device by Does not apply route every 6 (six) hours as needed.   PREDNISONE (DELTASONE) 10 MG TABLET    TAKE 1 TABLET(10 MG) BY MOUTH DAILY WITH BREAKFAST   TAMSULOSIN (FLOMAX) 0.4 MG CAPS CAPSULE    Take 1 capsule by mouth daily.   VENTOLIN HFA 108 (90 BASE) MCG/ACT INHALER    INHALE 2 PUFFS INTO THE LUNGS EVERY 6 HOURS AS NEEDED WHEEZING OR SHORTNESS OF BREATH  Modified Medications   No medications on file  Discontinued Medications   No medications on file    Subjective: Robert Dawson is in for her routine HIV follow-up visit.  She has had no problems obtaining, taking or tolerating her Biktarvy.  She takes it each morning when she first gets up.  She does not recall missing any doses.  She is back home living in [place].  She enjoys being closer to family.  She says that she still has her "down" moments but is feeling less depressed since moving back home.  She was in the bariatric weight loss program here before moving.  She has regained some of the weight she lost.  She is trying to get back into the program now.  She has been sexually active with her "ex" but says that he always uses condoms.  Review of Systems: Review of Systems  Constitutional: Negative for chills, diaphoresis, fever, malaise/fatigue and weight loss.  HENT: Negative for sore throat.   Respiratory: Negative for cough, sputum production and shortness of breath.   Cardiovascular: Negative for chest pain.  Gastrointestinal: Negative for abdominal pain, diarrhea, heartburn, nausea and vomiting.  Genitourinary: Negative  for dysuria and frequency.  Musculoskeletal: Negative for joint pain and myalgias.  Skin: Negative for rash.  Neurological: Negative for dizziness and headaches.  Psychiatric/Behavioral: Positive for depression. Negative for substance abuse and suicidal ideas. The patient is not nervous/anxious.     Past Medical History:  Diagnosis Date  . Allergy   . Depression   . Diabetes ([place]) 10/03/2015  . HIV disease ([place])   . Immune deficiency disorder [facility])     Social History   Tobacco Use  . Smoking status: Never Smoker  . Smokeless tobacco: Never Used  Substance Use Topics  . Alcohol use: No    Alcohol/week: 0.0 standard drinks  . Drug use: No    Family History  Problem Relation Age of Onset  . Diabetes Maternal Grandmother   . Hypertension Maternal Grandmother     Allergies  Allergen Reactions  . Lisinopril Cough    Health Maintenance  Topic Date Due  . OPHTHALMOLOGY EXAM  03/17/2017  . FOOT EXAM  04/27/2017  . URINE MICROALBUMIN  07/27/2017  . HEMOGLOBIN A1C  07/28/2017  . TETANUS/TDAP  02/01/2024  . PNEUMOCOCCAL POLYSACCHARIDE VACCINE AGE 9-64 HIGH RISK  Completed  . HIV Screening  Completed    Objective:  Vitals:   03/15/18 1348  BP: 137/73  Pulse: (!) 111  Temp: 98.2 F (36.8 C)  TempSrc: Oral  Weight: (!) 329 lb (149.2 kg)   Body mass index is 48.58 kg/m.  Physical Exam Constitutional:      Comments: She is cheerful and in good spirits today.  HENT:     Mouth/Throat:     Pharynx: No oropharyngeal exudate.  Eyes:     Conjunctiva/sclera: Conjunctivae normal.  Cardiovascular:     Rate and Rhythm: Normal rate and regular rhythm.     Heart sounds: No murmur.  Pulmonary:     Effort: Pulmonary effort is normal.     Breath sounds: Normal breath sounds.  Abdominal:     Palpations: Abdomen is soft. There is no mass.     Tenderness: There is no abdominal tenderness.  Musculoskeletal: Normal range of motion.  Skin:    Findings: No rash.    Neurological:     Mental Status: She is alert and oriented to person, place, and time.  Psychiatric:        Mood and Affect: Mood normal.     Lab Results Lab Results  Component Value Date   WBC 7.6 08/30/2017   HGB 14.3 08/30/2017   HCT 41.0 08/30/2017   MCV 86.0 08/30/2017   PLT 282 08/30/2017    Lab Results  Component Value Date   CREATININE 1.19 02/09/2018   BUN 17 02/09/2018   NA 137 02/09/2018   K 5.2 02/09/2018   CL 99 02/09/2018   CO2 21 02/09/2018    Lab Results  Component Value Date   ALT 11  02/09/2018   AST 35 02/09/2018   ALKPHOS 93 02/09/2018   BILITOT 0.2 02/09/2018    Lab Results  Component Value Date   CHOL 169 02/09/2018   HDL 39 (L) 02/09/2018   LDLCALC 103 (H) 02/09/2018   TRIG 137 02/09/2018   CHOLHDL 4.3 02/09/2018   Lab Results  Component Value Date   LABRPR REACTIVE (A) 08/30/2017   RPRTITER 1:4 (H) 08/30/2017   HIV 1 RNA Quant (copies/mL)  Date Value  08/30/2017 <20 NOT DETECTED  03/09/2017 28 (H)  08/26/2016 <20 DETECTED (A)   CD4 T Cell Abs (/uL)  Date Value  08/30/2017 630  03/09/2017 600  08/26/2016 510     Problem List Items Addressed This Visit      High   Human immunodeficiency virus (HIV) disease ([place])    Her infection has been under excellent, long-term control.  She will continue Biktarvy and get repeat blood work today.  She received her influenza vaccine today.  She will follow-up in 6 months.      Relevant Orders   T-helper cell (CD4)- ([facility] clinic only)   HIV-1 RNA quant-no reflex-bld   RPR     Medium   Depression    Her depression seems to be doing better.  I did give her Thereasa Parkin card so she can follow-up with counseling as needed.        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  I will repeat her RPR today.      MORBID OBESITY    She went back to her old eating habits after moving back home to the  [place], [place].  I asked her to recommit to her diet and reentering the bariatric program here.           Michel Bickers, MD [facility] [phone] pager   [phone] cell 03/15/2018, 2:13 PM

## 2018-03-15 NOTE — Assessment & Plan Note (Signed)
Her depression seems to be doing better.  I did give her Robert Dawson card so she can follow-up with counseling as needed.

## 2018-03-15 NOTE — Assessment & Plan Note (Signed)
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  I will repeat her RPR today.

## 2018-03-16 ENCOUNTER — Other Ambulatory Visit: Payer: Self-pay | Admitting: Internal Medicine

## 2018-03-16 LAB — T-HELPER CELL (CD4) - (RCID CLINIC ONLY)
CD4 T CELL ABS: 360 /uL — AB (ref 400–2700)
CD4 T CELL HELPER: 19 % — AB (ref 33–55)

## 2018-03-17 LAB — RPR: RPR Ser Ql: REACTIVE — AB

## 2018-03-17 LAB — HIV-1 RNA QUANT-NO REFLEX-BLD
HIV 1 RNA QUANT: 38 {copies}/mL — AB
HIV-1 RNA Quant, Log: 1.58 Log copies/mL — ABNORMAL HIGH

## 2018-03-17 LAB — FLUORESCENT TREPONEMAL AB(FTA)-IGG-BLD: Fluorescent Treponemal ABS: REACTIVE — AB

## 2018-03-17 LAB — RPR TITER: RPR Titer: 1:2 {titer} — ABNORMAL HIGH

## 2018-03-28 ENCOUNTER — Other Ambulatory Visit: Payer: Self-pay | Admitting: Internal Medicine

## 2018-05-01 ENCOUNTER — Ambulatory Visit: Payer: Self-pay | Admitting: Internal Medicine

## 2018-07-07 ENCOUNTER — Other Ambulatory Visit: Payer: Self-pay | Admitting: Internal Medicine

## 2018-07-31 ENCOUNTER — Other Ambulatory Visit: Payer: Self-pay

## 2018-07-31 DIAGNOSIS — J4520 Mild intermittent asthma, uncomplicated: Secondary | ICD-10-CM

## 2018-07-31 MED ORDER — MONTELUKAST SODIUM 10 MG PO TABS: ORAL_TABLET | ORAL | 1 refills | Status: DC

## 2018-08-01 DIAGNOSIS — Z01818 Encounter for other preprocedural examination: Secondary | ICD-10-CM | POA: Diagnosis not present

## 2018-08-09 MED ORDER — SIMETHICONE 40 MG/0.6ML PO SUSP
80.00 | ORAL | Status: DC
Start: ? — End: 2018-08-09

## 2018-08-09 MED ORDER — ONDANSETRON HCL 4 MG PO TABS
4.00 | ORAL_TABLET | ORAL | Status: DC
Start: ? — End: 2018-08-09

## 2018-08-09 MED ORDER — HYDROMORPHONE HCL 1 MG/ML IJ SOLN
0.20 | INTRAMUSCULAR | Status: DC
Start: ? — End: 2018-08-09

## 2018-08-09 MED ORDER — GENERIC EXTERNAL MEDICATION
Status: DC
Start: ? — End: 2018-08-09

## 2018-08-09 MED ORDER — SODIUM CHLORIDE 0.9 % IV SOLN
1000.00 | INTRAVENOUS | Status: DC
Start: ? — End: 2018-08-09

## 2018-08-09 MED ORDER — BICTEGRAVIR-EMTRICITAB-TENOFOV 50-200-25 MG PO TABS
1.00 | ORAL_TABLET | ORAL | Status: DC
Start: 2018-08-09 — End: 2018-08-09

## 2018-08-09 MED ORDER — DIPHENHYDRAMINE HCL 50 MG/ML IJ SOLN
25.00 | INTRAMUSCULAR | Status: DC
Start: ? — End: 2018-08-09

## 2018-08-09 MED ORDER — GABAPENTIN 300 MG PO CAPS
300.00 | ORAL_CAPSULE | ORAL | Status: DC
Start: 2018-08-08 — End: 2018-08-09

## 2018-08-09 MED ORDER — POTASSIUM CHLORIDE IN NACL 20-0.9 MEQ/L-% IV SOLN
100.00 | INTRAVENOUS | Status: DC
Start: ? — End: 2018-08-09

## 2018-08-09 MED ORDER — NALOXONE HCL 0.4 MG/ML IJ SOLN
0.40 | INTRAMUSCULAR | Status: DC
Start: ? — End: 2018-08-09

## 2018-08-09 MED ORDER — GENERIC EXTERNAL MEDICATION
0.08 | Status: DC
Start: ? — End: 2018-08-09

## 2018-08-09 MED ORDER — GENERIC EXTERNAL MEDICATION
.04 | Status: DC
Start: ? — End: 2018-08-09

## 2018-08-09 MED ORDER — CELECOXIB 200 MG PO CAPS
200.00 | ORAL_CAPSULE | ORAL | Status: DC
Start: 2018-08-08 — End: 2018-08-09

## 2018-08-09 MED ORDER — SUCRALFATE 1 GM/10ML PO SUSP
1.00 | ORAL | Status: DC
Start: 2018-08-08 — End: 2018-08-09

## 2018-08-09 MED ORDER — HEPARIN SODIUM (PORCINE) 5000 UNIT/ML IJ SOLN
5000.00 | INTRAMUSCULAR | Status: DC
Start: 2018-08-08 — End: 2018-08-09

## 2018-08-09 MED ORDER — HYDRALAZINE HCL 20 MG/ML IJ SOLN
10.00 | INTRAMUSCULAR | Status: DC
Start: ? — End: 2018-08-09

## 2018-08-09 MED ORDER — CLONIDINE HCL 0.1 MG PO TABS
.10 | ORAL_TABLET | ORAL | Status: DC
Start: ? — End: 2018-08-09

## 2018-08-09 MED ORDER — ONDANSETRON HCL 4 MG/2ML IJ SOLN
8.00 | INTRAMUSCULAR | Status: DC
Start: ? — End: 2018-08-09

## 2018-08-09 MED ORDER — DEXAMETHASONE SODIUM PHOSPHATE 10 MG/ML IJ SOLN
10.00 | INTRAMUSCULAR | Status: DC
Start: ? — End: 2018-08-09

## 2018-08-09 MED ORDER — PANTOPRAZOLE SODIUM 40 MG PO TBEC
40.00 | DELAYED_RELEASE_TABLET | ORAL | Status: DC
Start: 2018-08-08 — End: 2018-08-09

## 2018-08-09 MED ORDER — LEVOTHYROXINE SODIUM 100 MCG PO TABS
100.00 | ORAL_TABLET | ORAL | Status: DC
Start: ? — End: 2018-08-09

## 2018-08-09 MED ORDER — ACETAMINOPHEN 160 MG/5ML PO SUSP
650.00 | ORAL | Status: DC
Start: 2018-08-08 — End: 2018-08-09

## 2018-08-10 ENCOUNTER — Ambulatory Visit (INDEPENDENT_AMBULATORY_CARE_PROVIDER_SITE_OTHER): Payer: 59 | Admitting: Family Medicine

## 2018-08-10 ENCOUNTER — Other Ambulatory Visit: Payer: Self-pay

## 2018-08-10 DIAGNOSIS — D860 Sarcoidosis of lung: Secondary | ICD-10-CM | POA: Diagnosis not present

## 2018-08-10 DIAGNOSIS — I10 Essential (primary) hypertension: Secondary | ICD-10-CM | POA: Diagnosis not present

## 2018-08-10 DIAGNOSIS — B20 Human immunodeficiency virus [HIV] disease: Secondary | ICD-10-CM

## 2018-08-10 MED ORDER — GENERIC EXTERNAL MEDICATION
Status: DC
Start: ? — End: 2018-08-10

## 2018-08-10 NOTE — Progress Notes (Signed)
  Patient [facility]  Virtual Visit via Telephone Note  I connected with Robert Dawson on 08/10/18 at  2:00 PM EDT by telephone and verified that I am speaking with the correct person using two identifiers.   I discussed the limitations, risks, security and privacy concerns of performing an evaluation and management service by telephone and the availability of in person appointments. I also discussed with the patient that there may be a patient responsible charge related to this service. The patient expressed understanding and agreed to proceed.   History of Present Illness: Robert Dawson  has a past medical history of Allergy, Depression, Diabetes ([place]) (10/03/2015), HIV disease ([place]), and Immune deficiency disorder ([place]).  Patient states that she had gastric Bypass on 08/07/2018. She states that she was instructed to stop her medications except biktarvy. She states that she is doing well. No nausea, vomiting or abdominal discomfort. She does not need refills at the present time.  Observations/Objective: Patient with regular voice tone, rate and rhythm. Speaking calmly and is in no apparent distress.    Assessment and Plan: 1. Human immunodeficiency virus (HIV) disease ([place])  2. Sarcoidosis of lung ([place])  3. Essential hypertension  4. Class 3 severe obesity due to excess calories with serious comorbidity in adult, unspecified BMI ([place])  Patient with recent gastric bypass: 08/07/2018. Stopped all medications except HIV medication. She is to return to the clinic in 3 months for re-eval of obesity related diseases.   Follow Up Instructions:  We discussed hand washing, using hand sanitizer when soap and water are not available, only going out when absolutely necessary, and social distancing. Explained to patient that she is immunocompromised and will need to take precautions during this time.   I discussed the assessment and treatment plan with  the patient. The patient was provided an opportunity to ask questions and all were answered. The patient agreed with the plan and demonstrated an understanding of the instructions.   The patient was advised to call back or seek an in-person evaluation if the symptoms worsen or if the condition fails to improve as anticipated.  10 minutes of non-face-to-face time during this encounter.  Ms. Andr L. Nathaneil Canary, FNP-BC [facility] [address] [place], [place] [address] [phone]

## 2018-09-22 ENCOUNTER — Other Ambulatory Visit: Payer: Self-pay | Admitting: Internal Medicine

## 2018-10-12 ENCOUNTER — Encounter: Payer: Self-pay | Admitting: Internal Medicine

## 2018-10-12 ENCOUNTER — Other Ambulatory Visit: Payer: Self-pay

## 2018-10-12 ENCOUNTER — Ambulatory Visit (INDEPENDENT_AMBULATORY_CARE_PROVIDER_SITE_OTHER): Payer: 59 | Admitting: Internal Medicine

## 2018-10-12 DIAGNOSIS — Z9884 Bariatric surgery status: Secondary | ICD-10-CM | POA: Diagnosis not present

## 2018-10-12 DIAGNOSIS — B20 Human immunodeficiency virus [HIV] disease: Secondary | ICD-10-CM | POA: Diagnosis not present

## 2018-10-12 NOTE — Assessment & Plan Note (Signed)
Her adherence is perfect and her infection remains under good, long-term control.  She will continue Biktarvy and get repeat blood work today.  She will follow-up in 6 months.

## 2018-10-12 NOTE — Progress Notes (Signed)
Patient Active Problem List   Diagnosis Date Noted  . Sarcoidosis of lung ([place]) 01/31/2014    Priority: High  . Human immunodeficiency virus (HIV) disease ([place]) 04/26/2006    Priority: High  . Depression 04/26/2006    Priority: Medium  . 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 Hip pain, bilateral 04/27/2016  . Urine leukocytes 04/27/2016  . Weight gain 04/27/2016  . Urinary hesitancy 04/27/2016  . Liver mass 04/05/2016  . Abdominal pain 03/09/2016  . 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 GERD (gastroesophageal reflux disease) 08/15/2014  . Pulmonary infiltrates 07/25/2014  . Bronchitis, chronic obstructive ([place]) 07/16/2014  . SOB (shortness of breath) 02/04/2014  . Syncope 02/04/2014  . Immunization due 01/31/2014  . Tachycardia 01/31/2014  . Generalized swelling, mass, or lump of abdomen or pelvis 09/21/2013  . Other allergic rhinitis 09/21/2013  . Essential hypertension 09/21/2013  . Preventative health care 09/21/2013  . Wheezing 08/07/2013  . Peripheral neuropathy 08/31/2010  . MOLE 10/14/2009  . SKIN RASH 10/14/2009  . MORBID OBESITY 04/22/2009  . 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 ALLERGIC RHINITIS 04/26/2006  . Hilar adenopathy 04/26/2006    Patient's Medications  New Prescriptions   No medications on file  Previous Medications   BIKTARVY 50-200-25 MG TABS TABLET    TAKE 1 TABLET BY MOUTH DAILY   BLOOD GLUCOSE METER KIT AND SUPPLIES KIT    Dispense based on patient and insurance preference. Use up to four times daily as directed. (FOR ICD-9 250.00, 250.01).  Modified Medications   No medications on file  Discontinued Medications   No medications on file    Subjective: Robert Dawson is in for her routine HIV follow-up surgery.  She has had no problems obtaining, taking or tolerating her Biktarvy and does not recall missing any doses.  She had gastric bypass surgery 2  months ago and has already lost 80 pounds.  She is getting regular exercise.  She has been sexually active with her "ex" but has used condoms consistently.  Review of Systems: Review of Systems  Constitutional: Positive for weight loss. Negative for fever.  Respiratory: Negative for cough and shortness of breath.   Cardiovascular: Negative for chest pain.  Gastrointestinal: Negative for abdominal pain, diarrhea, nausea and vomiting.  Psychiatric/Behavioral: Negative for depression.    Past Medical History:  Diagnosis Date  . Allergy   . Depression   . Diabetes ([place]) 10/03/2015  . HIV disease ([place])   . Immune deficiency disorder [facility])     Social History   Tobacco Use  . Smoking status: Never Smoker  . Smokeless tobacco: Never Used  Substance Use Topics  . Alcohol use: No    Alcohol/week: 0.0 standard drinks  . Drug use: No    Family History  Problem Relation Age of Onset  . Diabetes Maternal Grandmother   . Hypertension Maternal Grandmother     Allergies  Allergen Reactions  . Lisinopril Cough    Health Maintenance  Topic Date Due  . OPHTHALMOLOGY EXAM  03/17/2017  . FOOT EXAM  04/27/2017  . URINE MICROALBUMIN  07/27/2017  . HEMOGLOBIN A1C  07/28/2017  . TETANUS/TDAP  02/01/2024  . PNEUMOCOCCAL POLYSACCHARIDE VACCINE AGE 93-64 HIGH RISK  Completed  . HIV Screening  Completed    Objective:  Vitals:   10/12/18 1442  BP: 110/79  Pulse: (!) 105  Weight: 248 lb (  112.5 kg)  Height: '5\' 9"'  (1.753 m)   Body mass index is 36.62 kg/m.  Physical Exam Constitutional:      Comments: She has had dramatic weight loss and is very happy today.  Cardiovascular:     Rate and Rhythm: Normal rate and regular rhythm.     Heart sounds: No murmur.  Pulmonary:     Effort: Pulmonary effort is normal.     Breath sounds: Normal breath sounds.  Abdominal:     Palpations: Abdomen is soft.     Tenderness: There is no abdominal tenderness.  Psychiatric:        Mood and  Affect: Mood normal.     Lab Results Lab Results  Component Value Date   WBC 7.6 08/30/2017   HGB 14.3 08/30/2017   HCT 41.0 08/30/2017   MCV 86.0 08/30/2017   PLT 282 08/30/2017    Lab Results  Component Value Date   CREATININE 1.19 02/09/2018   BUN 17 02/09/2018   NA 137 02/09/2018   K 5.2 02/09/2018   CL 99 02/09/2018   CO2 21 02/09/2018    Lab Results  Component Value Date   ALT 11 02/09/2018   AST 35 02/09/2018   ALKPHOS 93 02/09/2018   BILITOT 0.2 02/09/2018    Lab Results  Component Value Date   CHOL 169 02/09/2018   HDL 39 (L) 02/09/2018   LDLCALC 103 (H) 02/09/2018   TRIG 137 02/09/2018   CHOLHDL 4.3 02/09/2018   Lab Results  Component Value Date   LABRPR REACTIVE (A) 03/15/2018   RPRTITER 1:2 (H) 03/15/2018   HIV 1 RNA Quant (copies/mL)  Date Value  03/15/2018 38 (H)  08/30/2017 <20 NOT DETECTED  03/09/2017 28 (H)   CD4 T Cell Abs (/uL)  Date Value  03/15/2018 360 (L)  08/30/2017 630  03/09/2017 600     Problem List Items Addressed This Visit      High   Human immunodeficiency virus (HIV) disease ([place])    Her adherence is perfect and her infection remains under good, long-term control.  She will continue Biktarvy and get repeat blood work today.  She will follow-up in 6 months.      Relevant Orders   T-helper cell (CD4)- ([facility] clinic only)   HIV-1 RNA quant-no reflex-bld   CBC   Comprehensive metabolic panel   RPR   Lipid panel     Unprioritized   Status post gastric bypass for obesity        Michel Bickers, MD [facility] for [place] [phone] pager   [phone] cell 10/12/2018, 2:56 PM

## 2018-10-13 LAB — T-HELPER CELL (CD4) - (RCID CLINIC ONLY)
CD4 % Helper T Cell: 28 % — ABNORMAL LOW (ref 33–65)
CD4 T Cell Abs: 700 /uL (ref 400–1790)

## 2018-10-18 LAB — CBC
HCT: 42.1 % (ref 38.5–50.0)
Hemoglobin: 14.5 g/dL (ref 13.2–17.1)
MCH: 30.6 pg (ref 27.0–33.0)
MCHC: 34.4 g/dL (ref 32.0–36.0)
MCV: 88.8 fL (ref 80.0–100.0)
MPV: 11.7 fL (ref 7.5–12.5)
Platelets: 302 10*3/uL (ref 140–400)
RBC: 4.74 10*6/uL (ref 4.20–5.80)
RDW: 15.6 % — ABNORMAL HIGH (ref 11.0–15.0)
WBC: 5.5 10*3/uL (ref 3.8–10.8)

## 2018-10-18 LAB — RPR TITER: RPR Titer: 1:2 {titer} — ABNORMAL HIGH

## 2018-10-18 LAB — COMPREHENSIVE METABOLIC PANEL
AG Ratio: 1 (calc) (ref 1.0–2.5)
ALT: 8 U/L — ABNORMAL LOW (ref 9–46)
AST: 33 U/L (ref 10–40)
Albumin: 3.3 g/dL — ABNORMAL LOW (ref 3.6–5.1)
Alkaline phosphatase (APISO): 86 U/L (ref 36–130)
BUN/Creatinine Ratio: 5 (calc) — ABNORMAL LOW (ref 6–22)
BUN: 6 mg/dL — ABNORMAL LOW (ref 7–25)
CO2: 26 mmol/L (ref 20–32)
Calcium: 9.6 mg/dL (ref 8.6–10.3)
Chloride: 104 mmol/L (ref 98–110)
Creat: 1.13 mg/dL (ref 0.60–1.35)
Globulin: 3.4 g/dL (calc) (ref 1.9–3.7)
Glucose, Bld: 96 mg/dL (ref 65–99)
Potassium: 4.2 mmol/L (ref 3.5–5.3)
Sodium: 139 mmol/L (ref 135–146)
Total Bilirubin: 0.4 mg/dL (ref 0.2–1.2)
Total Protein: 6.7 g/dL (ref 6.1–8.1)

## 2018-10-18 LAB — LIPID PANEL
Cholesterol: 106 mg/dL (ref <200)
HDL: 33 mg/dL — ABNORMAL LOW (ref >=40)
LDL Cholesterol (Calc): 57 mg/dL (calc)
Non-HDL Cholesterol (Calc): 73 mg/dL (calc) (ref <130)
Total CHOL/HDL Ratio: 3.2 (calc) (ref <5.0)
Triglycerides: 84 mg/dL (ref <150)

## 2018-10-18 LAB — HIV-1 RNA QUANT-NO REFLEX-BLD
HIV 1 RNA Quant: <20 copies/mL
HIV-1 RNA Quant, Log: <1.3 Log copies/mL

## 2018-10-18 LAB — FLUORESCENT TREPONEMAL AB(FTA)-IGG-BLD: Fluorescent Treponemal ABS: REACTIVE — AB

## 2018-10-18 LAB — RPR: RPR Ser Ql: REACTIVE — AB

## 2018-10-20 ENCOUNTER — Other Ambulatory Visit: Payer: Self-pay | Admitting: Internal Medicine

## 2018-10-22 ENCOUNTER — Other Ambulatory Visit: Payer: Self-pay | Admitting: Internal Medicine

## 2018-11-10 ENCOUNTER — Ambulatory Visit: Payer: Self-pay | Admitting: Family Medicine

## 2018-11-15 ENCOUNTER — Ambulatory Visit: Payer: Self-pay | Admitting: Family Medicine

## 2018-11-17 ENCOUNTER — Ambulatory Visit: Payer: Self-pay | Admitting: Family Medicine

## 2018-11-24 ENCOUNTER — Encounter: Payer: Self-pay | Admitting: Family Medicine

## 2018-11-24 ENCOUNTER — Other Ambulatory Visit: Payer: Self-pay

## 2018-11-24 ENCOUNTER — Ambulatory Visit (INDEPENDENT_AMBULATORY_CARE_PROVIDER_SITE_OTHER): Payer: 59 | Admitting: Family Medicine

## 2018-11-24 VITALS — BP 114/83 | HR 92 | Temp 97.8°F | Resp 16 | Ht 69.0 in | Wt 236.0 lb

## 2018-11-24 DIAGNOSIS — Z23 Encounter for immunization: Secondary | ICD-10-CM | POA: Diagnosis not present

## 2018-11-24 DIAGNOSIS — E118 Type 2 diabetes mellitus with unspecified complications: Secondary | ICD-10-CM

## 2018-11-24 DIAGNOSIS — I10 Essential (primary) hypertension: Secondary | ICD-10-CM

## 2018-11-24 DIAGNOSIS — L989 Disorder of the skin and subcutaneous tissue, unspecified: Secondary | ICD-10-CM

## 2018-11-24 LAB — POCT URINALYSIS DIPSTICK
Blood, UA: NEGATIVE
Glucose, UA: NEGATIVE
Leukocytes, UA: NEGATIVE
Nitrite, UA: NEGATIVE
Protein, UA: POSITIVE — AB
Spec Grav, UA: >=1.03 — AB (ref 1.010–1.025)
Urobilinogen, UA: 1 E.U./dL
pH, UA: 5.5 (ref 5.0–8.0)

## 2018-11-24 LAB — POCT GLYCOSYLATED HEMOGLOBIN (HGB A1C): Hemoglobin A1C: 4.9 % (ref 4.0–5.6)

## 2018-11-24 NOTE — Progress Notes (Signed)
Patient [name]   Progress Note: General Provider: [name], FNP  SUBJECTIVE:   Robert Dawson is a 43 y.o. adult who  has a past medical history of Allergy, Depression, Diabetes ([place]) (10/03/2015), HIV disease ([place]), and Immune deficiency disorder ([place]).. Patient presents today for Follow-up (wants to start cortisone injection for swelling under sillicon injections), Hypertension, and Diabetes  Patient states that she had gastric bypass in 08/07/2018. Weight has decreased from 329 to 236 pounds since then. Patient has stopped all medications with the exception of HIV antivirals. She is requesting cortisone injections due to inflammation from black market silicone injections. She states that the area surrounding the hips and buttocks cause extreme sharp pain with and without movement. The skin has also become hardened.   Review of Systems  Constitutional: Negative.   HENT: Negative.   Eyes: Negative.   Respiratory: Negative.   Cardiovascular: Negative.   Gastrointestinal: Negative.   Genitourinary: Negative.   Musculoskeletal: Negative.   Skin: Negative.        Hardened skin   Neurological: Negative.   Psychiatric/Behavioral: Negative.      OBJECTIVE: BP 114/83 (BP Location: Left Arm, Patient Position: Sitting, Cuff Size: Large)   Pulse 92   Temp 97.8 F (36.6 C) (Oral)   Resp 16   Ht 5\' 9"  (1.753 m)   Wt 236 lb (107 kg)   SpO2 98%   BMI 34.85 kg/m   Wt Readings from Last 3 Encounters:  11/24/18 236 lb (107 kg)  10/12/18 248 lb (112.5 kg)  03/15/18 (!) 329 lb (149.2 kg)     Physical Exam Vitals signs and nursing note reviewed.  Constitutional:      General: She is not in acute distress.    Appearance: Normal appearance.  HENT:     Head: Normocephalic and atraumatic.  Eyes:     Extraocular Movements: Extraocular movements intact.     Conjunctiva/sclera: Conjunctivae normal.     Pupils: Pupils are equal,  round, and reactive to light.  Cardiovascular:     Rate and Rhythm: Normal rate and regular rhythm.     Heart sounds: No murmur.  Pulmonary:     Effort: Pulmonary effort is normal.     Breath sounds: Normal breath sounds.  Musculoskeletal: Normal range of motion.  Skin:    General: Skin is warm and dry.  Neurological:     Mental Status: She is alert and oriented to person, place, and time.  Psychiatric:        Mood and Affect: Mood normal.        Behavior: Behavior normal.        Thought Content: Thought content normal.        Judgment: Judgment normal.         ASSESSMENT/PLAN:   1. Essential hypertension - Urinalysis Dipstick  2. Type 2 diabetes mellitus with complication, without long-term current use of insulin (HCC) - Urinalysis Dipstick - Microalbumin/Creatinine Ratio, Urine - HgB A1c  3. Skin disorder - Ambulatory referral to Plastic Surgery    Return in about 6 months (around 05/27/2019) for DM.    The patient was given clear instructions to go to ER or return to medical center if symptoms do not improve, worsen or new problems develop. The patient verbalized understanding and agreed with plan of care.   Ms. [name], FNP-BC Patient [facility] [address] [place], [place] [address] [phone]

## 2018-11-24 NOTE — Patient Instructions (Signed)
Diabetes Mellitus and Standards of Medical Care Managing diabetes (diabetes mellitus) can be complicated. Your diabetes treatment may be managed by a team of health care providers, including:  A physician who specializes in diabetes (endocrinologist).  A nurse practitioner or physician assistant.  Nurses.  A diet and nutrition specialist (registered dietitian).  A certified diabetes educator (CDE).  An exercise specialist.  A pharmacist.  An eye doctor.  A foot specialist (podiatrist).  A dentist.  A primary care provider.  A mental health provider. Your health care providers follow guidelines to help you get the best quality of care. The following schedule is a general guideline for your diabetes management plan. Your health care providers may give you more specific instructions. Physical exams Upon being diagnosed with diabetes mellitus, and each year after that, your health care provider will ask about your medical and family history. He or she will also do a physical exam. Your exam may include:  Measuring your height, weight, and body mass index (BMI).  Checking your blood pressure. This will be done at every routine medical visit. Your target blood pressure may vary depending on your medical conditions, your age, and other factors.  Thyroid gland exam.  Skin exam.  Screening for damage to your nerves (peripheral neuropathy). This may include checking the pulse in your legs and feet and checking the level of sensation in your hands and feet.  A complete foot exam to inspect the structure and skin of your feet, including checking for cuts, bruises, redness, blisters, sores, or other problems.  Screening for blood vessel (vascular) problems, which may include checking the pulse in your legs and feet and checking your temperature. Blood tests Depending on your treatment plan and your personal needs, you may have the following tests done:  HbA1c (hemoglobin A1c). This  test provides information about blood sugar (glucose) control over the previous 2-3 months. It is used to adjust your treatment plan, if needed. This test will be done: ? At least 2 times a year, if you are meeting your treatment goals. ? 4 times a year, if you are not meeting your treatment goals or if treatment goals have changed.  Lipid testing, including total, LDL, and HDL cholesterol and triglyceride levels. ? The goal for LDL is less than 100 mg/dL (5.5 mmol/L). If you are at high risk for complications, the goal is less than 70 mg/dL (3.9 mmol/L). ? The goal for HDL is 40 mg/dL (2.2 mmol/L) or higher for men and 50 mg/dL (2.8 mmol/L) or higher for women. An HDL cholesterol of 60 mg/dL (3.3 mmol/L) or higher gives some protection against heart disease. ? The goal for triglycerides is less than 150 mg/dL (8.3 mmol/L).  Liver function tests.  Kidney function tests.  Thyroid function tests. Dental and eye exams  Visit your dentist two times a year.  If you have type 1 diabetes, your health care provider may recommend an eye exam 3-5 years after you are diagnosed, and then once a year after your first exam. ? For children with type 1 diabetes, a health care provider may recommend an eye exam when your child is age 10 or older and has had diabetes for 3-5 years. After the first exam, your child should get an eye exam once a year.  If you have type 2 diabetes, your health care provider may recommend an eye exam as soon as you are diagnosed, and then once a year after your first exam. Immunizations   The  yearly flu (influenza) vaccine is recommended for everyone 6 months or older who has diabetes.  The pneumonia (pneumococcal) vaccine is recommended for everyone 2 years or older who has diabetes. If you are 65 or older, you may get the pneumonia vaccine as a series of two separate shots.  The hepatitis B vaccine is recommended for adults shortly after being diagnosed with diabetes.   Adults and children with diabetes should receive all other vaccines according to age-specific recommendations from the Centers for Disease Control and Prevention (CDC). Mental and emotional health Screening for symptoms of eating disorders, anxiety, and depression is recommended at the time of diagnosis and afterward as needed. If your screening shows that you have symptoms (positive screening result), you may need more evaluation and you may work with a mental health care provider. Treatment plan Your treatment plan will be reviewed at every medical visit. You and your health care provider will discuss:  How you are taking your medicines, including insulin.  Any side effects you are experiencing.  Your blood glucose target goals.  The frequency of your blood glucose monitoring.  Lifestyle habits, such as activity level as well as tobacco, alcohol, and substance use. Diabetes self-management education Your health care provider will assess how well you are monitoring your blood glucose levels and whether you are taking your insulin correctly. He or she may refer you to:  A certified diabetes educator to manage your diabetes throughout your life, starting at diagnosis.  A registered dietitian who can create or review your personal nutrition plan.  An exercise specialist who can discuss your activity level and exercise plan. Summary  Managing diabetes (diabetes mellitus) can be complicated. Your diabetes treatment may be managed by a team of health care providers.  Your health care providers follow guidelines in order to help you get the best quality of care.  Standards of care including having regular physical exams, blood tests, blood pressure monitoring, immunizations, screening tests, and education about how to manage your diabetes.  Your health care providers may also give you more specific instructions based on your individual health. This information is not intended to replace  advice given to you by your health care provider. Make sure you discuss any questions you have with your health care provider. Document Released: 01/10/2009 Document Revised: 12/02/2017 Document Reviewed: 12/12/2015 Elsevier Patient Education  2020 Elsevier Inc.  

## 2018-11-26 LAB — MICROALBUMIN / CREATININE URINE RATIO
Creatinine, Urine: 543.4 mg/dL
Microalb/Creat Ratio: 3 mg/g creat (ref 0–29)
Microalbumin, Urine: 16.2 ug/mL

## 2018-11-29 ENCOUNTER — Encounter: Payer: Self-pay | Admitting: Family Medicine

## 2018-12-06 ENCOUNTER — Encounter (HOSPITAL_COMMUNITY): Payer: Self-pay | Admitting: *Deleted

## 2018-12-06 ENCOUNTER — Encounter (HOSPITAL_COMMUNITY): Payer: Self-pay

## 2019-02-05 ENCOUNTER — Other Ambulatory Visit: Payer: Self-pay | Admitting: Family Medicine

## 2019-02-05 DIAGNOSIS — E118 Type 2 diabetes mellitus with unspecified complications: Secondary | ICD-10-CM

## 2019-04-16 ENCOUNTER — Telehealth: Payer: Self-pay

## 2019-04-16 NOTE — Telephone Encounter (Signed)
COVID-19 Pre-Screening Questions:04/16/19  Do you currently have a fever (>100 F), chills or unexplained body aches? NO   . Are you currently experiencing new cough, shortness of breath, sore throat, runny nose? NO .  Have you recently travelled outside the state of [place] in the last 14 days? NO .  Have you been in contact with someone that is currently pending confirmation of Covid19 testing or has been confirmed to have the Covid19 virus?  NO  **If the patient answers NO to ALL questions -  advise the patient to please call the clinic before coming to the office should any symptoms develop.     

## 2019-04-17 ENCOUNTER — Ambulatory Visit (INDEPENDENT_AMBULATORY_CARE_PROVIDER_SITE_OTHER): Payer: 59 | Admitting: Internal Medicine

## 2019-04-17 ENCOUNTER — Other Ambulatory Visit (HOSPITAL_COMMUNITY)
Admission: RE | Admit: 2019-04-17 | Discharge: 2019-04-17 | Disposition: A | Discharge status: Home / Self Care | Payer: 59 | Source: Ambulatory Visit | Attending: Internal Medicine | Admitting: Internal Medicine

## 2019-04-17 ENCOUNTER — Encounter: Payer: Self-pay | Admitting: Internal Medicine

## 2019-04-17 ENCOUNTER — Other Ambulatory Visit: Payer: Self-pay

## 2019-04-17 DIAGNOSIS — R350 Frequency of micturition: Secondary | ICD-10-CM

## 2019-04-17 DIAGNOSIS — B20 Human immunodeficiency virus [HIV] disease: Secondary | ICD-10-CM

## 2019-04-17 NOTE — Assessment & Plan Note (Signed)
I am not sure what is causing her month-long been with urinary frequency and hesitancy.  Plan to check a urinalysis, GC and chlamydia screen and culture.

## 2019-04-17 NOTE — Assessment & Plan Note (Signed)
Her adherence is excellent and her infection has been under good long-term control.  She will continue Biktarvy, get blood work today and follow-up in 6 months.

## 2019-04-17 NOTE — Progress Notes (Signed)
Patient Active Problem List   Diagnosis Date Noted  . Sarcoidosis of lung ([place]) 01/31/2014    Priority: High  . Human immunodeficiency virus (HIV) disease ([place]) 04/26/2006    Priority: High  . Depression 04/26/2006    Priority: Medium  . Urinary frequency 04/17/2019  . 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 Hip pain, bilateral 04/27/2016  . Urine leukocytes 04/27/2016  . Weight gain 04/27/2016  . Urinary hesitancy 04/27/2016  . Liver mass 04/05/2016  . Abdominal pain 03/09/2016  . 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 GERD (gastroesophageal reflux disease) 08/15/2014  . Pulmonary infiltrates 07/25/2014  . Bronchitis, chronic obstructive ([place]) 07/16/2014  . SOB (shortness of breath) 02/04/2014  . Syncope 02/04/2014  . Immunization due 01/31/2014  . Tachycardia 01/31/2014  . Generalized swelling, mass, or lump of abdomen or pelvis 09/21/2013  . Other allergic rhinitis 09/21/2013  . Essential hypertension 09/21/2013  . Preventative health care 09/21/2013  . Wheezing 08/07/2013  . Peripheral neuropathy 08/31/2010  . MOLE 10/14/2009  . SKIN RASH 10/14/2009  . MORBID OBESITY 04/22/2009  . 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 ALLERGIC RHINITIS 04/26/2006  . Hilar adenopathy 04/26/2006    Patient's Medications  New Prescriptions   No medications on file  Previous Medications   BIKTARVY 50-200-25 MG TABS TABLET    TAKE 1 TABLET BY MOUTH DAILY. PLEASE KEEP APPOINTMENT IN JULY   BLOOD GLUCOSE METER KIT AND SUPPLIES KIT    Dispense based on patient and insurance preference. Use up to four times daily as directed. (FOR ICD-9 250.00, 250.01).   CHOLECALCIFEROL 50 MCG (2000 UT) TABS    Take by mouth.   LEVOTHYROXINE (SYNTHROID) 100 MCG TABLET    TAKE 1 TABLET(100 MCG) BY MOUTH DAILY BEFORE BREAKFAST   MULTIPLE VITAMIN (MULTIVITAMIN) TABLET    Take by mouth.   OMEPRAZOLE (PRILOSEC) 40  MG CAPSULE      Modified Medications   No medications on file  Discontinued Medications   No medications on file    Subjective: Robert Dawson is in for her routine HIV follow-up visit.  She has not had any problems obtaining, taking or tolerating her Biktarvy.  She takes it each morning after she gets up.  She does not recall missing any doses.  She has mostly been staying at home or during the Covid pandemic.  She is feeling well except a sensation that she is not completely emptying her bladder.  This has been going on for about 1 month.  She is not having any dysuria.  Review of Systems: Review of Systems  Constitutional: Negative for fever.  HENT: Negative for congestion and sore throat.   Respiratory: Negative for cough.   Cardiovascular: Negative for chest pain.  Gastrointestinal: Positive for abdominal pain. Negative for diarrhea, nausea and vomiting.  Genitourinary: Positive for frequency.    Past Medical History:  Diagnosis Date  . Allergy   . Depression   . Diabetes ([place]) 10/03/2015  . HIV disease ([place])   . Immune deficiency disorder [facility])     Social History   Tobacco Use  . Smoking status: Never Smoker  . Smokeless tobacco: Never Used  Substance Use Topics  . Alcohol use: No    Alcohol/week: 0.0 standard drinks  . Drug use: No    Family History  Problem Relation Age of Onset  . Diabetes Maternal Grandmother   .  Hypertension Maternal Grandmother     Allergies  Allergen Reactions  . Lisinopril Cough    Health Maintenance  Topic Date Due  . OPHTHALMOLOGY EXAM  03/17/2017  . FOOT EXAM  04/27/2017  . HEMOGLOBIN A1C  05/27/2019  . URINE MICROALBUMIN  11/24/2019  . TETANUS/TDAP  02/01/2024  . PNEUMOCOCCAL POLYSACCHARIDE VACCINE AGE 78-64 HIGH RISK  Completed  . HIV Screening  Completed    Objective:  Vitals:   04/17/19 1435  BP: 121/86  Pulse: 79  Weight: 208 lb (94.3 kg)   Body mass index is 30.72 kg/m.  Physical Exam Constitutional:       Comments: She has continued to lose weight following gastric bypass surgery last May.  Her goal weight is 185 pounds.  Cardiovascular:     Rate and Rhythm: Normal rate and regular rhythm.     Heart sounds: No murmur.  Pulmonary:     Effort: Pulmonary effort is normal.     Breath sounds: Normal breath sounds.  Abdominal:     Palpations: Abdomen is soft.     Comments: She has some mild right upper quadrant discomfort with palpation.  She has no suprapubic or CVA tenderness.  Skin:    Findings: No rash.  Psychiatric:        Mood and Affect: Mood normal.     Lab Results Lab Results  Component Value Date   WBC 5.5 10/12/2018   HGB 14.5 10/12/2018   HCT 42.1 10/12/2018   MCV 88.8 10/12/2018   PLT 302 10/12/2018    Lab Results  Component Value Date   CREATININE 1.13 10/12/2018   BUN 6 (L) 10/12/2018   NA 139 10/12/2018   K 4.2 10/12/2018   CL 104 10/12/2018   CO2 26 10/12/2018    Lab Results  Component Value Date   ALT 8 (L) 10/12/2018   AST 33 10/12/2018   ALKPHOS 93 02/09/2018   BILITOT 0.4 10/12/2018    Lab Results  Component Value Date   CHOL 106 10/12/2018   HDL 33 (L) 10/12/2018   LDLCALC 57 10/12/2018   TRIG 84 10/12/2018   CHOLHDL 3.2 10/12/2018   Lab Results  Component Value Date   LABRPR REACTIVE (A) 10/12/2018   RPRTITER 1:2 (H) 10/12/2018   HIV 1 RNA Quant (copies/mL)  Date Value  10/12/2018 <20 NOT DETECTED  03/15/2018 38 (H)  08/30/2017 <20 NOT DETECTED   CD4 T Cell Abs (/uL)  Date Value  10/12/2018 700  03/15/2018 360 (L)  08/30/2017 630     Problem List Items Addressed This Visit      High   Human immunodeficiency virus (HIV) disease ([place])    Her adherence is excellent and her infection has been under good long-term control.  She will continue Biktarvy, get blood work today and follow-up in 6 months.      Relevant Orders   T-helper cell (CD4)- ([facility] clinic only)   HIV-1 RNA quant-no reflex-bld   CBC   Comprehensive metabolic  panel   RPR   Urine cytology ancillary only     Unprioritized   Urinary frequency    I am not sure what is causing her month-long been with urinary frequency and hesitancy.  Plan to check a urinalysis, GC and chlamydia screen and culture.      Relevant Orders   Urinalysis, Routine w reflex microscopic   Urine Culture        Michel Bickers, MD [place] for [facility] [phone] pager   [phone] cell 04/17/2019, 3:05 PM

## 2019-04-18 LAB — URINALYSIS, ROUTINE W REFLEX MICROSCOPIC
Bacteria, UA: NONE SEEN /HPF
Bilirubin Urine: NEGATIVE
Glucose, UA: NEGATIVE
Hgb urine dipstick: NEGATIVE
Hyaline Cast: NONE SEEN /LPF
Ketones, ur: NEGATIVE
Nitrite: NEGATIVE
Protein, ur: NEGATIVE
Specific Gravity, Urine: 1.024 (ref 1.001–1.03)
Squamous Epithelial / HPF: NONE SEEN /HPF (ref <=5)
pH: 5.5 (ref 5.0–8.0)

## 2019-04-18 LAB — URINE CYTOLOGY ANCILLARY ONLY
Chlamydia: NEGATIVE
Comment: NEGATIVE
Comment: NORMAL
Neisseria Gonorrhea: NEGATIVE

## 2019-04-18 LAB — URINE CULTURE
MICRO NUMBER:: 10057329
Result:: NO GROWTH
SPECIMEN QUALITY:: ADEQUATE

## 2019-04-18 LAB — T-HELPER CELL (CD4) - (RCID CLINIC ONLY)
CD4 % Helper T Cell: 26 % — ABNORMAL LOW (ref 33–65)
CD4 T Cell Abs: 593 /uL (ref 400–1790)

## 2019-04-21 LAB — COMPREHENSIVE METABOLIC PANEL
AG Ratio: 0.9 (calc) — ABNORMAL LOW (ref 1.0–2.5)
ALT: 15 U/L (ref 9–46)
AST: 70 U/L — ABNORMAL HIGH (ref 10–40)
Albumin: 3.2 g/dL — ABNORMAL LOW (ref 3.6–5.1)
Alkaline phosphatase (APISO): 123 U/L (ref 36–130)
BUN: 9 mg/dL (ref 7–25)
CO2: 28 mmol/L (ref 20–32)
Calcium: 8.9 mg/dL (ref 8.6–10.3)
Chloride: 102 mmol/L (ref 98–110)
Creat: 1.34 mg/dL (ref 0.60–1.35)
Globulin: 3.5 g/dL (calc) (ref 1.9–3.7)
Glucose, Bld: 74 mg/dL (ref 65–99)
Potassium: 3.8 mmol/L (ref 3.5–5.3)
Sodium: 137 mmol/L (ref 135–146)
Total Bilirubin: 0.5 mg/dL (ref 0.2–1.2)
Total Protein: 6.7 g/dL (ref 6.1–8.1)

## 2019-04-21 LAB — FLUORESCENT TREPONEMAL AB(FTA)-IGG-BLD: Fluorescent Treponemal ABS: REACTIVE — AB

## 2019-04-21 LAB — CBC
HCT: 41.8 % (ref 38.5–50.0)
Hemoglobin: 14.2 g/dL (ref 13.2–17.1)
MCH: 31.8 pg (ref 27.0–33.0)
MCHC: 34 g/dL (ref 32.0–36.0)
MCV: 93.5 fL (ref 80.0–100.0)
MPV: 11.5 fL (ref 7.5–12.5)
Platelets: 290 10*3/uL (ref 140–400)
RBC: 4.47 10*6/uL (ref 4.20–5.80)
RDW: 13.1 % (ref 11.0–15.0)
WBC: 4.7 10*3/uL (ref 3.8–10.8)

## 2019-04-21 LAB — RPR TITER: RPR Titer: 1:4 {titer} — ABNORMAL HIGH

## 2019-04-21 LAB — HIV-1 RNA QUANT-NO REFLEX-BLD
HIV 1 RNA Quant: <20 copies/mL
HIV-1 RNA Quant, Log: <1.3 Log copies/mL

## 2019-04-21 LAB — RPR: RPR Ser Ql: REACTIVE — AB

## 2019-04-26 ENCOUNTER — Other Ambulatory Visit: Payer: Self-pay

## 2019-04-26 ENCOUNTER — Other Ambulatory Visit: Payer: Self-pay | Admitting: Internal Medicine

## 2019-04-26 DIAGNOSIS — A528 Late syphilis, latent: Secondary | ICD-10-CM

## 2019-04-26 DIAGNOSIS — B20 Human immunodeficiency virus [HIV] disease: Secondary | ICD-10-CM

## 2019-04-26 MED ORDER — BIKTARVY 50-200-25 MG PO TABS: ORAL_TABLET | ORAL | 5 refills | Status: DC

## 2019-06-01 ENCOUNTER — Ambulatory Visit: Payer: Self-pay | Admitting: Family Medicine

## 2019-07-13 ENCOUNTER — Ambulatory Visit (INDEPENDENT_AMBULATORY_CARE_PROVIDER_SITE_OTHER): Payer: 59 | Admitting: Nurse Practitioner

## 2019-07-13 ENCOUNTER — Encounter: Payer: Self-pay | Admitting: Nurse Practitioner

## 2019-07-13 ENCOUNTER — Other Ambulatory Visit: Payer: Self-pay

## 2019-07-13 VITALS — BP 116/80 | HR 67 | Temp 98.0°F | Ht 69.0 in | Wt 199.0 lb

## 2019-07-13 DIAGNOSIS — B20 Human immunodeficiency virus [HIV] disease: Secondary | ICD-10-CM | POA: Diagnosis not present

## 2019-07-13 DIAGNOSIS — J302 Other seasonal allergic rhinitis: Secondary | ICD-10-CM | POA: Diagnosis not present

## 2019-07-13 DIAGNOSIS — E118 Type 2 diabetes mellitus with unspecified complications: Secondary | ICD-10-CM

## 2019-07-13 DIAGNOSIS — E039 Hypothyroidism, unspecified: Secondary | ICD-10-CM

## 2019-07-13 LAB — POCT URINALYSIS DIPSTICK
Bilirubin, UA: NEGATIVE
Glucose, UA: NEGATIVE
Ketones, UA: NEGATIVE
Leukocytes, UA: NEGATIVE
Nitrite, UA: NEGATIVE
Protein, UA: NEGATIVE
Spec Grav, UA: 1.02 (ref 1.010–1.025)
Urobilinogen, UA: 1 E.U./dL
pH, UA: 6 (ref 5.0–8.0)

## 2019-07-13 LAB — GLUCOSE, POCT (MANUAL RESULT ENTRY): POC Glucose: 88 mg/dl (ref 70–99)

## 2019-07-13 LAB — POCT GLYCOSYLATED HEMOGLOBIN (HGB A1C): Hemoglobin A1C: 4.8 % (ref 4.0–5.6)

## 2019-07-13 MED ORDER — LEVOCETIRIZINE DIHYDROCHLORIDE 5 MG PO TABS: 5.0000 mg | ORAL_TABLET | Freq: Every evening | ORAL | 11 refills | Status: DC

## 2019-07-13 NOTE — Progress Notes (Signed)
[place], [place]  [address] Phone:  [phone]   Fax:  [phone]   Established Patient Office Visit  Subjective:  Patient ID: Robert Dawson, adult    DOB: 11/02/1975  Age: 44 y.o. MRN: [id number]  CC:  Chief Complaint  Patient presents with  . Follow-up    Former Community education officer patient / HTN    HPI Robert Dawson presents for follow up. She  has a past medical history of Allergy, Depression, Diabetes ([place]) (10/03/2015), HIV disease ([place]), and Immune deficiency disorder ([place]).    Hypothyroidism Robert Dawson is a 44 y.o. adult who presents for follow up of hypothyroidism. Current symptoms: change in energy level, heat / cold intolerance, palpitations and weight changes . Patient denies diarrhea and nervousness. Symptoms have gradually worsened. She had gastric bypass 3/20 and admits that her highest weight was 361 pain her current weight is 199.  Her ideal weight is 185.  Allergic Rhinitis Patient presents for evaluation of allergic symptoms.  Symptoms include clear rhinorrhea, itchy eyes, itchy nose, nasal congestion and postnasal drip and are present in a seasonal pattern.  Precipitants include seasonal changes.  Treatment in the past has included oral antihistamines: Over-the-counter; multiple.  Treatment currently includes None and is not effective in the patient's opinion.  Past Medical History:  Diagnosis Date  . Allergy   . Depression   . Diabetes ([place]) 10/03/2015  . HIV disease ([place])   . Immune deficiency disorder [facility])     Past Surgical History:  Procedure Laterality Date  . GASTRIC ROUX-EN-Y    . Silicone injections Bilateral y-18   Breasts and hips- black market  . VIDEO BRONCHOSCOPY Bilateral 08/06/2014   Procedure: VIDEO BRONCHOSCOPY WITH FLUORO;  Surgeon: [name], MD;  Location: [facility] END[facility];  Service: Endoscopy;  Laterality: Bilateral;    Family History  Problem Relation Age of  Onset  . Diabetes Maternal Grandmother   . Hypertension Maternal Grandmother     Social History   Socioeconomic History  . Marital status: Single    Spouse name: Not on file  . Number of children: Not on file  . Years of education: Not on file  . Highest education level: Not on file  Occupational History  . Not on file  Tobacco Use  . Smoking status: Never Smoker  . Smokeless tobacco: Never Used  Substance and Sexual Activity  . Alcohol use: No    Alcohol/week: 0.0 standard drinks  . Drug use: No  . Sexual activity: Not Currently    Partners: Male, Male    Birth control/protection: Condom    Comment: given condoms. Pt is an under.  Other Topics Concern  . Not on file  Social History Narrative  . Not on file   Social Determinants of Health   Financial Resource Strain:   . Difficulty of Paying Living Expenses:   Food Insecurity:   . Worried About Charity fundraiser in the Last Year:   . Arboriculturist in the Last Year:   Transportation Needs:   . Film/video editor (Medical):   Marland Kitchen Lack of Transportation (Non-Medical):   Physical Activity:   . Days of Exercise per Week:   . Minutes of Exercise per Session:   Stress:   . Feeling of Stress :   Social Connections:   . Frequency of Communication with Friends and Family:   . Frequency of Social Gatherings with Friends and  Family:   . Attends Religious Services:   . Active Member of Clubs or Organizations:   . Attends Archivist Meetings:   Marland Kitchen Marital Status:   Intimate Partner Violence:   . Fear of Current or Ex-Partner:   . Emotionally Abused:   Marland Kitchen Physically Abused:   . Sexually Abused:     Outpatient Medications Prior to Visit  Medication Sig Dispense Refill  . bictegravir-emtricitabine-tenofovir AF (BIKTARVY) 50-200-25 MG TABS tablet TAKE 1 TABLET BY MOUTH DAILY. 30 tablet 5  . blood glucose meter kit and supplies KIT Dispense based on patient and insurance preference. Use up to four times  daily as directed. (FOR ICD-9 250.00, 250.01). 1 each 0  . Cholecalciferol 50 MCG (2000 UT) TABS Take by mouth.    . levothyroxine (SYNTHROID) 100 MCG tablet TAKE 1 TABLET(100 MCG) BY MOUTH DAILY BEFORE BREAKFAST    . Multiple Vitamin (MULTIVITAMIN) tablet Take by mouth.    Marland Kitchen omeprazole (PRILOSEC) 40 MG capsule     . ursodiol (ACTIGALL) 300 MG capsule Take 300 mg by mouth 2 (two) times daily.    Marland Kitchen omeprazole (PRILOSEC) 40 MG capsule TAKE 1 CAPSULE(40 MG) BY MOUTH DAILY     No facility-administered medications prior to visit.    Allergies  Allergen Reactions  . Lisinopril Cough    ROS Review of Systems  All other systems reviewed and are negative.     Objective:    Physical Exam  Constitutional: She is oriented to person, place, and time. She appears well-developed and well-nourished.  HENT:  Head: Normocephalic.  Eyes: Pupils are equal, round, and reactive to light.  Cardiovascular: Normal rate, regular rhythm, normal heart sounds and intact distal pulses.  Pulmonary/Chest: Effort normal and breath sounds normal.  Abdominal: Soft.  Musculoskeletal:        General: Normal range of motion.     Cervical back: Normal range of motion and neck supple.  Neurological: She is alert and oriented to person, place, and time.  Skin: Skin is warm and dry.  Psychiatric: She has a normal mood and affect. Her behavior is normal. Judgment and thought content normal.    BP 116/80   Pulse 67   Temp 98 F (36.7 C)   Ht '5\' 9"'  (1.753 m)   Wt 199 lb (90.3 kg)   SpO2 100%   BMI 29.39 kg/m  Wt Readings from Last 3 Encounters:  07/13/19 199 lb (90.3 kg)  04/17/19 208 lb (94.3 kg)  11/24/18 236 lb (107 kg)     Health Maintenance Due  Topic Date Due  . OPHTHALMOLOGY EXAM  03/17/2017  . FOOT EXAM  04/27/2017    There are no preventive care reminders to display for this patient.  Lab Results  Component Value Date   TSH 1.670 02/09/2018   Lab Results  Component Value Date   WBC  4.7 04/17/2019   HGB 14.2 04/17/2019   HCT 41.8 04/17/2019   MCV 93.5 04/17/2019   PLT 290 04/17/2019   Lab Results  Component Value Date   NA 137 04/17/2019   K 3.8 04/17/2019   CO2 28 04/17/2019   GLUCOSE 74 04/17/2019   BUN 9 04/17/2019   CREATININE 1.34 04/17/2019   BILITOT 0.5 04/17/2019   ALKPHOS 93 02/09/2018   AST 70 (H) 04/17/2019   ALT 15 04/17/2019   PROT 6.7 04/17/2019   ALBUMIN 4.2 02/09/2018   CALCIUM 8.9 04/17/2019   ANIONGAP 9 09/25/2015   GFR 78.84 07/17/2015  Lab Results  Component Value Date   CHOL 106 10/12/2018   Lab Results  Component Value Date   HDL 33 (L) 10/12/2018   Lab Results  Component Value Date   LDLCALC 57 10/12/2018   Lab Results  Component Value Date   TRIG 84 10/12/2018   Lab Results  Component Value Date   CHOLHDL 3.2 10/12/2018   Lab Results  Component Value Date   HGBA1C 4.8 07/13/2019      Assessment & Plan:   Problem List Items Addressed This Visit      Unprioritized   Allergic rhinitis   Human immunodeficiency virus (HIV) disease ([employer])    Other Visit Diagnoses    Type 2 diabetes mellitus with complication, without long-term current use of insulin ([place])    -  Primary   Well-managed with diet and exercise we will continue to monitor   Relevant Orders   POCT glycosylated hemoglobin (Hb A1C) (Completed)   POCT urinalysis dipstick (Completed)   POCT glucose (manual entry) (Completed)   Hypothyroidism, unspecified type       Labs pending   Relevant Orders   TSH   T4, free   T3   CBC with Differential/Platelet      Meds ordered this encounter  Medications  . levocetirizine (XYZAL) 5 MG tablet    Sig: Take 1 tablet (5 mg total) by mouth every evening.    Dispense:  30 tablet    Refill:  11    Order Specific Question:   Supervising Provider    Answer:   Tresa Garter [[id number]]    Follow-up: Return in about 3 months (around 10/12/2019).    Vevelyn Francois, NP

## 2019-07-13 NOTE — Patient Instructions (Signed)
Hypothyroidism  Hypothyroidism is when the thyroid gland does not make enough of certain hormones (it is underactive). The thyroid gland is a small gland located in the lower front part of the neck, just in front of the windpipe (trachea). This gland makes hormones that help control how the body uses food for energy (metabolism) as well as how the heart and brain function. These hormones also play a role in keeping your bones strong. When the thyroid is underactive, it produces too little of the hormones thyroxine (T4) and triiodothyronine (T3). What are the causes? This condition may be caused by:  Hashimoto's disease. This is a disease in which the body's disease-fighting system (immune system) attacks the thyroid gland. This is the most common cause.  Viral infections.  Pregnancy.  Certain medicines.  Birth defects.  Past radiation treatments to the head or neck for cancer.  Past treatment with radioactive iodine.  Past exposure to radiation in the environment.  Past surgical removal of part or all of the thyroid.  Problems with a gland in the center of the brain (pituitary gland).  Lack of enough iodine in the diet. What increases the risk? You are more likely to develop this condition if:  You are male.  You have a family history of thyroid conditions.  You use a medicine called lithium.  You take medicines that affect the immune system (immunosuppressants). What are the signs or symptoms? Symptoms of this condition include:  Feeling as though you have no energy (lethargy).  Not being able to tolerate cold.  Weight gain that is not explained by a change in diet or exercise habits.  Lack of appetite.  Dry skin.  Coarse hair.  Menstrual irregularity.  Slowing of thought processes.  Constipation.  Sadness or depression. How is this diagnosed? This condition may be diagnosed based on:  Your symptoms, your medical history, and a physical exam.  Blood  tests. You may also have imaging tests, such as an ultrasound or MRI. How is this treated? This condition is treated with medicine that replaces the thyroid hormones that your body does not make. After you begin treatment, it may take several weeks for symptoms to go away. Follow these instructions at home:  Take over-the-counter and prescription medicines only as told by your health care provider.  If you start taking any new medicines, tell your health care provider.  Keep all follow-up visits as told by your health care provider. This is important. ? As your condition improves, your dosage of thyroid hormone medicine may change. ? You will need to have blood tests regularly so that your health care provider can monitor your condition. Contact a health care provider if:  Your symptoms do not get better with treatment.  You are taking thyroid replacement medicine and you: ? Sweat a lot. ? Have tremors. ? Feel anxious. ? Lose weight rapidly. ? Cannot tolerate heat. ? Have emotional swings. ? Have diarrhea. ? Feel weak. Get help right away if you have:  Chest pain.  An irregular heartbeat.  A rapid heartbeat.  Difficulty breathing. Summary  Hypothyroidism is when the thyroid gland does not make enough of certain hormones (it is underactive).  When the thyroid is underactive, it produces too little of the hormones thyroxine (T4) and triiodothyronine (T3).  The most common cause is Hashimoto's disease, a disease in which the body's disease-fighting system (immune system) attacks the thyroid gland. The condition can also be caused by viral infections, medicine, pregnancy, or past   radiation treatment to the head or neck.  Symptoms may include weight gain, dry skin, constipation, feeling as though you do not have energy, and not being able to tolerate cold.  This condition is treated with medicine to replace the thyroid hormones that your body does not make. This information  is not intended to replace advice given to you by your health care provider. Make sure you discuss any questions you have with your health care provider. Document Revised: 02/25/2017 Document Reviewed: 02/23/2017 Elsevier Patient Education  2020 [employer]. Allergic Rhinitis, Adult Allergic rhinitis is a reaction to allergens in the air. Allergens are tiny specks (particles) in the air that cause your body to have an allergic reaction. This condition cannot be passed from person to person (is not contagious). Allergic rhinitis cannot be cured, but it can be controlled. There are two types of allergic rhinitis:  Seasonal. This type is also called hay fever. It happens only during certain times of the year.  Perennial. This type can happen at any time of the year. What are the causes? This condition may be caused by:  Pollen from grasses, trees, and weeds.  House dust mites.  Pet dander.  Mold. What are the signs or symptoms? Symptoms of this condition include:  Sneezing.  Runny or stuffy nose (nasal congestion).  A lot of mucus in the back of the throat (postnasal drip).  Itchy nose.  Tearing of the eyes.  Trouble sleeping.  Being sleepy during day. How is this treated? There is no cure for this condition. You should avoid things that trigger your symptoms (allergens). Treatment can help to relieve symptoms. This may include:  Medicines that block allergy symptoms, such as antihistamines. These may be given as a shot, nasal spray, or pill.  Shots that are given until your body becomes less sensitive to the allergen (desensitization).  Stronger medicines, if all other treatments have not worked. Follow these instructions at home: Avoiding allergens   Find out what you are allergic to. Common allergens include smoke, dust, and pollen.  Avoid them if you can. These are some of the things that you can do to avoid allergens: ? Replace carpet with wood, tile, or vinyl  flooring. Carpet can trap dander and dust. ? Clean any mold found in the home. ? Do not smoke. Do not allow smoking in your home. ? Change your heating and air conditioning filter at least once a month. ? During allergy season:  Keep windows closed as much as you can. If possible, use air conditioning when there is a lot of pollen in the air.  Use a special filter for allergies with your furnace and air conditioner.  Plan outdoor activities when pollen counts are lowest. This is usually during the early morning or evening hours.  If you do go outdoors when pollen count is high, wear a special mask for people with allergies.  When you come indoors, take a shower and change your clothes before sitting on furniture or bedding. General instructions  Do not use fans in your home.  Do not hang clothes outside to dry.  Wear sunglasses to keep pollen out of your eyes.  Wash your hands right away after you touch household pets.  Take over-the-counter and prescription medicines only as told by your doctor.  Keep all follow-up visits as told by your doctor. This is important. Contact a doctor if:  You have a fever.  You have a cough that does not go away (  is persistent).  You start to make whistling sounds when you breathe (wheeze).  Your symptoms do not get better with treatment.  You have thick fluid coming from your nose.  You start to have nosebleeds. Get help right away if:  Your tongue or your lips are swollen.  You have trouble breathing.  You feel dizzy or you feel like you are going to pass out (faint).  You have cold sweats. Summary  Allergic rhinitis is a reaction to allergens in the air.  This condition may be caused by allergens. These include pollen, dust mites, pet dander, and mold.  Symptoms include a runny, itchy nose, sneezing, or tearing eyes. You may also have trouble sleeping or feel sleepy during the day.  Treatment includes taking medicines and  avoiding allergens. You may also get shots or take stronger medicines.  Get help if you have a fever or a cough that does not stop. Get help right away if you are short of breath. This information is not intended to replace advice given to you by your health care provider. Make sure you discuss any questions you have with your health care provider. Document Revised: 07/04/2018 Document Reviewed: 10/04/2017 Elsevier Patient Education  [employer].

## 2019-07-14 LAB — CBC WITH DIFFERENTIAL/PLATELET
Basophils Absolute: 0 10*3/uL (ref 0.0–0.2)
Basos: 0 %
EOS (ABSOLUTE): 0 10*3/uL (ref 0.0–0.4)
Eos: 1 %
Hematocrit: 39.7 % (ref 37.5–51.0)
Hemoglobin: 13.5 g/dL (ref 13.0–17.7)
Immature Grans (Abs): 0 10*3/uL (ref 0.0–0.1)
Immature Granulocytes: 0 %
Lymphocytes Absolute: 2.4 10*3/uL (ref 0.7–3.1)
Lymphs: 53 %
MCH: 31.2 pg (ref 26.6–33.0)
MCHC: 34 g/dL (ref 31.5–35.7)
MCV: 92 fL (ref 79–97)
Monocytes Absolute: 0.5 10*3/uL (ref 0.1–0.9)
Monocytes: 11 %
Neutrophils Absolute: 1.5 10*3/uL (ref 1.4–7.0)
Neutrophils: 35 %
Platelets: 264 10*3/uL (ref 150–450)
RBC: 4.33 x10E6/uL (ref 4.14–5.80)
RDW: 13.3 % (ref 11.6–15.4)
WBC: 4.5 10*3/uL (ref 3.4–10.8)

## 2019-07-14 LAB — T4, FREE: Free T4: 1.13 ng/dL (ref 0.82–1.77)

## 2019-07-14 LAB — T3: T3, Total: 97 ng/dL (ref 71–180)

## 2019-07-14 LAB — TSH: TSH: 3.97 u[IU]/mL (ref 0.450–4.500)

## 2019-10-11 ENCOUNTER — Encounter: Payer: Self-pay | Admitting: Internal Medicine

## 2019-10-11 ENCOUNTER — Ambulatory Visit (INDEPENDENT_AMBULATORY_CARE_PROVIDER_SITE_OTHER): Payer: 59 | Admitting: Internal Medicine

## 2019-10-11 ENCOUNTER — Other Ambulatory Visit: Payer: Self-pay

## 2019-10-11 DIAGNOSIS — F330 Major depressive disorder, recurrent, mild: Secondary | ICD-10-CM

## 2019-10-11 DIAGNOSIS — B20 Human immunodeficiency virus [HIV] disease: Secondary | ICD-10-CM | POA: Diagnosis not present

## 2019-10-11 DIAGNOSIS — A528 Late syphilis, latent: Secondary | ICD-10-CM

## 2019-10-11 DIAGNOSIS — Z9884 Bariatric surgery status: Secondary | ICD-10-CM

## 2019-10-11 NOTE — Assessment & Plan Note (Signed)
She has had tremendous weight loss and is much healthier now following gastric bypass.

## 2019-10-11 NOTE — Progress Notes (Signed)
Patient Active Problem List   Diagnosis Date Noted  . Sarcoidosis of lung ([place]) 01/31/2014    Priority: High  . Human immunodeficiency virus (HIV) disease ([place]) 04/26/2006    Priority: High  . Depression 04/26/2006    Priority: Medium  . Urinary frequency 04/17/2019  . 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 Hip pain, bilateral 04/27/2016  . Urine leukocytes 04/27/2016  . Weight gain 04/27/2016  . Urinary hesitancy 04/27/2016  . Liver mass 04/05/2016  . Abdominal pain 03/09/2016  . 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 GERD (gastroesophageal reflux disease) 08/15/2014  . Pulmonary infiltrates 07/25/2014  . Bronchitis, chronic obstructive ([place]) 07/16/2014  . SOB (shortness of breath) 02/04/2014  . Syncope 02/04/2014  . Immunization due 01/31/2014  . Tachycardia 01/31/2014  . Generalized swelling, mass, or lump of abdomen or pelvis 09/21/2013  . Other allergic rhinitis 09/21/2013  . Essential hypertension 09/21/2013  . Preventative health care 09/21/2013  . Wheezing 08/07/2013  . Peripheral neuropathy 08/31/2010  . MOLE 10/14/2009  . SKIN RASH 10/14/2009  . MORBID OBESITY 04/22/2009  . 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 Allergic rhinitis 04/26/2006  . Hilar adenopathy 04/26/2006    Patient's Medications  New Prescriptions   No medications on file  Previous Medications   BICTEGRAVIR-EMTRICITABINE-TENOFOVIR AF (BIKTARVY) 50-200-25 MG TABS TABLET    TAKE 1 TABLET BY MOUTH DAILY.   BLOOD GLUCOSE METER KIT AND SUPPLIES KIT    Dispense based on patient and insurance preference. Use up to four times daily as directed. (FOR ICD-9 250.00, 250.01).   CHOLECALCIFEROL 50 MCG (2000 UT) TABS    Take by mouth.   LEVOCETIRIZINE (XYZAL) 5 MG TABLET    Take 1 tablet (5 mg total) by mouth every evening.   LEVOTHYROXINE (SYNTHROID) 100 MCG TABLET    TAKE 1 TABLET(100 MCG) BY MOUTH DAILY  BEFORE BREAKFAST   MULTIPLE VITAMIN (MULTIVITAMIN) TABLET    Take by mouth.   OMEPRAZOLE (PRILOSEC) 40 MG CAPSULE       OMEPRAZOLE (PRILOSEC) 40 MG CAPSULE    TAKE 1 CAPSULE(40 MG) BY MOUTH DAILY   URSODIOL (ACTIGALL) 300 MG CAPSULE    Take 300 mg by mouth 2 (two) times daily.  Modified Medications   No medications on file  Discontinued Medications   No medications on file    Subjective: Robert Dawson is in for her routine HIV follow-up visit.  She has not had any problems obtaining, taking or tolerating her Biktarvy and does not recall missing doses.  She is already received her Pfizer Covid vaccine without difficulty.  She had gastric bypass surgery 1 year ago and has lost a great deal of weight.  She feels much better.  She walks each morning.  She says that she has occasional brief periods of feeling down but overall is not struggling with her depression or anxiety.  Review of Systems: Review of Systems  Constitutional: Positive for weight loss. Negative for fever.  Respiratory: Negative for cough and shortness of breath.   Cardiovascular: Negative for chest pain.  Gastrointestinal: Negative for abdominal pain, diarrhea, nausea and vomiting.  Psychiatric/Behavioral: Positive for depression. The patient is not nervous/anxious.     Past Medical History:  Diagnosis Date  . Allergy   . Depression   . Diabetes ([place]) 10/03/2015  . HIV disease ([place])   . Immune deficiency disorder ([place])  Social History   Tobacco Use  . Smoking status: Never Smoker  . Smokeless tobacco: Never Used  Vaping Use  . Vaping Use: Never used  Substance Use Topics  . Alcohol use: No    Alcohol/week: 0.0 standard drinks  . Drug use: No    Family History  Problem Relation Age of Onset  . Diabetes Maternal Grandmother   . Hypertension Maternal Grandmother     Allergies  Allergen Reactions  . Lisinopril Cough    Health Maintenance  Topic Date Due  . COVID-19 Vaccine (1) Never done  . OPHTHALMOLOGY  EXAM  03/17/2017  . FOOT EXAM  04/27/2017  . URINE MICROALBUMIN  11/24/2019  . HEMOGLOBIN A1C  01/12/2020  . TETANUS/TDAP  02/01/2024  . PNEUMOCOCCAL POLYSACCHARIDE VACCINE AGE 86-64 HIGH RISK  Completed  . Hepatitis C Screening  Completed  . HIV Screening  Completed    Objective:  Vitals:   10/11/19 1449  BP: 118/74  Pulse: 68  SpO2: 98%  Weight: 193 lb (87.5 kg)   Body mass index is 28.5 kg/m.  Physical Exam Constitutional:      Comments: She is in very good spirits.  She has lost 145 pounds in the last year and a half.  Cardiovascular:     Rate and Rhythm: Normal rate and regular rhythm.     Heart sounds: No murmur heard.   Pulmonary:     Effort: Pulmonary effort is normal.     Breath sounds: Normal breath sounds.  Abdominal:     Palpations: Abdomen is soft.     Tenderness: There is no abdominal tenderness.  Musculoskeletal:        General: No swelling or tenderness.  Skin:    Findings: No rash.  Neurological:     General: No focal deficit present.  Psychiatric:        Mood and Affect: Mood normal.     Lab Results Lab Results  Component Value Date   WBC 4.5 07/13/2019   HGB 13.5 07/13/2019   HCT 39.7 07/13/2019   MCV 92 07/13/2019   PLT 264 07/13/2019    Lab Results  Component Value Date   CREATININE 1.34 04/17/2019   BUN 9 04/17/2019   NA 137 04/17/2019   K 3.8 04/17/2019   CL 102 04/17/2019   CO2 28 04/17/2019    Lab Results  Component Value Date   ALT 15 04/17/2019   AST 70 (H) 04/17/2019   ALKPHOS 93 02/09/2018   BILITOT 0.5 04/17/2019    Lab Results  Component Value Date   CHOL 106 10/12/2018   HDL 33 (L) 10/12/2018   LDLCALC 57 10/12/2018   TRIG 84 10/12/2018   CHOLHDL 3.2 10/12/2018   Lab Results  Component Value Date   LABRPR REACTIVE (A) 04/17/2019   RPRTITER 1:4 (H) 04/17/2019   HIV 1 RNA Quant (copies/mL)  Date Value  04/17/2019 <20 NOT DETECTED  10/12/2018 <20 NOT DETECTED  03/15/2018 38 (H)   CD4 T Cell Abs (/uL)   Date Value  04/17/2019 593  10/12/2018 700  03/15/2018 360 (L)     Problem List Items Addressed This Visit      High   Human immunodeficiency virus (HIV) disease ([place])    Her infection has been under very good long-term control.  She will continue Biktarvy, get blood work today and follow-up in 1 year.       Relevant Orders   T-helper cell (CD4)- ([facility] clinic only)   HIV-1  RNA quant-no reflex-bld     Medium   Depression    Her depression is much improved.        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  I do not feel she needs retreatment now.      Status post gastric bypass for obesity    She has had tremendous weight loss and is much healthier now following gastric bypass.           Michel Bickers, MD [facility] [phone] pager   [phone] cell 10/11/2019, 3:22 PM

## 2019-10-11 NOTE — Assessment & Plan Note (Signed)
Her depression is much improved.

## 2019-10-11 NOTE — Assessment & Plan Note (Signed)
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  I do not feel she needs retreatment now.

## 2019-10-11 NOTE — Assessment & Plan Note (Signed)
Her infection has been under very good long-term control.  She will continue Biktarvy, get blood work today and follow-up in 1 year.

## 2019-10-12 ENCOUNTER — Ambulatory Visit: Payer: 59 | Admitting: Nurse Practitioner

## 2019-10-12 LAB — T-HELPER CELL (CD4) - (RCID CLINIC ONLY)
CD4 % Helper T Cell: 28 % — ABNORMAL LOW (ref 33–65)
CD4 T Cell Abs: 754 /uL (ref 400–1790)

## 2019-10-13 LAB — HIV-1 RNA QUANT-NO REFLEX-BLD
HIV 1 RNA Quant: <20 copies/mL — AB
HIV-1 RNA Quant, Log: <1.3 Log copies/mL — AB

## 2019-10-25 ENCOUNTER — Other Ambulatory Visit: Payer: Self-pay | Admitting: Internal Medicine

## 2019-10-25 DIAGNOSIS — B20 Human immunodeficiency virus [HIV] disease: Secondary | ICD-10-CM

## 2019-11-15 ENCOUNTER — Ambulatory Visit: Payer: 59 | Admitting: Nurse Practitioner

## 2020-04-22 ENCOUNTER — Other Ambulatory Visit: Payer: Self-pay | Admitting: Internal Medicine

## 2020-04-22 DIAGNOSIS — B20 Human immunodeficiency virus [HIV] disease: Secondary | ICD-10-CM

## 2020-10-11 ENCOUNTER — Other Ambulatory Visit: Payer: Self-pay | Admitting: Internal Medicine

## 2020-10-11 DIAGNOSIS — B20 Human immunodeficiency virus [HIV] disease: Secondary | ICD-10-CM

## 2020-10-13 NOTE — Telephone Encounter (Signed)
Patient scheduled for follow up tomorrow 7/19

## 2020-10-14 ENCOUNTER — Ambulatory Visit: Payer: 59 | Admitting: Internal Medicine

## 2020-10-15 NOTE — Telephone Encounter (Signed)
Patient missed appt yesterday. I have left a message for patient to call back to reschedule

## 2020-10-16 ENCOUNTER — Encounter: Payer: Self-pay | Admitting: Internal Medicine

## 2020-10-16 ENCOUNTER — Ambulatory Visit (INDEPENDENT_AMBULATORY_CARE_PROVIDER_SITE_OTHER): Payer: 59 | Admitting: Internal Medicine

## 2020-10-16 ENCOUNTER — Other Ambulatory Visit: Payer: Self-pay

## 2020-10-16 DIAGNOSIS — A528 Late syphilis, latent: Secondary | ICD-10-CM

## 2020-10-16 DIAGNOSIS — F3342 Major depressive disorder, recurrent, in full remission: Secondary | ICD-10-CM | POA: Diagnosis not present

## 2020-10-16 DIAGNOSIS — B20 Human immunodeficiency virus [HIV] disease: Secondary | ICD-10-CM | POA: Diagnosis not present

## 2020-10-16 MED ORDER — BIKTARVY 50-200-25 MG PO TABS: 1.0000 | ORAL_TABLET | Freq: Every day | ORAL | 11 refills | Status: DC

## 2020-10-16 NOTE — Assessment & Plan Note (Signed)
Her depression is in remission. 

## 2020-10-16 NOTE — Assessment & Plan Note (Signed)
Her RPR is serofast at 1:4.  I do not feel she needs retreatment for syphilis.

## 2020-10-16 NOTE — Assessment & Plan Note (Signed)
Her adherence is perfect and her infection has been under excellent, long-term control.  She will get repeat lab work today, continue Airline pilot in 1 year.  I recommended that she go ahead and get her COVID booster vaccine.

## 2020-10-16 NOTE — Progress Notes (Signed)
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   GERD (gastroesophageal reflux disease) 08/15/2014   Pulmonary infiltrates 07/25/2014   Bronchitis, chronic obstructive ([place]) 07/16/2014   SOB (shortness of breath) 02/04/2014   Syncope 02/04/2014   Immunization due 01/31/2014   Tachycardia 01/31/2014   Generalized swelling, mass, or lump of abdomen or pelvis 09/21/2013   Other allergic rhinitis 09/21/2013   Essential hypertension 09/21/2013   Preventative health care 09/21/2013   Wheezing 08/07/2013   Peripheral neuropathy 08/31/2010   MOLE 10/14/2009   SKIN RASH 10/14/2009   MORBID OBESITY 04/22/2009   DISEASES OF LIPS 08/27/2008   SYPHILIS, LATE, 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 Use up to four times daily as directed. (FOR ICD-9 250.00, 250.01).   CHOLECALCIFEROL 50 MCG (2000 UT) TABS    Take by mouth.   LEVOCETIRIZINE (XYZAL) 5 MG TABLET    Take 1 tablet (5 mg total) by mouth every evening.   LEVOTHYROXINE (SYNTHROID) 100 MCG TABLET    TAKE 1 TABLET(100 MCG) BY MOUTH DAILY BEFORE BREAKFAST   MULTIPLE VITAMIN (MULTIVITAMIN) TABLET    Take by mouth.   OMEPRAZOLE (PRILOSEC) 40 MG CAPSULE       OMEPRAZOLE (PRILOSEC) 40  MG CAPSULE    TAKE 1 CAPSULE(40 MG) BY MOUTH DAILY   URSODIOL (ACTIGALL) 300 MG CAPSULE    Take 300 mg by mouth 2 (two) times daily.  Modified Medications   Modified Medication Previous Medication   BICTEGRAVIR-EMTRICITABINE-TENOFOVIR AF (BIKTARVY) 50-200-25 MG TABS TABLET BIKTARVY 50-200-25 MG TABS tablet      Take 1 tablet by mouth daily.    TAKE 1 TABLET BY MOUTH DAILY  Discontinued Medications   No medications on file    Subjective: Robert Dawson is in for her routine HIV follow-up visit.  She denies having any problems with, taking or tolerating her Biktarvy.  She does not recall missing any doses.  She denies feeling anxious or depressed.  She received her first 2 COVID vaccines last year but has not had a booster.  Review of Systems: Review of Systems  Constitutional:  Positive for weight loss. Negative for fever.  Respiratory:  Negative for cough and shortness of breath.   Cardiovascular:  Negative for chest pain.  Psychiatric/Behavioral:  Negative for depression. The patient is not nervous/anxious.    Past Medical History:  Diagnosis Date   Allergy    Depression    Diabetes ([place]) 10/03/2015   HIV disease ([place])    Immune deficiency disorder ([place])     Social History   Tobacco Use   Smoking status: Never   Smokeless tobacco: Never  Vaping Use   Vaping Use: Never used  Substance Use Topics   Alcohol use: No    Alcohol/week: 0.0 standard drinks   Drug use: No    Family History  Problem Relation Age of Onset   Diabetes Maternal Grandmother    Hypertension Maternal Grandmother     Allergies  Allergen Reactions   Lisinopril Cough    Health Maintenance  Topic Date Due   COVID-19 Vaccine (1) Never done   Pneumococcal Vaccine 9-69 Years old (3 - PCV) 04/22/2010   OPHTHALMOLOGY EXAM  03/17/2017   FOOT EXAM  04/27/2017   COLONOSCOPY (Pts 45-53yr Insurance coverage will need to be confirmed)  Never done   URINE MICROALBUMIN  11/24/2019   HEMOGLOBIN A1C  01/12/2020    TETANUS/TDAP  05/10/2030   PNEUMOCOCCAL POLYSACCHARIDE VACCINE AGE 18-64 HIGH RISK  Completed   Hepatitis C Screening  Completed   HIV Screening  Completed   HPV VACCINES  Aged Out    Objective:  Vitals:   10/16/20 1055  BP: 124/82  Pulse: 80  Temp: 98.1 F (36.7 C)  TempSrc: Oral  Weight: 190 lb (86.2 kg)   Body mass index is 28.06 kg/m.  Physical Exam Constitutional:      Comments: Her spirits are good.  Her weight is down 148 pounds since she had gastric bypass surgery 2 years ago.  Cardiovascular:     Rate and Rhythm: Normal rate and regular rhythm.     Heart sounds: No murmur heard. Pulmonary:     Effort: Pulmonary effort is normal.     Breath sounds: Normal breath sounds.  Abdominal:     Palpations: Abdomen is soft.     Tenderness: There is no abdominal tenderness.  Psychiatric:        Mood and Affect: Mood normal.    Lab Results Lab Results  Component Value Date   WBC 4.5 07/13/2019   HGB 13.5 07/13/2019   HCT 39.7 07/13/2019   MCV 92 07/13/2019   PLT 264 07/13/2019    Lab Results  Component Value Date   CREATININE 1.34 04/17/2019   BUN 9 04/17/2019   NA 137 04/17/2019   K 3.8 04/17/2019   CL 102 04/17/2019   CO2 28 04/17/2019    Lab Results  Component Value Date   ALT 15 04/17/2019   AST 70 (H) 04/17/2019   ALKPHOS 93 02/09/2018   BILITOT 0.5 04/17/2019    Lab Results  Component Value Date   CHOL 106 10/12/2018   HDL 33 (L) 10/12/2018   LDLCALC 57 10/12/2018   TRIG 84 10/12/2018   CHOLHDL 3.2 10/12/2018   Lab Results  Component Value Date   LABRPR REACTIVE (A) 04/17/2019   RPRTITER 1:4 (H) 04/17/2019   HIV 1 RNA Quant (copies/mL)  Date Value  10/11/2019 <20 DETECTED (A)  04/17/2019 <20 NOT DETECTED  10/12/2018 <20 NOT DETECTED   CD4 T Cell Abs (/uL)  Date Value  10/11/2019 754  04/17/2019 593  10/12/2018 700     Problem List Items Addressed This Visit       High   Human immunodeficiency virus (HIV) disease ([place]     Her adherence is perfect and her infection has been under excellent, long-term control.  She will get repeat lab work today, continue BAirline pilotin 1 year.  I recommended that she go ahead and get her COVID booster vaccine.       Relevant Medications   bictegravir-emtricitabine-tenofovir AF (BIKTARVY) 50-200-25 MG TABS tablet   Other Relevant Orders   T-helper cell (  CD4)- ([facility] clinic only)   HIV-1 RNA quant-no reflex-bld   CBC   Comprehensive metabolic panel   RPR   Lipid panel     Medium   Depression    Her depression is in remission.         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  I do not feel she needs retreatment for syphilis.       Relevant Medications   bictegravir-emtricitabine-tenofovir AF (BIKTARVY) 50-200-25 MG TABS tablet      Michel Bickers, MD [facility] for Infectious [place] [phone] pager   [phone] cell 10/16/2020, 11:14 AM

## 2020-10-17 LAB — T-HELPER CELL (CD4) - (RCID CLINIC ONLY)
CD4 % Helper T Cell: 25 % — ABNORMAL LOW (ref 33–65)
CD4 T Cell Abs: 560 /uL (ref 400–1790)

## 2020-10-20 LAB — COMPREHENSIVE METABOLIC PANEL
AG Ratio: 0.9 (calc) — ABNORMAL LOW (ref 1.0–2.5)
ALT: 18 U/L (ref 9–46)
AST: 78 U/L — ABNORMAL HIGH (ref 10–40)
Albumin: 3.2 g/dL — ABNORMAL LOW (ref 3.6–5.1)
Alkaline phosphatase (APISO): 95 U/L (ref 36–130)
BUN: 10 mg/dL (ref 7–25)
CO2: 31 mmol/L (ref 20–32)
Calcium: 8.8 mg/dL (ref 8.6–10.3)
Chloride: 104 mmol/L (ref 98–110)
Creat: 1.25 mg/dL (ref 0.60–1.29)
Globulin: 3.5 g/dL (calc) (ref 1.9–3.7)
Glucose, Bld: 78 mg/dL (ref 65–99)
Potassium: 4.6 mmol/L (ref 3.5–5.3)
Sodium: 139 mmol/L (ref 135–146)
Total Bilirubin: 0.5 mg/dL (ref 0.2–1.2)
Total Protein: 6.7 g/dL (ref 6.1–8.1)

## 2020-10-20 LAB — FLUORESCENT TREPONEMAL AB(FTA)-IGG-BLD: Fluorescent Treponemal ABS: REACTIVE — AB

## 2020-10-20 LAB — RPR: RPR Ser Ql: REACTIVE — AB

## 2020-10-20 LAB — CBC
HCT: 39.7 % (ref 38.5–50.0)
Hemoglobin: 13.8 g/dL (ref 13.2–17.1)
MCH: 32.1 pg (ref 27.0–33.0)
MCHC: 34.8 g/dL (ref 32.0–36.0)
MCV: 92.3 fL (ref 80.0–100.0)
MPV: 10.8 fL (ref 7.5–12.5)
Platelets: 272 10*3/uL (ref 140–400)
RBC: 4.3 10*6/uL (ref 4.20–5.80)
RDW: 13.7 % (ref 11.0–15.0)
WBC: 6.5 10*3/uL (ref 3.8–10.8)

## 2020-10-20 LAB — HIV-1 RNA QUANT-NO REFLEX-BLD
HIV 1 RNA Quant: NOT DETECTED Copies/mL
HIV-1 RNA Quant, Log: NOT DETECTED Log cps/mL

## 2020-10-20 LAB — LIPID PANEL
Cholesterol: 110 mg/dL (ref <200)
HDL: 29 mg/dL — ABNORMAL LOW (ref >=40)
LDL Cholesterol (Calc): 66 mg/dL (calc)
Non-HDL Cholesterol (Calc): 81 mg/dL (calc) (ref <130)
Total CHOL/HDL Ratio: 3.8 (calc) (ref <5.0)
Triglycerides: 71 mg/dL (ref <150)

## 2020-10-20 LAB — RPR TITER: RPR Titer: 1:2 {titer} — ABNORMAL HIGH

## 2021-06-11 ENCOUNTER — Other Ambulatory Visit: Payer: Self-pay | Admitting: Nurse Practitioner

## 2021-06-11 ENCOUNTER — Telehealth: Payer: Self-pay

## 2021-06-11 MED ORDER — OMEPRAZOLE 40 MG PO CPDR: 40.0000 mg | DELAYED_RELEASE_CAPSULE | Freq: Every day | ORAL | 0 refills | Status: DC

## 2021-06-11 NOTE — Telephone Encounter (Signed)
Acid reflux reflux ?

## 2021-06-11 NOTE — Telephone Encounter (Signed)
The refill has been sent. Thanks   ?

## 2021-06-12 ENCOUNTER — Ambulatory Visit: Payer: Self-pay | Admitting: Nurse Practitioner

## 2021-06-17 ENCOUNTER — Other Ambulatory Visit (HOSPITAL_COMMUNITY)
Admission: RE | Admit: 2021-06-17 | Discharge: 2021-06-17 | Disposition: A | Discharge status: Home / Self Care | Payer: Medicare Other | Source: Ambulatory Visit | Attending: Internal Medicine | Admitting: Internal Medicine

## 2021-06-17 ENCOUNTER — Ambulatory Visit (INDEPENDENT_AMBULATORY_CARE_PROVIDER_SITE_OTHER): Payer: Medicare Other | Admitting: Internal Medicine

## 2021-06-17 ENCOUNTER — Ambulatory Visit: Payer: 59 | Admitting: Internal Medicine

## 2021-06-17 ENCOUNTER — Other Ambulatory Visit: Payer: Self-pay

## 2021-06-17 ENCOUNTER — Ambulatory Visit (INDEPENDENT_AMBULATORY_CARE_PROVIDER_SITE_OTHER): Payer: Medicare Other

## 2021-06-17 VITALS — BP 106/72 | HR 95 | Temp 97.9°F | Resp 16 | Ht 69.0 in | Wt 166.0 lb

## 2021-06-17 DIAGNOSIS — K219 Gastro-esophageal reflux disease without esophagitis: Secondary | ICD-10-CM | POA: Diagnosis not present

## 2021-06-17 DIAGNOSIS — Z23 Encounter for immunization: Secondary | ICD-10-CM

## 2021-06-17 DIAGNOSIS — B20 Human immunodeficiency virus [HIV] disease: Secondary | ICD-10-CM | POA: Insufficient documentation

## 2021-06-17 DIAGNOSIS — F3342 Major depressive disorder, recurrent, in full remission: Secondary | ICD-10-CM

## 2021-06-17 MED ORDER — BIKTARVY 50-200-25 MG PO TABS: 1.0000 | ORAL_TABLET | Freq: Every day | ORAL | 11 refills | Status: DC

## 2021-06-17 MED ORDER — PANTOPRAZOLE SODIUM 20 MG PO TBEC: 20.0000 mg | DELAYED_RELEASE_TABLET | Freq: Every day | ORAL | 11 refills | Status: DC

## 2021-06-17 NOTE — Assessment & Plan Note (Signed)
Her infection has been under excellent, long-term control.  She will get repeat lab work today, continue [place] and follow-up by phone in 2 weeks to review her results. ?

## 2021-06-17 NOTE — Assessment & Plan Note (Signed)
Her depression is in remission. 

## 2021-06-17 NOTE — Progress Notes (Signed)
? ?  Covid-19 Vaccination Clinic ? ?Name:  Robert Dawson    ?MRN: [id number] ?DOB: 02/18/1976 ? ?06/17/2021 ? ?[name] was observed post Covid-19 immunization for 15 minutes without incident. She was provided with Vaccine Information Sheet and instruction to access the V-Safe system.  ? ?[name] was instructed to call 911 with any severe reactions post vaccine: ?Difficulty breathing  ?Swelling of face and throat  ?A fast heartbeat  ?A bad rash all over body  ?Dizziness and weakness  ? ? Stephan Minister ? ? ?

## 2021-06-17 NOTE — Assessment & Plan Note (Signed)
I will start her back on Protonix. ?

## 2021-06-17 NOTE — Progress Notes (Signed)
? ?   ? ? ? ? ?Patient Active Problem List  ? Diagnosis Date Noted  ? Sarcoidosis of lung ([place]) 01/31/2014  ?  Priority: High  ? Human immunodeficiency virus (HIV) disease ([place]) 04/26/2006  ?  Priority: High  ? Depression 04/26/2006  ?  Priority: Medium   ? Urinary frequency 04/17/2019  ? 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 Hip pain, bilateral 04/27/2016  ? Urine leukocytes 04/27/2016  ? Weight gain 04/27/2016  ? Urinary hesitancy 04/27/2016  ? Liver mass 04/05/2016  ? Abdominal pain 03/09/2016  ? Hidradenitis axillaris 09/19/2014  ? GERD (gastroesophageal reflux disease) 08/15/2014  ? Pulmonary infiltrates 07/25/2014  ? Bronchitis, chronic obstructive ([place]) 07/16/2014  ? SOB (shortness of breath) 02/04/2014  ? Syncope 02/04/2014  ? Immunization due 01/31/2014  ? Tachycardia 01/31/2014  ? Generalized swelling, mass, or lump of abdomen or pelvis 09/21/2013  ? Other allergic rhinitis 09/21/2013  ? Essential hypertension 09/21/2013  ? Preventative health care 09/21/2013  ? Wheezing 08/07/2013  ? Peripheral neuropathy 08/31/2010  ? MOLE 10/14/2009  ? SKIN RASH 10/14/2009  ? MORBID OBESITY 04/22/2009  ? DISEASES OF LIPS 08/27/2008  ? 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 GENDER IDENTITY DISORDER 04/26/2006  ? Allergic rhinitis 04/26/2006  ? Hilar adenopathy 04/26/2006  ? ? ?Patient's Medications  ?New Prescriptions  ? PANTOPRAZOLE (PROTONIX) 20 MG TABLET    Take 1 tablet (20 mg total) by mouth daily.  ?Previous Medications  ? BLOOD GLUCOSE METER KIT AND SUPPLIES KIT    Dispense based on patient and insurance preference. Use up to four times daily as directed. (FOR ICD-9 250.00, 250.01).  ? CHOLECALCIFEROL 50 MCG (2000 UT) TABS    Take by mouth.  ? LEVOCETIRIZINE (XYZAL) 5 MG TABLET    Take 1 tablet (5 mg total) by mouth every evening.  ? LEVOTHYROXINE (SYNTHROID) 100 MCG TABLET    TAKE 1 TABLET(100 MCG) BY MOUTH DAILY BEFORE BREAKFAST  ? MULTIPLE VITAMIN (MULTIVITAMIN) TABLET    Take by mouth.  ?  OMEPRAZOLE (PRILOSEC) 40 MG CAPSULE    Take 1 capsule (40 mg total) by mouth daily.  ? URSODIOL (ACTIGALL) 300 MG CAPSULE    Take 300 mg by mouth 2 (two) times daily.  ?Modified Medications  ? Modified Medication Previous Medication  ? BICTEGRAVIR-EMTRICITABINE-TENOFOVIR AF (BIKTARVY) 50-200-25 MG TABS TABLET bictegravir-emtricitabine-tenofovir AF (BIKTARVY) 50-200-25 MG TABS tablet  ?    Take 1 tablet by mouth daily.    Take 1 tablet by mouth daily.  ?Discontinued Medications  ? No medications on file  ? ? ?Subjective: ?[name] is in for her routine HIV follow-up visit.  She denies any problems obtaining, taking or tolerating her Biktarvy and does not recall missing doses.  She says that she has not been feeling well for the last month.  She has been bothered by a very dry mouth and remains very thirsty.  She is drinking a lot of water each day.  She is urinating frequently and is worried that her diabetes may have come back.  She says that she is not eating very much.  She gets full very quickly after her gastric bypass surgery a little over a year ago.  She is no longer on anything for acid reflux and is having more problems with indigestion now.  She is currently back in her relationship with her old boyfriend.  She requests to be tested for all sexually transmitted diseases. ? ?Review of Systems: ?Review of Systems  ?  Constitutional:  Negative for fever.  ?Respiratory:  Positive for cough and sputum production. Negative for shortness of breath and wheezing.   ?Cardiovascular:  Negative for chest pain.  ?Gastrointestinal:  Positive for diarrhea and heartburn. Negative for abdominal pain, nausea and vomiting.  ?Genitourinary:  Positive for frequency. Negative for dysuria.  ?Psychiatric/Behavioral:  Negative for depression. The patient is not nervous/anxious.   ? ?Past Medical History:  ?Diagnosis Date  ? Allergy   ? Depression   ? Diabetes ([place]) 10/03/2015  ? HIV disease ([place])   ? Immune deficiency disorder ([place])    ? ? ?Social History  ? ?Tobacco Use  ? Smoking status: Never  ? Smokeless tobacco: Never  ?Vaping Use  ? Vaping Use: Never used  ?Substance Use Topics  ? Alcohol use: No  ?  Alcohol/week: 0.0 standard drinks  ? Drug use: No  ? ? ?Family History  ?Problem Relation Age of Onset  ? Diabetes Maternal Grandmother   ? Hypertension Maternal Grandmother   ? ? ?Allergies  ?Allergen Reactions  ? Lisinopril Cough  ? ? ?Health Maintenance  ?Topic Date Due  ? OPHTHALMOLOGY EXAM  03/17/2017  ? FOOT EXAM  04/27/2017  ? COLONOSCOPY (Pts 45-54yr Insurance coverage will need to be confirmed)  Never done  ? URINE MICROALBUMIN  11/24/2019  ? HEMOGLOBIN A1C  01/12/2020  ? COVID-19 Vaccine (2 - Pfizer risk series) 07/08/2021  ? TETANUS/TDAP  05/10/2030  ? Hepatitis C Screening  Completed  ? HIV Screening  Completed  ? HPV VACCINES  Aged Out  ? ? ?Objective: ? ?Vitals:  ? 06/17/21 1056  ?BP: 106/72  ?Pulse: 95  ?Resp: 16  ?Temp: 97.9 ?F (36.6 ?C)  ?SpO2: 96%  ?Weight: 166 lb (75.3 kg)  ?Height: '5\' 9"'  (1.753 m)  ? ?Body mass index is 24.51 kg/m?. ? ?Physical Exam ?Constitutional:   ?   Comments: She is in no acute distress but appears worried.  ?Cardiovascular:  ?   Rate and Rhythm: Regular rhythm. Tachycardia present.  ?   Heart sounds: No murmur heard. ?Pulmonary:  ?   Effort: Pulmonary effort is normal.  ?   Breath sounds: Normal breath sounds.  ?Abdominal:  ?   Palpations: Abdomen is soft.  ?   Tenderness: There is no abdominal tenderness.  ?Psychiatric:     ?   Mood and Affect: Mood normal.  ? ? ?Lab Results ?Lab Results  ?Component Value Date  ? WBC 6.5 10/16/2020  ? HGB 13.8 10/16/2020  ? HCT 39.7 10/16/2020  ? MCV 92.3 10/16/2020  ? PLT 272 10/16/2020  ?  ?Lab Results  ?Component Value Date  ? CREATININE 1.25 10/16/2020  ? BUN 10 10/16/2020  ? NA 139 10/16/2020  ? K 4.6 10/16/2020  ? CL 104 10/16/2020  ? CO2 31 10/16/2020  ?  ?Lab Results  ?Component Value Date  ? ALT 18 10/16/2020  ? AST 78 (H) 10/16/2020  ? ALKPHOS 93  02/09/2018  ? BILITOT 0.5 10/16/2020  ?  ?Lab Results  ?Component Value Date  ? CHOL 110 10/16/2020  ? HDL 29 (L) 10/16/2020  ? LE[place]66 10/16/2020  ? TRIG 71 10/16/2020  ? CHOLHDL 3.8 10/16/2020  ? ?Lab Results  ?Component Value Date  ? LABRPR REACTIVE (A) 10/16/2020  ? RPRTITER 1:2 (H) 10/16/2020  ? ?HIV 1 RNA Quant  ?Date Value  ?10/16/2020 Not Detected Copies/mL  ?10/11/2019 <20 DETECTED copies/mL (A)  ?04/17/2019 <20 NOT DETECTED copies/mL  ? ?CD4 T Cell  Abs (/uL)  ?Date Value  ?10/16/2020 560  ?10/11/2019 754  ?04/17/2019 593  ? ?  ?Problem List Items Addressed This Visit   ? ?  ? High  ? Human immunodeficiency virus (HIV) disease ([place])  ?  Her infection has been under excellent, long-term control.  She will get repeat lab work today, continue [place] and follow-up by phone in 2 weeks to review her results. ?  ?  ? Relevant Medications  ? bictegravir-emtricitabine-tenofovir AF (BIKTARVY) 50-200-25 MG TABS tablet  ? Other Relevant Orders  ? T-helper cells (CD4) count (not at [facility])  ? HIV-1 RNA quant-no reflex-bld  ? CBC  ? Comprehensive metabolic panel  ? RPR  ? Lipid panel  ? Urine cytology ancillary only  ?  ? Medium   ? Depression  ?  Her depression is in remission. ?  ?  ?  ? Unprioritized  ? GERD (gastroesophageal reflux disease)  ?  I will start her back on Protonix. ?  ?  ? Relevant Medications  ? pantoprazole (PROTONIX) 20 MG tablet  ? ?Other Visit Diagnoses   ? ? Need for immunization against influenza    -  Primary  ? Relevant Orders  ? Flu Vaccine QUAD 52moIM (Fluarix, Fluzone & Alfiuria Quad PF) (Completed)  ? ?  ? ? ? ? ?JMichel Bickers MD ?[facility] ?[facility] ?[phone]   [phone] ?06/17/2021, 11:29 AM ? ?

## 2021-06-17 NOTE — Addendum Note (Signed)
Addended by: Lin Landsman E on: 06/17/2021 11:48 AM ? ? Modules accepted: Orders ? ?

## 2021-06-18 LAB — T-HELPER CELL (CD4) - (RCID CLINIC ONLY)
CD4 % Helper T Cell: 35 % (ref 33–65)
CD4 T Cell Abs: 542 /uL (ref 400–1790)

## 2021-06-18 LAB — URINE CYTOLOGY ANCILLARY ONLY
Chlamydia: NEGATIVE
Comment: NEGATIVE
Comment: NORMAL
Neisseria Gonorrhea: NEGATIVE

## 2021-06-20 LAB — COMPREHENSIVE METABOLIC PANEL
AG Ratio: 0.5 (calc) — ABNORMAL LOW (ref 1.0–2.5)
ALT: 17 U/L (ref 9–46)
AST: 59 U/L — ABNORMAL HIGH (ref 10–40)
Albumin: 2.4 g/dL — ABNORMAL LOW (ref 3.6–5.1)
Alkaline phosphatase (APISO): 95 U/L (ref 36–130)
BUN/Creatinine Ratio: 10 (calc) (ref 6–22)
BUN: 15 mg/dL (ref 7–25)
CO2: 28 mmol/L (ref 20–32)
Calcium: 7.9 mg/dL — ABNORMAL LOW (ref 8.6–10.3)
Chloride: 99 mmol/L (ref 98–110)
Creat: 1.49 mg/dL — ABNORMAL HIGH (ref 0.60–1.29)
Globulin: 4.7 g/dL (calc) — ABNORMAL HIGH (ref 1.9–3.7)
Glucose, Bld: 87 mg/dL (ref 65–99)
Potassium: 4.2 mmol/L (ref 3.5–5.3)
Sodium: 134 mmol/L — ABNORMAL LOW (ref 135–146)
Total Bilirubin: 0.4 mg/dL (ref 0.2–1.2)
Total Protein: 7.1 g/dL (ref 6.1–8.1)

## 2021-06-20 LAB — CBC
HCT: 34.9 % — ABNORMAL LOW (ref 38.5–50.0)
Hemoglobin: 11.9 g/dL — ABNORMAL LOW (ref 13.2–17.1)
MCH: 31.6 pg (ref 27.0–33.0)
MCHC: 34.1 g/dL (ref 32.0–36.0)
MCV: 92.8 fL (ref 80.0–100.0)
MPV: 9.9 fL (ref 7.5–12.5)
Platelets: 279 10*3/uL (ref 140–400)
RBC: 3.76 10*6/uL — ABNORMAL LOW (ref 4.20–5.80)
RDW: 13.6 % (ref 11.0–15.0)
WBC: 11.1 10*3/uL — ABNORMAL HIGH (ref 3.8–10.8)

## 2021-06-20 LAB — HIV-1 RNA QUANT-NO REFLEX-BLD
HIV 1 RNA Quant: NOT DETECTED Copies/mL
HIV-1 RNA Quant, Log: NOT DETECTED Log cps/mL

## 2021-06-20 LAB — RPR TITER: RPR Titer: 1:2 {titer} — ABNORMAL HIGH

## 2021-06-20 LAB — LIPID PANEL
Cholesterol: 79 mg/dL (ref <200)
HDL: 17 mg/dL — ABNORMAL LOW (ref >=40)
LDL Cholesterol (Calc): 46 mg/dL (calc)
Non-HDL Cholesterol (Calc): 62 mg/dL (calc) (ref <130)
Total CHOL/HDL Ratio: 4.6 (calc) (ref <5.0)
Triglycerides: 82 mg/dL (ref <150)

## 2021-06-20 LAB — FLUORESCENT TREPONEMAL AB(FTA)-IGG-BLD: Fluorescent Treponemal ABS: REACTIVE — AB

## 2021-06-20 LAB — RPR: RPR Ser Ql: REACTIVE — AB

## 2021-07-01 ENCOUNTER — Encounter: Payer: Self-pay | Admitting: Internal Medicine

## 2021-07-01 ENCOUNTER — Other Ambulatory Visit: Payer: Self-pay

## 2021-07-01 ENCOUNTER — Ambulatory Visit (INDEPENDENT_AMBULATORY_CARE_PROVIDER_SITE_OTHER): Payer: Medicare Other | Admitting: Internal Medicine

## 2021-07-01 DIAGNOSIS — J302 Other seasonal allergic rhinitis: Secondary | ICD-10-CM | POA: Diagnosis not present

## 2021-07-01 MED ORDER — LEVOCETIRIZINE DIHYDROCHLORIDE 5 MG PO TABS: 5.0000 mg | ORAL_TABLET | Freq: Every evening | ORAL | 3 refills | Status: AC

## 2021-07-01 MED ORDER — ALBUTEROL SULFATE HFA 108 (90 BASE) MCG/ACT IN AERS: 2.0000 | INHALATION_SPRAY | Freq: Four times a day (QID) | RESPIRATORY_TRACT | 3 refills | Status: AC | PRN

## 2021-07-01 NOTE — Progress Notes (Signed)
Virtual Visit via Telephone Note ? ?I connected with Robert Dawson on 07/01/21 at 10:15 AM EDT by telephone and verified that I am speaking with the correct person using two identifiers. ? ?Location: ?Patient: Home ?Provider: [facility] ?  ?I discussed the limitations, risks, security and privacy concerns of performing an evaluation and management service by telephone and the availability of in person appointments. I also discussed with the patient that there may be a patient responsible charge related to this service. The patient expressed understanding and agreed to proceed. ? ? ?History of Present Illness: ?I called and spoke with Robert Dawson.  She is feeling a little bit better.  She is no longer having urinary frequency or diarrhea.  She is still thirsty.  She says that her appetite is fairly good and unchanged recently.  She is not trying to lose any more weight.  She has bothered by her seasonal allergies and is not on her inhaler or her Xyzal.  She has not been missing doses of her Biktarvy. ?  ?Observations/Objective: ?HIV 1 RNA Quant  ?Date Value  ?06/17/2021 Not Detected Copies/mL  ?10/16/2020 Not Detected Copies/mL  ?10/11/2019 <20 DETECTED copies/mL (A)  ? ?CD4 T Cell Abs (/uL)  ?Date Value  ?06/17/2021 542  ?10/16/2020 560  ?10/11/2019 754  ? CMP  ?   ?Component Value Date/Time  ? NA 134 (L) 06/17/2021 1142  ? NA 137 02/09/2018 1506  ? K 4.2 06/17/2021 1142  ? CL 99 06/17/2021 1142  ? CO2 28 06/17/2021 1142  ? GLUCOSE 87 06/17/2021 1142  ? BUN 15 06/17/2021 1142  ? BUN 17 02/09/2018 1506  ? CREATININE 1.49 (H) 06/17/2021 1142  ? CALCIUM 7.9 (L) 06/17/2021 1142  ? PROT 7.1 06/17/2021 1142  ? PROT 7.8 02/09/2018 1506  ? ALBUMIN 4.2 02/09/2018 1506  ? AST 59 (H) 06/17/2021 1142  ? ALT 17 06/17/2021 1142  ? ALKPHOS 93 02/09/2018 1506  ? BILITOT 0.4 06/17/2021 1142  ? BILITOT 0.2 02/09/2018 1506  ? GFRNONAA 74 02/09/2018 1506  ? GFRNONAA 85 10/20/2015 0923  ? GFRAA 86 02/09/2018 1506  ? GFRAA >89 10/20/2015  7001  ?  ?Lab Results  ?Component Value Date  ? WBC 11.1 (H) 06/17/2021  ? HGB 11.9 (L) 06/17/2021  ? HCT 34.9 (L) 06/17/2021  ? MCV 92.8 06/17/2021  ? PLT 279 06/17/2021  ?  ? ?Assessment and Plan: ?Her HIV infection remains under excellent, long-term control. ? ?I think she became somewhat dehydrated recently due to diarrhea and urinary frequency.  I suggested that she continue to push fluids.  I also asked her to weigh herself once weekly to see if she is continuing to lose weight. ? ?Follow Up Instructions: ?Continue Biktarvy and follow-up here in 3 months ?I encouraged her to arrange follow-up with her PCP ?  ?I discussed the assessment and treatment plan with the patient. The patient was provided an opportunity to ask questions and all were answered. The patient agreed with the plan and demonstrated an understanding of the instructions. ?  ?The patient was advised to call back or seek an in-person evaluation if the symptoms worsen or if the condition fails to improve as anticipated. ? ?I provided 17 minutes of non-face-to-face time during this encounter. ? ? ?Michel Bickers, MD ? ?

## 2021-07-24 ENCOUNTER — Other Ambulatory Visit: Payer: Self-pay | Admitting: Internal Medicine

## 2021-08-14 ENCOUNTER — Encounter: Payer: Self-pay | Admitting: Student

## 2021-08-14 ENCOUNTER — Ambulatory Visit (INDEPENDENT_AMBULATORY_CARE_PROVIDER_SITE_OTHER): Payer: Medicare Other | Admitting: Student

## 2021-08-14 VITALS — BP 114/70 | HR 74 | Temp 97.7°F | Ht 68.0 in | Wt 159.8 lb

## 2021-08-14 DIAGNOSIS — D860 Sarcoidosis of lung: Secondary | ICD-10-CM

## 2021-08-14 DIAGNOSIS — R053 Chronic cough: Secondary | ICD-10-CM

## 2021-08-14 DIAGNOSIS — J189 Pneumonia, unspecified organism: Secondary | ICD-10-CM | POA: Diagnosis not present

## 2021-08-14 LAB — BASIC METABOLIC PANEL
BUN: 13 mg/dL (ref 6–23)
CO2: 29 mEq/L (ref 19–32)
Calcium: 8.3 mg/dL — ABNORMAL LOW (ref 8.4–10.5)
Chloride: 101 mEq/L (ref 96–112)
Creatinine, Ser: 0.88 mg/dL (ref 0.40–1.50)
GFR: 102.53 mL/min (ref >60.00)
Glucose, Bld: 103 mg/dL — ABNORMAL HIGH (ref 70–99)
Potassium: 4.3 mEq/L (ref 3.5–5.1)
Sodium: 136 mEq/L (ref 135–145)

## 2021-08-14 LAB — HEPATIC FUNCTION PANEL
ALT: 15 U/L (ref 0–53)
AST: 32 U/L (ref 0–37)
Albumin: 2.5 g/dL — ABNORMAL LOW (ref 3.5–5.2)
Alkaline Phosphatase: 102 U/L (ref 39–117)
Bilirubin, Direct: 0 mg/dL (ref 0.0–0.3)
Total Bilirubin: 0.2 mg/dL (ref 0.2–1.2)
Total Protein: 7.6 g/dL (ref 6.0–8.3)

## 2021-08-14 LAB — CBC WITH DIFFERENTIAL/PLATELET
Basophils Absolute: 0 10*3/uL (ref 0.0–0.1)
Basophils Relative: 0.2 % (ref 0.0–3.0)
Eosinophils Absolute: 0 10*3/uL (ref 0.0–0.7)
Eosinophils Relative: 0 % (ref 0.0–5.0)
HCT: 32.1 % — ABNORMAL LOW (ref 39.0–52.0)
Hemoglobin: 11 g/dL — ABNORMAL LOW (ref 13.0–17.0)
Lymphocytes Relative: 11.5 % — ABNORMAL LOW (ref 12.0–46.0)
Lymphs Abs: 1.8 10*3/uL (ref 0.7–4.0)
MCHC: 34.2 g/dL (ref 30.0–36.0)
MCV: 94.6 fl (ref 78.0–100.0)
Monocytes Absolute: 1.3 10*3/uL — ABNORMAL HIGH (ref 0.1–1.0)
Monocytes Relative: 8.3 % (ref 3.0–12.0)
Neutro Abs: 12.7 10*3/uL — ABNORMAL HIGH (ref 1.4–7.7)
Neutrophils Relative %: 80 % — ABNORMAL HIGH (ref 43.0–77.0)
Platelets: 369 10*3/uL (ref 150.0–400.0)
RBC: 3.4 Mil/uL — ABNORMAL LOW (ref 4.22–5.81)
RDW: 14.4 % (ref 11.5–15.5)
WBC: 15.9 10*3/uL — ABNORMAL HIGH (ref 4.0–10.5)

## 2021-08-14 MED ORDER — PREDNISONE 10 MG PO TABS: ORAL_TABLET | ORAL | 0 refills | Status: AC

## 2021-08-14 NOTE — Patient Instructions (Signed)
-   Extended prednisone taper which should last at least until CT Chest - Call [phone] or send my chart message once your CT Chest is done or if you're feeling worse as steroid dose is tapered. - labs today - see you in 8 weeks in clinic with breathing tests

## 2021-08-14 NOTE — Progress Notes (Signed)
Synopsis: Referred for 'breathing issues' by No ref. provider found  Subjective:   PATIENT ID: Robert Dawson GENDER: transgender male DOB: [birth year], MRN: [id number]  Chief Complaint  Patient presents with   Pulmonary Consult    CP approx 3 wks ago- went to ED and had CXR- was advised had "spot on the lung". CP she had is now resolved. She has had cough with occ bloody sputum over the past month.    46yF with history of HIV, DM, allergy, sarcoid (lower lip bx 2010 with granulomatous inflammation) referred for breathing issues. Previously followed by Dr. Annamaria Boots on chronic prednisone.  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   Seen in ED at [facility] 4/30 for CAP with left perihilar infiltrate and prescirbed augmentin and doxy, then back to urgent care 5/17 for persistent cough, prescribed prednisone. She doesn't really feel like she improved after the augmentin and doxy.   She has been on prednisone 40 mg daily for 3 days. Some but limited improvement. Still has some productive cough with occasional bloody streaks. No fever. A little DOE.  She has never been on any other immunosuppression for sarcoidosis.   Otherwise pertinent review of systems is negative.  Never smoker. No MJ/vaping.   Past Medical History:  Diagnosis Date   Allergy    Depression    Diabetes ([place]) 10/03/2015   HIV disease ([place])    Immune deficiency disorder ([place])      Family History  Problem Relation Age of Onset   Diabetes Maternal Grandmother    Hypertension Maternal Grandmother      Past Surgical History:  Procedure Laterality Date   GASTRIC ROUX-EN-Y     Silicone injections Bilateral y-18   Breasts and hips- black market   VIDEO BRONCHOSCOPY Bilateral 08/06/2014   Procedure: VIDEO BRONCHOSCOPY WITH FLUORO;  Surgeon: [name], MD;  Location: [facility];  Service: Endoscopy;  Laterality: Bilateral;    Social  History   Socioeconomic History   Marital status: Single    Spouse name: Not on file   Number of children: Not on file   Years of education: Not on file   Highest education level: Not on file  Occupational History   Not on file  Tobacco Use   Smoking status: Never   Smokeless tobacco: Never  Vaping Use   Vaping Use: Never used  Substance and Sexual Activity   Alcohol use: No    Alcohol/week: 0.0 standard drinks   Drug use: No   Sexual activity: Not Currently    Partners: Male, Male    Birth control/protection: Condom  Other Topics Concern   Not on file  Social History Narrative   Not on file   Social Determinants of Health   Financial Resource Strain: Not on file  Food Insecurity: Not on file  Transportation Needs: Not on file  Physical Activity: Not on file  Stress: Not on file  Social Connections: Not on file  Intimate Partner Violence: Not on file     Allergies  Allergen Reactions   Lisinopril Cough     Outpatient Medications Prior to Visit  Medication Sig Dispense Refill   albuterol (PROVENTIL) (2.5 MG/3ML) 0.083% nebulizer solution USE 1 VIAL IN NEBULIZER EVERY 4 HOURS 180 mL 11   albuterol (VENTOLIN HFA) 108 (90 Base) MCG/ACT inhaler Inhale 2 puffs into the lungs every 6 (six) hours as needed for wheezing or shortness  of breath. 8 g 3   bictegravir-emtricitabine-tenofovir AF (BIKTARVY) 50-200-25 MG TABS tablet Take 1 tablet by mouth daily. 30 tablet 11   blood glucose meter kit and supplies KIT Dispense based on patient and insurance preference. Use up to four times daily as directed. (FOR ICD-9 250.00, 250.01). 1 each 0   Cholecalciferol 50 MCG (2000 UT) TABS Take by mouth.     levocetirizine (XYZAL) 5 MG tablet Take 1 tablet (5 mg total) by mouth every evening. 30 tablet 3   levothyroxine (SYNTHROID) 100 MCG tablet      Multiple Vitamin (MULTIVITAMIN) tablet Take by mouth.     omeprazole (PRILOSEC) 40 MG capsule Take 1 capsule (40 mg total) by mouth  daily. 90 capsule 0   pantoprazole (PROTONIX) 20 MG tablet Take 1 tablet (20 mg total) by mouth daily. (Patient not taking: Reported on 07/01/2021) 30 tablet 11   ursodiol (ACTIGALL) 300 MG capsule Take 300 mg by mouth 2 (two) times daily. (Patient not taking: Reported on 10/16/2020)     No facility-administered medications prior to visit.       Objective:   Physical Exam:  General appearance: 46 y.o., adult, NAD, conversant  Eyes: anicteric sclerae; PERRL, tracking appropriately HENT: NCAT; MMM Neck: Trachea midline; no lymphadenopathy, no JVD Lungs: CTAB, no crackles, no wheeze, with normal respiratory effort CV: RRR, no murmur  Abdomen: Soft, non-tender; non-distended, BS present  Extremities: No peripheral edema, warm Skin: Normal turgor and texture; no rash Psych: Appropriate affect Neuro: Alert and oriented to person and place, no focal deficit     Vitals:   08/14/21 1436  BP: 114/70  Pulse: 74  Temp: 97.7 F (36.5 C)  TempSrc: Oral  SpO2: 97%  Weight: 159 lb 12.8 oz (72.5 kg)  Height: '5\' 8"'  (1.727 m)   97% on RA BMI Readings from Last 3 Encounters:  08/14/21 24.30 kg/m  06/17/21 24.51 kg/m  10/16/20 28.06 kg/m   Wt Readings from Last 3 Encounters:  08/14/21 159 lb 12.8 oz (72.5 kg)  06/17/21 166 lb (75.3 kg)  10/16/20 190 lb (86.2 kg)     CBC    Component Value Date/Time   WBC 11.1 (H) 06/17/2021 1142   RBC 3.76 (L) 06/17/2021 1142   HGB 11.9 (L) 06/17/2021 1142   HGB 13.5 07/13/2019 1408   HCT 34.9 (L) 06/17/2021 1142   HCT 39.7 07/13/2019 1408   PLT 279 06/17/2021 1142   PLT 264 07/13/2019 1408   MCV 92.8 06/17/2021 1142   MCV 92 07/13/2019 1408   MCH 31.6 06/17/2021 1142   MCHC 34.1 06/17/2021 1142   RDW 13.6 06/17/2021 1142   RDW 13.3 07/13/2019 1408   LYMPHSABS 2.4 07/13/2019 1408   MONOABS 0.7 09/02/2016 1126   EOSABS 0.0 07/13/2019 1408   BASOSABS 0.0 07/13/2019 1408      Chest Imaging: CTA Chest 2015 reviewed by me with  multifocal nodular consolidation, hilar/mediastinal LAD  CXR 2019 reviewed by me unremarkable  Pulmonary Functions Testing Results:    Latest Ref Rng & Units 12/09/2016   11:53 AM 10/25/2013   12:19 PM  PFT Results  FVC-Pre L 3.69   3.04    FVC-Predicted Pre % 88   71    FVC-Post L 3.63   3.13    FVC-Predicted Post % 86   73    Pre FEV1/FVC % % 72   59    Post FEV1/FCV % % 74   59    FEV1-Pre L  2.65   1.81    FEV1-Predicted Pre % 77   51    FEV1-Post L 2.69   1.86    DLCO uncorrected ml/min/mmHg 25.23   27.83    DLCO UNC% % 81   89    DLCO corrected ml/min/mmHg 25.45     DLCO COR %Predicted % 82     DLVA Predicted % 116   123    TLC L 5.14   5.59    TLC % Predicted % 76   83    RV % Predicted % 85   117     Spiro today reviewed by me with obstruction but improved FEV1 (72%), improved FVC (88%)  PFTs 2018 reviewed by me with mild mixed obstruction and restriction      Assessment & Plan:   # Sarcoidosis # Recent L perihilar CAP Concern for relapsed disease. L lingula was area of active disease in past with endobronchial biopsy there revealing granulomatous inflammation.  Plan: - extended steroid taper - HRCT Chest - PFTs next visit - labs today in case we need steroid sparing agent in addition to prednisone   RTC 8 weeks   Robert Hurter, MD [facility] 08/14/2021 2:46 PM

## 2021-08-17 LAB — ANGIOTENSIN CONVERTING ENZYME: Angiotensin-Converting Enzyme: 131 U/L — ABNORMAL HIGH (ref 9–67)

## 2021-08-17 LAB — HEPATITIS B SURFACE ANTIGEN: Hepatitis B Surface Ag: NONREACTIVE

## 2021-08-17 LAB — HEPATITIS B SURFACE ANTIBODY, QUANTITATIVE: Hep B S AB Quant (Post): 19 m[IU]/mL (ref >=10)

## 2021-08-17 LAB — HEPATITIS C ANTIBODY
Hepatitis C Ab: NONREACTIVE
SIGNAL TO CUT-OFF: 0.39 (ref <1.00)

## 2021-08-17 LAB — HEPATITIS B CORE ANTIBODY, TOTAL: Hep B Core Total Ab: REACTIVE — AB

## 2021-08-19 LAB — QUANTIFERON-TB GOLD PLUS
Mitogen-NIL: 0.44 IU/mL
NIL: 0.05 IU/mL
QuantiFERON-TB Gold Plus: UNDETERMINED — AB
TB1-NIL: 0 IU/mL
TB2-NIL: 0.01 IU/mL

## 2021-08-28 ENCOUNTER — Other Ambulatory Visit: Payer: Medicare Other

## 2021-08-31 ENCOUNTER — Telehealth: Payer: Self-pay | Admitting: Student

## 2021-09-01 MED ORDER — PREDNISONE 5 MG PO TABS: 5.0000 mg | ORAL_TABLET | Freq: Every day | ORAL | 2 refills | Status: AC

## 2021-09-01 NOTE — Telephone Encounter (Signed)
Prednisone 10 mg daily prescribed until next visit - if her symptoms are controlled on 5 mg daily she can decrease to 5 mg daily.

## 2021-09-01 NOTE — Telephone Encounter (Signed)
She reports that she is having some dry cough and it was doing better when she is on the prednisone. She has a follow up on 10/16/21 and wants a few more prednisone. Please advise.

## 2021-09-02 NOTE — Telephone Encounter (Signed)
Called patient and advised her that Dr Verlee Monte was placed her on Prednisone '10mg'$  until her next visit with him next month. And that if her symptoms are controlled on '5mg'$  she can drop down to '5mg'$  daily. Patient verbalized understanding. Nothing further needed

## 2021-09-18 ENCOUNTER — Other Ambulatory Visit: Payer: Medicare Other

## 2021-09-22 ENCOUNTER — Ambulatory Visit: Payer: Medicare Other | Admitting: Internal Medicine

## 2021-09-22 DIAGNOSIS — G939 Disorder of brain, unspecified: Secondary | ICD-10-CM | POA: Insufficient documentation

## 2021-09-22 DIAGNOSIS — G060 Intracranial abscess and granuloma: Secondary | ICD-10-CM | POA: Insufficient documentation

## 2021-10-15 ENCOUNTER — Telehealth: Payer: Self-pay | Admitting: Internal Medicine

## 2021-10-15 NOTE — Progress Notes (Deleted)
Synopsis: Referred for 'breathing issues' by No ref. provider found  Subjective:   PATIENT ID: [name] GENDER: transgender male DOB: 06-30-1975, MRN: [id number]  No chief complaint on file.  71yF with history of HIV, DM, allergy, sarcoid (lower lip bx 2010 with granulomatous inflammation) referred for breathing issues. Previously followed by Dr. Annamaria Boots on chronic prednisone.  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   Seen in ED at [facility] 4/30 for CAP with left perihilar infiltrate and prescirbed augmentin and doxy, then back to urgent care 5/17 for persistent cough, prescribed prednisone. She doesn't really feel like she improved after the augmentin and doxy.   She has been on prednisone 40 mg daily for 3 days. Some but limited improvement. Still has some productive cough with occasional bloody streaks. No fever. A little DOE.  She has never been on any other immunosuppression for sarcoidosis.   Otherwise pertinent review of systems is negative.  Never smoker. No MJ/vaping.   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    Past Medical History:  Diagnosis Date   Allergy    Depression    Diabetes ([place]) 10/03/2015   HIV disease ([place])    Immune deficiency disorder ([place])      Family History  Problem Relation Age of Onset   Diabetes Maternal Grandmother    Hypertension Maternal Grandmother      Past Surgical History:  Procedure Laterality Date   GASTRIC ROUX-EN-Y     Silicone injections Bilateral y-18   Breasts and hips- black market   VIDEO BRONCHOSCOPY Bilateral 08/06/2014   Procedure: VIDEO BRONCHOSCOPY WITH FLUORO;  Surgeon: [name], MD;  Location: [facility];  Service: Endoscopy;  Laterality: Bilateral;    Social History   Socioeconomic History   Marital status: Single     Spouse name: Not on file   Number of children: Not on file   Years of education: Not on file   Highest education level: Not on file  Occupational History   Not on file  Tobacco Use   Smoking status: Never   Smokeless tobacco: Never  Vaping Use   Vaping Use: Never used  Substance and Sexual Activity   Alcohol use: No    Alcohol/week: 0.0 standard drinks of alcohol   Drug use: No   Sexual activity: Not Currently    Partners: Male, Male    Birth control/protection: Condom  Other Topics Concern   Not on file  Social History Narrative   Not on file   Social Determinants of Health   Financial Resource Strain: Not on file  Food Insecurity: Not on file  Transportation Needs: Not on file  Physical Activity: Not on file  Stress: Not on file  Social Connections: Not on file  Intimate Partner Violence: Not on file     Allergies  Allergen Reactions   Lisinopril Cough     Outpatient Medications Prior to Visit  Medication Sig Dispense Refill   albuterol (PROVENTIL) (2.5 MG/3ML) 0.083% nebulizer solution USE 1 VIAL IN NEBULIZER EVERY 4 HOURS 180 mL 11   albuterol (VENTOLIN HFA) 108 (90 Base) MCG/ACT inhaler Inhale 2 puffs into the lungs every 6 (six) hours as needed for wheezing or shortness of breath. 8 g 3   bictegravir-emtricitabine-tenofovir AF (BIKTARVY) 50-200-25 MG TABS tablet Take 1 tablet by mouth  daily. 30 tablet 11   blood glucose meter kit and supplies KIT Dispense based on patient and insurance preference. Use up to four times daily as directed. (FOR ICD-9 250.00, 250.01). 1 each 0   Cholecalciferol 50 MCG (2000 UT) TABS Take by mouth.     levocetirizine (XYZAL) 5 MG tablet Take 1 tablet (5 mg total) by mouth every evening. 30 tablet 3   levothyroxine (SYNTHROID) 100 MCG tablet      Multiple Vitamin (MULTIVITAMIN) tablet Take by mouth.     predniSONE (DELTASONE) 10 MG tablet Take 4 tabs by mouth once daily x4 days, then 3 tabs x4 days, 2 tabs x4 days, 1 tab x4 days  and stop. 40 tablet 0   predniSONE (DELTASONE) 5 MG tablet Take 1 tablet (5 mg total) by mouth daily with breakfast. 60 tablet 2   No facility-administered medications prior to visit.       Objective:   Physical Exam:  General appearance: 46 y.o., adult, NAD, conversant  Eyes: anicteric sclerae; PERRL, tracking appropriately HENT: NCAT; MMM Neck: Trachea midline; no lymphadenopathy, no JVD Lungs: CTAB, no crackles, no wheeze, with normal respiratory effort CV: RRR, no murmur  Abdomen: Soft, non-tender; non-distended, BS present  Extremities: No peripheral edema, warm Skin: Normal turgor and texture; no rash Psych: Appropriate affect Neuro: Alert and oriented to person and place, no focal deficit     There were no vitals filed for this visit.    on RA BMI Readings from Last 3 Encounters:  08/14/21 24.30 kg/m  06/17/21 24.51 kg/m  10/16/20 28.06 kg/m   Wt Readings from Last 3 Encounters:  08/14/21 159 lb 12.8 oz (72.5 kg)  06/17/21 166 lb (75.3 kg)  10/16/20 190 lb (86.2 kg)     CBC    Component Value Date/Time   WBC 15.9 (H) 08/14/2021 1524   RBC 3.40 (L) 08/14/2021 1524   HGB 11.0 (L) 08/14/2021 1524   HGB 13.5 07/13/2019 1408   HCT 32.1 (L) 08/14/2021 1524   HCT 39.7 07/13/2019 1408   PLT 369.0 08/14/2021 1524   PLT 264 07/13/2019 1408   MCV 94.6 08/14/2021 1524   MCV 92 07/13/2019 1408   MCH 31.6 06/17/2021 1142   MCHC 34.2 08/14/2021 1524   RDW 14.4 08/14/2021 1524   RDW 13.3 07/13/2019 1408   LYMPHSABS 1.8 08/14/2021 1524   LYMPHSABS 2.4 07/13/2019 1408   MONOABS 1.3 (H) 08/14/2021 1524   EOSABS 0.0 08/14/2021 1524   EOSABS 0.0 07/13/2019 1408   BASOSABS 0.0 08/14/2021 1524   BASOSABS 0.0 07/13/2019 1408      Chest Imaging: CTA Chest 2015 reviewed by me with multifocal nodular consolidation, hilar/mediastinal LAD  CXR 2019 reviewed by me unremarkable  Pulmonary Functions Testing Results:    Latest Ref Rng & Units 12/09/2016   11:53  AM 10/25/2013   12:19 PM  PFT Results  FVC-Pre L 3.69  3.04   FVC-Predicted Pre % 88  71   FVC-Post L 3.63  3.13   FVC-Predicted Post % 86  73   Pre FEV1/FVC % % 72  59   Post FEV1/FCV % % 74  59   FEV1-Pre L 2.65  1.81   FEV1-Predicted Pre % 77  51   FEV1-Post L 2.69  1.86   DLCO uncorrected ml/min/mmHg 25.23  27.83   DLCO UNC% % 81  89   DLCO corrected ml/min/mmHg 25.45    DLCO COR %Predicted % 82    DLVA Predicted %  116  123   TLC L 5.14  5.59   TLC % Predicted % 76  83   RV % Predicted % 85  117    Spiro today reviewed by me with obstruction but improved FEV1 (72%), improved FVC (88%)  PFTs 2018 reviewed by me with mild mixed obstruction and restriction      Assessment & Plan:   # Sarcoidosis # Recent L perihilar CAP Concern for relapsed disease. L lingula was area of active disease in past with endobronchial biopsy there revealing granulomatous inflammation.  Plan: - extended steroid taper - HRCT Chest - PFTs next visit - labs today in case we need steroid sparing agent in addition to prednisone   RTC 8 weeks   Maryjane Hurter, MD [facility] 10/15/2021 5:30 PM

## 2021-10-15 NOTE — Telephone Encounter (Signed)
I called and spoke with Dr. Laurena Slimmer today about Robert Dawson's recent illnesses.  Apparently [name] was recently hospitalized at an outside hospital with pneumonia and treated with antibiotics and steroids.  After discharge she developed left-sided weakness and was readmitted to [place] in [place] on 09/13/2021.  Multiple brain scans have shown extensive ring-enhancing lesions.  She was treated empirically for possible bacterial brain abscess.  She is continued on her Biktarvy and her CD4 count remained well up in the normal limit of 700 with an undetectable viral load.  She was treated with broad empiric antibiotic therapy for possible bacterial brain abscess.  She is underwent brain biopsy on 10/01/2021.  No evidence of malignancy was found.  Pathologist reported seeing spirochetes.  There was also a lesion around her left adrenal gland that was biopsied.  A mold was isolated in culture which has not been identified.  She has been on posaconazole in the hospital.  She received 2 weeks of IV penicillin as well as 2 doses of benzathine penicillin.  Her follow-up MRIs have not shown dramatic improvement but she has improved clinically and is eager to be discharged.  I will follow-up with her as soon as she has out of the hospital.

## 2021-10-16 ENCOUNTER — Ambulatory Visit: Payer: Medicare Other | Admitting: Student

## 2021-10-21 ENCOUNTER — Ambulatory Visit: Payer: Medicare Other | Admitting: Internal Medicine

## 2021-10-25 ENCOUNTER — Other Ambulatory Visit: Payer: Self-pay | Admitting: Internal Medicine

## 2021-10-25 DIAGNOSIS — B20 Human immunodeficiency virus [HIV] disease: Secondary | ICD-10-CM

## 2021-10-29 ENCOUNTER — Telehealth: Payer: Self-pay

## 2021-10-29 NOTE — Telephone Encounter (Signed)
Left voicemail asking patient to return my call. Per Dr.Campbell - patient needs to be rescheduled on 8/8 due to being double booked. Patient will need to reschedule and needs 30 minute slot.    [place], CMA

## 2021-11-03 ENCOUNTER — Ambulatory Visit: Payer: Medicare Other | Admitting: Internal Medicine

## 2021-11-12 DIAGNOSIS — A523 Neurosyphilis, unspecified: Secondary | ICD-10-CM | POA: Insufficient documentation

## 2021-11-12 DIAGNOSIS — E278 Other specified disorders of adrenal gland: Secondary | ICD-10-CM | POA: Insufficient documentation

## 2021-11-17 ENCOUNTER — Ambulatory Visit: Payer: Medicare Other | Admitting: Internal Medicine

## 2021-11-17 DIAGNOSIS — A523 Neurosyphilis, unspecified: Secondary | ICD-10-CM

## 2021-11-17 DIAGNOSIS — E278 Other specified disorders of adrenal gland: Secondary | ICD-10-CM

## 2021-11-24 ENCOUNTER — Ambulatory Visit: Payer: Medicare Other | Admitting: Internal Medicine
